# Patient Record
Sex: Male | Born: 1970 | Race: Black or African American | Hispanic: No | Smoking: Current every day smoker
Health system: Southern US, Community
[De-identification: ages and names within clinical notes are randomized; demographics above are authoritative.]

## PROBLEM LIST (undated history)

## (undated) DIAGNOSIS — Z992 Dependence on renal dialysis: Secondary | ICD-10-CM

## (undated) DIAGNOSIS — E119 Type 2 diabetes mellitus without complications: Secondary | ICD-10-CM

## (undated) DIAGNOSIS — I10 Essential (primary) hypertension: Secondary | ICD-10-CM

## (undated) DIAGNOSIS — I2510 Atherosclerotic heart disease of native coronary artery without angina pectoris: Secondary | ICD-10-CM

## (undated) DIAGNOSIS — I472 Ventricular tachycardia, unspecified: Secondary | ICD-10-CM

## (undated) DIAGNOSIS — N186 End stage renal disease: Secondary | ICD-10-CM

## (undated) DIAGNOSIS — K219 Gastro-esophageal reflux disease without esophagitis: Secondary | ICD-10-CM

## (undated) DIAGNOSIS — Z95810 Presence of automatic (implantable) cardiac defibrillator: Secondary | ICD-10-CM

## (undated) DIAGNOSIS — I255 Ischemic cardiomyopathy: Secondary | ICD-10-CM

## (undated) DIAGNOSIS — R519 Headache, unspecified: Secondary | ICD-10-CM

## (undated) DIAGNOSIS — R51 Headache: Secondary | ICD-10-CM

## (undated) DIAGNOSIS — I499 Cardiac arrhythmia, unspecified: Secondary | ICD-10-CM

## (undated) DIAGNOSIS — Z8719 Personal history of other diseases of the digestive system: Secondary | ICD-10-CM

## (undated) DIAGNOSIS — N189 Chronic kidney disease, unspecified: Secondary | ICD-10-CM

## (undated) HISTORY — DX: Chronic kidney disease, unspecified: N18.9

## (undated) HISTORY — PX: KNEE SURGERY: SHX244

## (undated) HISTORY — DX: Type 2 diabetes mellitus without complications: E11.9

## (undated) HISTORY — DX: Essential (primary) hypertension: I10

## (undated) NOTE — *Deleted (*Deleted)
Patient seen and disucssed with PA Ahmed Prima, I agree with her documentation. 56 yo male history of CAD with CABG in 2019, ESRD, DM2, HTN, poor compliance admitted from dialysis with afib with RVR. Has not been taking his av nodal agents at home.    K 4.8 Cr 12 BUN 39 Mg 2 BNP 1898  hstrop 520--> COVID neg CXR no acute process EKG afib, RVR Echo pending   Admitted with afib with RVR in setting of medication noncompliance. Starting oral dilt 30mg  every 6 hours and follow rates, can consolidated to long acting closer to discharge. Also on coreg 12.5mg  bid. Continue eliquis for stroke prevention.    Elevated trop in setting of ESRD and afib with RVR

---

## 2010-03-26 ENCOUNTER — Emergency Department (HOSPITAL_COMMUNITY): Admission: EM | Admit: 2010-03-26 | Discharge: 2010-03-26 | Payer: Self-pay | Admitting: Emergency Medicine

## 2011-03-25 LAB — COMPREHENSIVE METABOLIC PANEL
ALT: 14 U/L (ref 0–53)
AST: 15 U/L (ref 0–37)
Alkaline Phosphatase: 53 U/L (ref 39–117)
BUN: 15 mg/dL (ref 6–23)
GFR calc Af Amer: >60 mL/min (ref >60)
Sodium: 135 mEq/L (ref 135–145)

## 2011-03-25 LAB — D-DIMER, QUANTITATIVE: D-Dimer, Quant: 0.46 ug/mL-FEU (ref 0.00–0.48)

## 2011-03-25 LAB — GLUCOSE, CAPILLARY: Glucose-Capillary: 363 mg/dL — ABNORMAL HIGH (ref 70–99)

## 2011-03-25 LAB — CBC
HCT: 33.7 % — ABNORMAL LOW (ref 39.0–52.0)
Hemoglobin: 11.6 g/dL — ABNORMAL LOW (ref 13.0–17.0)
MCHC: 34.4 g/dL (ref 30.0–36.0)
Platelets: 189 10*3/uL (ref 150–400)
RBC: 4.45 MIL/uL (ref 4.22–5.81)
RDW: 13.4 % (ref 11.5–15.5)

## 2011-03-25 LAB — POCT CARDIAC MARKERS
CKMB, poc: <1 ng/mL — ABNORMAL LOW (ref 1.0–8.0)
Myoglobin, poc: 68.4 ng/mL (ref 12–200)

## 2011-03-25 LAB — DIFFERENTIAL
Basophils Relative: 0 % (ref 0–1)
Eosinophils Relative: 3 % (ref 0–5)
Lymphocytes Relative: 30 % (ref 12–46)
Neutrophils Relative %: 60 % (ref 43–77)

## 2015-02-17 ENCOUNTER — Encounter: Payer: Self-pay | Admitting: Vascular Surgery

## 2015-02-17 ENCOUNTER — Other Ambulatory Visit: Payer: Self-pay | Admitting: *Deleted

## 2015-02-17 DIAGNOSIS — N186 End stage renal disease: Secondary | ICD-10-CM

## 2015-02-17 DIAGNOSIS — Z01810 Encounter for preprocedural cardiovascular examination: Secondary | ICD-10-CM

## 2015-03-08 ENCOUNTER — Encounter: Payer: Self-pay | Admitting: Vascular Surgery

## 2015-03-09 ENCOUNTER — Ambulatory Visit (INDEPENDENT_AMBULATORY_CARE_PROVIDER_SITE_OTHER): Payer: Self-pay | Admitting: Vascular Surgery

## 2015-03-09 ENCOUNTER — Other Ambulatory Visit: Payer: Self-pay

## 2015-03-09 ENCOUNTER — Ambulatory Visit (INDEPENDENT_AMBULATORY_CARE_PROVIDER_SITE_OTHER)
Admission: RE | Admit: 2015-03-09 | Discharge: 2015-03-09 | Disposition: A | Discharge status: Home / Self Care | Payer: Medicaid - Out of State | Source: Ambulatory Visit | Attending: Vascular Surgery | Admitting: Vascular Surgery

## 2015-03-09 ENCOUNTER — Ambulatory Visit (HOSPITAL_COMMUNITY)
Admission: RE | Admit: 2015-03-09 | Discharge: 2015-03-09 | Disposition: A | Discharge status: Home / Self Care | Payer: Medicaid - Out of State | Source: Ambulatory Visit | Attending: Vascular Surgery | Admitting: Vascular Surgery

## 2015-03-09 ENCOUNTER — Encounter: Payer: Self-pay | Admitting: Vascular Surgery

## 2015-03-09 VITALS — BP 189/93 | HR 112 | Resp 16 | Ht 70.0 in | Wt 199.0 lb

## 2015-03-09 DIAGNOSIS — N186 End stage renal disease: Secondary | ICD-10-CM

## 2015-03-09 DIAGNOSIS — Z992 Dependence on renal dialysis: Secondary | ICD-10-CM | POA: Insufficient documentation

## 2015-03-09 DIAGNOSIS — Z01810 Encounter for preprocedural cardiovascular examination: Secondary | ICD-10-CM

## 2015-03-09 NOTE — Progress Notes (Signed)
[facility] [place] HISTORY AND PHYSICAL   History of Present Illness:  Patient is a 44 y.o. male who presents for placement of a permanent hemodialysis access. The patient is right handed.  The patient is currently on hemodialysis via a right-sided catheter.  The cause of renal failure is thought to be secondary to diabetes and hypertension.  These are both currently stable. He has had no other prior permanent hemodialysis access procedures. He does have some occasional right hand numbness currently.  Past Medical History  Diagnosis Date  . Chronic kidney disease   . Hypertension   . Diabetes mellitus without complication     Past Surgical History  Procedure Laterality Date  . Knee surgery       Social History History  Substance Use Topics  . Smoking status: Current Every Day Smoker  . Smokeless tobacco: Never Used  . Alcohol Use: 0.0 oz/week    0 Standard drinks or equivalent per week    Family History Family History  Problem Relation Age of Onset  . Hypertension Father   . Stroke Father   . Diabetes Mother     Allergies  No Known Allergies   No current outpatient prescriptions on file.   No current facility-administered medications for this visit.    ROS:   General:  No weight loss, Fever, chills  HEENT: No recent headaches, no nasal bleeding, no visual changes, no sore throat  Neurologic: No dizziness, blackouts, seizures. No recent symptoms of stroke or mini- stroke. No recent episodes of slurred speech, or temporary blindness.  Cardiac: No recent episodes of chest pain/pressure, no shortness of breath at rest.  No shortness of breath with exertion.  Denies history of atrial fibrillation or irregular heartbeat  Vascular: No history of rest pain in feet.  No history of claudication.  No history of non-healing ulcer, No history of DVT   Pulmonary: No home oxygen, no productive cough, no hemoptysis,  No asthma or  wheezing  Musculoskeletal:  [ ]  Arthritis, [ ]  Low back pain,  [ ]  Joint pain  Hematologic:No history of hypercoagulable state.  No history of easy bleeding.  No history of anemia  Gastrointestinal: No hematochezia or melena,  No gastroesophageal reflux, no trouble swallowing  Urinary: [ ]  chronic Kidney disease, [ ]  on HD - [ x] MWF or [ ]  TTHS, [ ]  Burning with urination, [ ]  Frequent urination, [ ]  Difficulty urinating;   Skin: No rashes  Psychological: No history of anxiety,  No history of depression   Physical Examination  Filed Vitals:   03/09/15 1437  BP: 189/93  Pulse: 112  Resp: 16  Height: 5\' 10"  (1.778 m)  Weight: 199 lb (90.266 kg)    Body mass index is 28.55 kg/(m^2).  General:  Alert and oriented, no acute distress HEENT: Normal Neck: No bruit or JVD Pulmonary: Clear to auscultation bilaterally Cardiac: Regular Rate and Rhythm without murmur Gastrointestinal: Soft, non-tender, non-distended, no mass Skin: No rash Extremity Pulses:  2+ radial, brachial pulses bilaterally Musculoskeletal: No deformity or edema  Neurologic: Upper and lower extremity motor 5/5 and symmetric  DATA: Patient had a vein mapping ultrasound today which showed thrombosis in the upper arm right cephalic vein basilic vein was 4-5 mm. On the left side there was a thrombosed area in the mid forearm of the cephalic vein antecubital area up was 4-5 mm diameter. Basilic vein on the left side was 4-5 mm. Arterial duplex was also performed which  showed triphasic waveforms throughout the brachial radial and ulnar arteries bilaterally. There is normal anatomic configuration.   ASSESSMENT: Patient needs long-term hemodialysis access. His anatomy is suitable for a left brachiocephalic AV fistula.   PLAN:  Left brachiocephalic AV fistula scheduled for 03/30/2015. Risks benefits possible complications and procedure details including but not limited to bleeding infection ischemic steal non-maturation  of the fistula works [place] the patient today. He understands and agrees to proceed.  Ruta Hinds, MD [facility] [place] Office: [phone] Pager: [phone]

## 2015-03-16 ENCOUNTER — Other Ambulatory Visit (HOSPITAL_COMMUNITY): Payer: Self-pay | Admitting: Internal Medicine

## 2015-03-16 DIAGNOSIS — M545 Low back pain, unspecified: Secondary | ICD-10-CM

## 2015-03-16 DIAGNOSIS — N08 Glomerular disorders in diseases classified elsewhere: Secondary | ICD-10-CM

## 2015-03-16 DIAGNOSIS — C9000 Multiple myeloma not having achieved remission: Secondary | ICD-10-CM

## 2015-03-29 ENCOUNTER — Encounter (HOSPITAL_COMMUNITY): Payer: Self-pay | Admitting: *Deleted

## 2015-03-29 MED ORDER — CHLORHEXIDINE GLUCONATE CLOTH 2 % EX PADS: 6.0000 | MEDICATED_PAD | Freq: Once | CUTANEOUS | Status: DC

## 2015-03-29 MED ORDER — DEXTROSE 5 % IV SOLN
1.5000 g | INTRAVENOUS | Status: AC
  Administered 2015-03-30: 1.5 g via INTRAVENOUS
  Filled 2015-03-29: qty 1.5

## 2015-03-29 MED ORDER — SODIUM CHLORIDE 0.9 % IV SOLN: INTRAVENOUS | Status: DC

## 2015-03-29 NOTE — Progress Notes (Signed)
   03/29/15 1256  OBSTRUCTIVE SLEEP APNEA  Have you ever been diagnosed with sleep apnea through a sleep study? No  Do you snore loudly (loud enough to be heard through closed doors)?  1  Do you often feel tired, fatigued, or sleepy during the daytime? 1  Has anyone observed you stop breathing during your sleep? 0  Do you have, or are you being treated for high blood pressure? 1  BMI more than 35 kg/m2? 0  Age over 43 years old? 0  Gender: 1  Obstructive Sleep Apnea Score 4

## 2015-03-29 NOTE — Progress Notes (Signed)
Pt consented for spouse Estill Bamberg to complete SDW-pre-op call. Please assess for SOB, chest pain and anesthesia complications on DOS. According to spouse, pt has no PCP. Pt spouse made aware to have pt not take any diabetic medications the morning of surgeryand to not take any otc vitamins, herbal medications and  NSAIDS.

## 2015-03-30 ENCOUNTER — Encounter (HOSPITAL_COMMUNITY)
Admission: RE | Disposition: A | Discharge status: Home / Self Care | Payer: Self-pay | Source: Ambulatory Visit | Attending: Vascular Surgery

## 2015-03-30 ENCOUNTER — Ambulatory Visit (HOSPITAL_COMMUNITY): Payer: MEDICAID | Admitting: Anesthesiology

## 2015-03-30 ENCOUNTER — Encounter (HOSPITAL_COMMUNITY): Payer: Self-pay | Admitting: Anesthesiology

## 2015-03-30 ENCOUNTER — Ambulatory Visit (HOSPITAL_COMMUNITY)
Admission: RE | Admit: 2015-03-30 | Discharge: 2015-03-30 | Disposition: A | Discharge status: Home / Self Care | Payer: MEDICAID | Source: Ambulatory Visit | Attending: Vascular Surgery | Admitting: Vascular Surgery

## 2015-03-30 DIAGNOSIS — E119 Type 2 diabetes mellitus without complications: Secondary | ICD-10-CM | POA: Insufficient documentation

## 2015-03-30 DIAGNOSIS — I120 Hypertensive chronic kidney disease with stage 5 chronic kidney disease or end stage renal disease: Secondary | ICD-10-CM | POA: Diagnosis not present

## 2015-03-30 DIAGNOSIS — K219 Gastro-esophageal reflux disease without esophagitis: Secondary | ICD-10-CM | POA: Insufficient documentation

## 2015-03-30 DIAGNOSIS — Z992 Dependence on renal dialysis: Secondary | ICD-10-CM | POA: Insufficient documentation

## 2015-03-30 DIAGNOSIS — F17210 Nicotine dependence, cigarettes, uncomplicated: Secondary | ICD-10-CM | POA: Diagnosis not present

## 2015-03-30 DIAGNOSIS — N185 Chronic kidney disease, stage 5: Secondary | ICD-10-CM

## 2015-03-30 DIAGNOSIS — I493 Ventricular premature depolarization: Secondary | ICD-10-CM | POA: Diagnosis not present

## 2015-03-30 HISTORY — DX: Headache, unspecified: R51.9

## 2015-03-30 HISTORY — DX: Headache: R51

## 2015-03-30 HISTORY — DX: Gastro-esophageal reflux disease without esophagitis: K21.9

## 2015-03-30 HISTORY — PX: AV FISTULA PLACEMENT: SHX1204

## 2015-03-30 LAB — POCT I-STAT 4, (NA,K, GLUC, HGB,HCT)
GLUCOSE: 119 mg/dL — AB (ref 70–99)
HCT: 38 % — ABNORMAL LOW (ref 39.0–52.0)
HEMOGLOBIN: 12.9 g/dL — AB (ref 13.0–17.0)
POTASSIUM: 3.1 mmol/L — AB (ref 3.5–5.1)
Sodium: 139 mmol/L (ref 135–145)

## 2015-03-30 LAB — GLUCOSE, CAPILLARY
GLUCOSE-CAPILLARY: 109 mg/dL — AB (ref 70–99)
Glucose-Capillary: 110 mg/dL — ABNORMAL HIGH (ref 70–99)

## 2015-03-30 SURGERY — ARTERIOVENOUS (AV) FISTULA CREATION
Anesthesia: Monitor Anesthesia Care | Site: Arm Upper | Laterality: Left

## 2015-03-30 MED ORDER — 0.9 % SODIUM CHLORIDE (POUR BTL) OPTIME
TOPICAL | Status: DC | PRN
  Administered 2015-03-30: 1000 mL

## 2015-03-30 MED ORDER — HYDROMORPHONE HCL 1 MG/ML IJ SOLN: 0.2500 mg | INTRAMUSCULAR | Status: DC | PRN

## 2015-03-30 MED ORDER — ONDANSETRON HCL 4 MG/2ML IJ SOLN
INTRAMUSCULAR | Status: AC
  Filled 2015-03-30: qty 2

## 2015-03-30 MED ORDER — MIDAZOLAM HCL 5 MG/5ML IJ SOLN
INTRAMUSCULAR | Status: DC | PRN
  Administered 2015-03-30 (×2): 1 mg via INTRAVENOUS

## 2015-03-30 MED ORDER — HYDRALAZINE HCL 20 MG/ML IJ SOLN
5.0000 mg | Freq: Once | INTRAMUSCULAR | Status: AC
  Administered 2015-03-30: 5 mg via INTRAVENOUS

## 2015-03-30 MED ORDER — OXYCODONE HCL 5 MG/5ML PO SOLN: 5.0000 mg | Freq: Once | ORAL | Status: DC | PRN

## 2015-03-30 MED ORDER — FENTANYL CITRATE 0.05 MG/ML IJ SOLN
INTRAMUSCULAR | Status: DC | PRN
  Administered 2015-03-30 (×3): 50 ug via INTRAVENOUS

## 2015-03-30 MED ORDER — MIDAZOLAM HCL 2 MG/2ML IJ SOLN
INTRAMUSCULAR | Status: AC
  Filled 2015-03-30: qty 2

## 2015-03-30 MED ORDER — HEPARIN SODIUM (PORCINE) 1000 UNIT/ML IJ SOLN
INTRAMUSCULAR | Status: AC
  Filled 2015-03-30: qty 1

## 2015-03-30 MED ORDER — LIDOCAINE HCL (PF) 1 % IJ SOLN
INTRAMUSCULAR | Status: AC
  Filled 2015-03-30: qty 30

## 2015-03-30 MED ORDER — PROPOFOL 10 MG/ML IV BOLUS
INTRAVENOUS | Status: AC
  Filled 2015-03-30: qty 20

## 2015-03-30 MED ORDER — PROPOFOL INFUSION 10 MG/ML OPTIME
INTRAVENOUS | Status: DC | PRN
  Administered 2015-03-30: 50 ug/kg/min via INTRAVENOUS

## 2015-03-30 MED ORDER — THROMBIN 20000 UNITS EX SOLR
CUTANEOUS | Status: AC
  Filled 2015-03-30: qty 20000

## 2015-03-30 MED ORDER — OXYCODONE HCL 5 MG PO TABS: 5.0000 mg | ORAL_TABLET | Freq: Once | ORAL | Status: DC | PRN

## 2015-03-30 MED ORDER — HYDRALAZINE HCL 20 MG/ML IJ SOLN
INTRAMUSCULAR | Status: DC | PRN
  Administered 2015-03-30 (×2): 5 mg via INTRAVENOUS

## 2015-03-30 MED ORDER — PROTAMINE SULFATE 10 MG/ML IV SOLN
INTRAVENOUS | Status: AC
  Filled 2015-03-30: qty 5

## 2015-03-30 MED ORDER — SODIUM CHLORIDE 0.9 % IR SOLN
Status: DC | PRN
  Administered 2015-03-30: 500 mL

## 2015-03-30 MED ORDER — DEXTROSE 5 % IV SOLN
INTRAVENOUS | Status: DC | PRN
  Administered 2015-03-30: 08:00:00 via INTRAVENOUS

## 2015-03-30 MED ORDER — PROMETHAZINE HCL 25 MG/ML IJ SOLN: 6.2500 mg | INTRAMUSCULAR | Status: DC | PRN

## 2015-03-30 MED ORDER — FENTANYL CITRATE 0.05 MG/ML IJ SOLN
INTRAMUSCULAR | Status: AC
  Filled 2015-03-30: qty 5

## 2015-03-30 MED ORDER — HEPARIN SODIUM (PORCINE) 1000 UNIT/ML IJ SOLN
INTRAMUSCULAR | Status: DC | PRN
  Administered 2015-03-30: 7000 [IU] via INTRAVENOUS

## 2015-03-30 MED ORDER — SODIUM CHLORIDE 0.9 % IV SOLN
INTRAVENOUS | Status: DC | PRN
  Administered 2015-03-30 (×2): via INTRAVENOUS

## 2015-03-30 MED ORDER — HYDRALAZINE HCL 20 MG/ML IJ SOLN
INTRAMUSCULAR | Status: AC
  Filled 2015-03-30: qty 1

## 2015-03-30 MED ORDER — ONDANSETRON HCL 4 MG/2ML IJ SOLN
INTRAMUSCULAR | Status: DC | PRN
  Administered 2015-03-30: 4 mg via INTRAVENOUS

## 2015-03-30 MED ORDER — LIDOCAINE HCL (PF) 1 % IJ SOLN
INTRAMUSCULAR | Status: DC | PRN
  Administered 2015-03-30: 18 mL

## 2015-03-30 MED ORDER — OXYCODONE HCL 5 MG PO TABS: 5.0000 mg | ORAL_TABLET | Freq: Four times a day (QID) | ORAL | Status: DC | PRN

## 2015-03-30 SURGICAL SUPPLY — 34 items
ARMBAND PINK RESTRICT EXTREMIT (MISCELLANEOUS) ×2 IMPLANT
CANISTER SUCTION 2500CC (MISCELLANEOUS) ×2 IMPLANT
CANNULA VESSEL 3MM 2 BLNT TIP (CANNULA) ×2 IMPLANT
CLIP TI MEDIUM 6 (CLIP) ×2 IMPLANT
CLIP TI WIDE RED SMALL 6 (CLIP) ×2 IMPLANT
COVER PROBE W GEL 5X96 (DRAPES) IMPLANT
DECANTER SPIKE VIAL GLASS SM (MISCELLANEOUS) ×2 IMPLANT
DRAIN PENROSE 1/4X12 LTX STRL (WOUND CARE) ×2 IMPLANT
ELECT REM PT RETURN 9FT ADLT (ELECTROSURGICAL) ×2
ELECTRODE REM PT RTRN 9FT ADLT (ELECTROSURGICAL) ×1 IMPLANT
GLOVE BIO SURGEON STRL SZ 6.5 (GLOVE) ×4 IMPLANT
GLOVE BIO SURGEON STRL SZ7.5 (GLOVE) ×2 IMPLANT
GLOVE BIOGEL PI IND STRL 6.5 (GLOVE) ×2 IMPLANT
GLOVE BIOGEL PI INDICATOR 6.5 (GLOVE) ×2
GLOVE SURG SS PI 7.0 STRL IVOR (GLOVE) ×2 IMPLANT
GOWN STRL REUS W/ TWL LRG LVL3 (GOWN DISPOSABLE) ×2 IMPLANT
GOWN STRL REUS W/ TWL XL LVL3 (GOWN DISPOSABLE) ×1 IMPLANT
GOWN STRL REUS W/TWL LRG LVL3 (GOWN DISPOSABLE) ×2
GOWN STRL REUS W/TWL XL LVL3 (GOWN DISPOSABLE) ×1
KIT BASIN OR (CUSTOM PROCEDURE TRAY) ×2 IMPLANT
KIT ROOM TURNOVER OR (KITS) ×2 IMPLANT
LIQUID BAND (GAUZE/BANDAGES/DRESSINGS) ×2 IMPLANT
LOOP VESSEL MINI RED (MISCELLANEOUS) IMPLANT
NS IRRIG 1000ML POUR BTL (IV SOLUTION) ×2 IMPLANT
PACK CV ACCESS (CUSTOM PROCEDURE TRAY) ×2 IMPLANT
PAD ARMBOARD 7.5X6 YLW CONV (MISCELLANEOUS) ×4 IMPLANT
SPONGE SURGIFOAM ABS GEL 100 (HEMOSTASIS) IMPLANT
SUT PROLENE 6 0 BV (SUTURE) ×2 IMPLANT
SUT PROLENE 7 0 BV 1 (SUTURE) IMPLANT
SUT VIC AB 3-0 SH 27 (SUTURE) ×1
SUT VIC AB 3-0 SH 27X BRD (SUTURE) ×1 IMPLANT
SUT VICRYL 4-0 PS2 18IN ABS (SUTURE) ×2 IMPLANT
UNDERPAD 30X30 INCONTINENT (UNDERPADS AND DIAPERS) ×2 IMPLANT
WATER STERILE IRR 1000ML POUR (IV SOLUTION) IMPLANT

## 2015-03-30 NOTE — Interval H&P Note (Signed)
History and Physical Interval Note:  03/30/2015 7:21 AM  Shaun Burns  has presented today for surgery, with the diagnosis of End Stage Renal Disease N18.6  The various methods of treatment have been discussed with the patient and family. After consideration of risks, benefits and other options for treatment, the patient has consented to  Procedure(s): BRACHIOCEPHALIC ARTERIOVENOUS (AV) FISTULA CREATION (Left) as a surgical intervention .  The patient's history has been reviewed, patient examined, no change in status, stable for surgery.  I have reviewed the patient's chart and labs.  Questions were answered to the patient's satisfaction.     FIELDS,CHARLES E

## 2015-03-30 NOTE — Progress Notes (Signed)
Report to Dr. Orene Desanctis re: BP's this a.m., also, reported that pt. C/o pain in his back, (pt. Reporting that he thinks his BP is up relative to his pain) .  Pt. Reports that Renal MD told him to take Labetalol x2 200mg . In the a.m. Starting today, although pt. Failed to do that. Pt. Took Labetalol 200mg . Today.

## 2015-03-30 NOTE — Anesthesia Postprocedure Evaluation (Signed)
  Anesthesia Post-op Note  Patient: Shaun Burns  Procedure(s) Performed: Procedure(s): LEFT BRACHIOCEPHALIC ARTERIOVENOUS (AV) FISTULA CREATION (Left)  Patient Location: PACU  Anesthesia Type:MAC  Level of Consciousness: awake and alert   Airway and Oxygen Therapy: Patient Spontanous Breathing  Post-op Pain: none  Post-op Assessment: Post-op Vital signs reviewed and Patient's Cardiovascular Status Stable  Post-op Vital Signs: stable  Last Vitals:  Filed Vitals:   03/30/15 1002  BP: 144/74  Pulse: 72  Temp:   Resp: 12    Complications: No apparent anesthesia complications

## 2015-03-30 NOTE — Transfer of Care (Signed)
Immediate Anesthesia Transfer of Care Note  Patient: Shaun Burns  Procedure(s) Performed: Procedure(s): LEFT BRACHIOCEPHALIC ARTERIOVENOUS (AV) FISTULA CREATION (Left)  Patient Location: PACU  Anesthesia Type:MAC  Level of Consciousness: awake, alert , oriented and patient cooperative  Airway & Oxygen Therapy: Patient Spontanous Breathing and Patient connected to nasal cannula oxygen  Post-op Assessment: Report given to RN, Post -op Vital signs reviewed and stable and Patient moving all extremities  Post vital signs: Reviewed and stable  Last Vitals:  Filed Vitals:   03/30/15 0917  BP: 125/69  Pulse: 78  Temp:   Resp:     Complications: No apparent anesthesia complications

## 2015-03-30 NOTE — Progress Notes (Signed)
Pt took his BP med, stated it's" labetalol 200mg " , He stated he was told by his "dialysis Dr". To increase from one tablet twice daily to two tablets in the morning and one at night.

## 2015-03-30 NOTE — Discharge Instructions (Signed)
° ° °  03/30/2015 Shaun Burns LD:[id number] [birth year]  Surgeon(s): Elam Dutch, MD  Procedure(s): LEFT BRACHIOCEPHALIC ARTERIOVENOUS (AV) FISTULA CREATION  x Do not stick fistula for 12 weeks

## 2015-03-30 NOTE — Anesthesia Preprocedure Evaluation (Addendum)
Anesthesia Evaluation  Patient identified by MRN, date of birth, ID band Patient awake    Reviewed: Allergy & Precautions, NPO status , Patient's Chart, lab work & pertinent test results, reviewed documented beta blocker date and time   Airway Mallampati: II  TM Distance: >3 FB Neck ROM: Full    Dental  (+) Teeth Intact, Dental Advisory Given   Pulmonary Current Smoker,  breath sounds clear to auscultation        Cardiovascular hypertension, Pt. on home beta blockers Rhythm:Regular Rate:Normal  Pt took [place] at 6 a.m. today   Neuro/Psych    GI/Hepatic   Endo/Other  diabetes, Well Controlled, Type 2, Oral Hypoglycemic Agents  Renal/GU Renal Insufficiency, CRF and DialysisRenal diseaseHD the last two months via diatek right chest, [name]     Musculoskeletal   Abdominal   Peds  Hematology   Anesthesia Other Findings   Reproductive/Obstetrics                          Anesthesia Physical Anesthesia Plan  ASA: III  Anesthesia Plan: MAC   Post-op Pain Management:    Induction: Intravenous  Airway Management Planned: Natural Airway and Simple Face Mask  Additional Equipment:   Intra-op Plan:   Post-operative Plan:   Informed Consent: I have reviewed the patients History and Physical, chart, labs and discussed the procedure including the risks, benefits and alternatives for the proposed anesthesia with the patient or authorized representative who has indicated his/her understanding and acceptance.   Dental advisory given  Plan Discussed with: CRNA and Surgeon  Anesthesia Plan Comments:         Anesthesia Quick Evaluation

## 2015-03-30 NOTE — Op Note (Addendum)
Procedure: Left Brachial Cephalic AV fistula  Preop: ESRD  Postop: ESRD  Anesthesia: Local with IV sedation  Assistant:Samantha Rhyne PA-c  Findings: 3.5 mm cephalic vein  Procedure: After obtaining informed consent, the patient was taken to the operating room.  After adequate sedation, the left upper extremity was prepped and draped in usual sterile fashion.  Local anesthesia was infiltrated near the antecubital crease.  A transverse incision was then made near the antecubital crease the left arm. The incision was carried into the subcutaneous tissues down to level of the cephalic vein. The cephalic vein was approximately 3.5 mm in diameter. It was of good quality. This was dissected free circumferentially and small side branches ligated and divided between silk ties or clips. Next the brachial artery was dissected free in the medial portion of the incision. The artery was  3-4 mm in diameter. The vessel loops were placed proximal and distal to the planned site of arteriotomy. The patient was given 7000 units of intravenous heparin. After appropriate circulation time, the vessel loops were used to control the artery. A longitudinal opening was made in the brachial artery.  The vein was ligated distally with a 3-0 silk tie. The vein was controlled proximally with a fine bulldog clamp. The vein was then swung over to the artery and sewn end of vein to side of artery using a running 6-0 Prolene suture. Just prior to completion of the anastomosis, everything was fore bled back bled and thoroughly flushed. The anastomosis was secured, vessel loops released, and there was a palpable thrill in the fistula immediately. After hemostasis was obtained, the subcutaneous tissues were reapproximated using a running 3-0 Vicryl suture. The skin was then closed with a 4 Vicryl subcuticular stitch. Liquidband was applied to the skin incision.  The patient had an audible radial doppler signal at the end of the  case.  Ruta Hinds, MD [facility] [place] Office: [phone] Pager: [phone]

## 2015-03-30 NOTE — Addendum Note (Signed)
Addendum  created 03/30/15 1412 by Terrill Mohr, CRNA   Modules edited: Anesthesia Medication Administration

## 2015-03-30 NOTE — H&P (View-Only) (Signed)
[facility] [place] HISTORY AND PHYSICAL   History of Present Illness:  Patient is a 44 y.o. male who presents for placement of a permanent hemodialysis access. The patient is right handed.  The patient is currently on hemodialysis via a right-sided catheter.  The cause of renal failure is thought to be secondary to diabetes and hypertension.  These are both currently stable. He has had no other prior permanent hemodialysis access procedures. He does have some occasional right hand numbness currently.  Past Medical History  Diagnosis Date  . Chronic kidney disease   . Hypertension   . Diabetes mellitus without complication     Past Surgical History  Procedure Laterality Date  . Knee surgery       Social History History  Substance Use Topics  . Smoking status: Current Every Day Smoker  . Smokeless tobacco: Never Used  . Alcohol Use: 0.0 oz/week    0 Standard drinks or equivalent per week    Family History Family History  Problem Relation Age of Onset  . Hypertension Father   . Stroke Father   . Diabetes Mother     Allergies  No Known Allergies   No current outpatient prescriptions on file.   No current facility-administered medications for this visit.    ROS:   General:  No weight loss, Fever, chills  HEENT: No recent headaches, no nasal bleeding, no visual changes, no sore throat  Neurologic: No dizziness, blackouts, seizures. No recent symptoms of stroke or mini- stroke. No recent episodes of slurred speech, or temporary blindness.  Cardiac: No recent episodes of chest pain/pressure, no shortness of breath at rest.  No shortness of breath with exertion.  Denies history of atrial fibrillation or irregular heartbeat  Vascular: No history of rest pain in feet.  No history of claudication.  No history of non-healing ulcer, No history of DVT   Pulmonary: No home oxygen, no productive cough, no hemoptysis,  No asthma or  wheezing  Musculoskeletal:  [ ]  Arthritis, [ ]  Low back pain,  [ ]  Joint pain  Hematologic:No history of hypercoagulable state.  No history of easy bleeding.  No history of anemia  Gastrointestinal: No hematochezia or melena,  No gastroesophageal reflux, no trouble swallowing  Urinary: [ ]  chronic Kidney disease, [ ]  on HD - [ x] MWF or [ ]  TTHS, [ ]  Burning with urination, [ ]  Frequent urination, [ ]  Difficulty urinating;   Skin: No rashes  Psychological: No history of anxiety,  No history of depression   Physical Examination  Filed Vitals:   03/09/15 1437  BP: 189/93  Pulse: 112  Resp: 16  Height: 5\' 10"  (1.778 m)  Weight: 199 lb (90.266 kg)    Body mass index is 28.55 kg/(m^2).  General:  Alert and oriented, no acute distress HEENT: Normal Neck: No bruit or JVD Pulmonary: Clear to auscultation bilaterally Cardiac: Regular Rate and Rhythm without murmur Gastrointestinal: Soft, non-tender, non-distended, no mass Skin: No rash Extremity Pulses:  2+ radial, brachial pulses bilaterally Musculoskeletal: No deformity or edema  Neurologic: Upper and lower extremity motor 5/5 and symmetric  DATA: Patient had a vein mapping ultrasound today which showed thrombosis in the upper arm right cephalic vein basilic vein was 4-5 mm. On the left side there was a thrombosed area in the mid forearm of the cephalic vein antecubital area up was 4-5 mm diameter. Basilic vein on the left side was 4-5 mm. Arterial duplex was also performed which  showed triphasic waveforms throughout the brachial radial and ulnar arteries bilaterally. There is normal anatomic configuration.   ASSESSMENT: Patient needs long-term hemodialysis access. His anatomy is suitable for a left brachiocephalic AV fistula.   PLAN:  Left brachiocephalic AV fistula scheduled for 03/30/2015. Risks benefits possible complications and procedure details including but not limited to bleeding infection ischemic steal non-maturation  of the fistula works [place] the patient today. He understands and agrees to proceed.  Ruta Hinds, MD [facility] [place] Office: [phone] Pager: [phone]

## 2015-04-03 ENCOUNTER — Encounter (HOSPITAL_COMMUNITY): Payer: Self-pay | Admitting: Vascular Surgery

## 2015-04-04 ENCOUNTER — Ambulatory Visit (HOSPITAL_COMMUNITY): Payer: Self-pay

## 2015-04-18 ENCOUNTER — Encounter (HOSPITAL_COMMUNITY): Admission: RE | Admit: 2015-04-18 | Payer: Medicaid - Out of State | Source: Ambulatory Visit

## 2015-04-20 ENCOUNTER — Other Ambulatory Visit: Payer: Self-pay | Admitting: *Deleted

## 2015-04-20 DIAGNOSIS — Z4931 Encounter for adequacy testing for hemodialysis: Secondary | ICD-10-CM

## 2015-04-20 DIAGNOSIS — N186 End stage renal disease: Secondary | ICD-10-CM

## 2015-04-27 ENCOUNTER — Encounter (HOSPITAL_COMMUNITY)
Admission: RE | Admit: 2015-04-27 | Discharge: 2015-04-27 | Disposition: A | Discharge status: Home / Self Care | Payer: Medicaid - Out of State | Source: Ambulatory Visit | Attending: Internal Medicine | Admitting: Internal Medicine

## 2015-04-27 DIAGNOSIS — M545 Low back pain, unspecified: Secondary | ICD-10-CM

## 2015-04-27 DIAGNOSIS — N2889 Other specified disorders of kidney and ureter: Secondary | ICD-10-CM | POA: Diagnosis present

## 2015-04-27 DIAGNOSIS — N08 Glomerular disorders in diseases classified elsewhere: Secondary | ICD-10-CM

## 2015-04-27 DIAGNOSIS — C9000 Multiple myeloma not having achieved remission: Secondary | ICD-10-CM

## 2015-04-27 LAB — GLUCOSE, CAPILLARY: Glucose-Capillary: 139 mg/dL — ABNORMAL HIGH (ref 70–99)

## 2015-04-27 MED ORDER — FLUDEOXYGLUCOSE F - 18 (FDG) INJECTION
10.0200 | Freq: Once | INTRAVENOUS | Status: AC | PRN
  Administered 2015-04-27: 10.02 via INTRAVENOUS

## 2015-05-11 ENCOUNTER — Encounter (INDEPENDENT_AMBULATORY_CARE_PROVIDER_SITE_OTHER): Payer: Self-pay | Admitting: Ophthalmology

## 2015-05-16 ENCOUNTER — Encounter: Payer: Self-pay | Admitting: Vascular Surgery

## 2015-05-18 ENCOUNTER — Encounter: Payer: Self-pay | Admitting: Vascular Surgery

## 2015-05-18 ENCOUNTER — Encounter (HOSPITAL_COMMUNITY): Payer: Self-pay

## 2015-06-14 ENCOUNTER — Encounter (INDEPENDENT_AMBULATORY_CARE_PROVIDER_SITE_OTHER): Payer: Medicare Other | Admitting: Ophthalmology

## 2015-06-14 DIAGNOSIS — I10 Essential (primary) hypertension: Secondary | ICD-10-CM

## 2015-06-14 DIAGNOSIS — E11341 Type 2 diabetes mellitus with severe nonproliferative diabetic retinopathy with macular edema: Secondary | ICD-10-CM | POA: Diagnosis not present

## 2015-06-14 DIAGNOSIS — H2513 Age-related nuclear cataract, bilateral: Secondary | ICD-10-CM

## 2015-06-14 DIAGNOSIS — H43813 Vitreous degeneration, bilateral: Secondary | ICD-10-CM

## 2015-06-14 DIAGNOSIS — E11311 Type 2 diabetes mellitus with unspecified diabetic retinopathy with macular edema: Secondary | ICD-10-CM | POA: Diagnosis not present

## 2015-06-14 DIAGNOSIS — H35033 Hypertensive retinopathy, bilateral: Secondary | ICD-10-CM | POA: Diagnosis not present

## 2015-06-28 ENCOUNTER — Encounter: Payer: Self-pay | Admitting: Vascular Surgery

## 2015-06-29 ENCOUNTER — Ambulatory Visit (INDEPENDENT_AMBULATORY_CARE_PROVIDER_SITE_OTHER): Payer: Self-pay | Admitting: Vascular Surgery

## 2015-06-29 ENCOUNTER — Ambulatory Visit (HOSPITAL_COMMUNITY)
Admission: RE | Admit: 2015-06-29 | Discharge: 2015-06-29 | Disposition: A | Discharge status: Home / Self Care | Payer: Medicare Other | Source: Ambulatory Visit | Attending: Vascular Surgery | Admitting: Vascular Surgery

## 2015-06-29 ENCOUNTER — Encounter: Payer: Self-pay | Admitting: Vascular Surgery

## 2015-06-29 ENCOUNTER — Other Ambulatory Visit: Payer: Self-pay | Admitting: Vascular Surgery

## 2015-06-29 VITALS — BP 174/92 | HR 74 | Resp 18 | Ht 70.0 in | Wt 186.0 lb

## 2015-06-29 DIAGNOSIS — Z4931 Encounter for adequacy testing for hemodialysis: Secondary | ICD-10-CM | POA: Diagnosis present

## 2015-06-29 DIAGNOSIS — N186 End stage renal disease: Secondary | ICD-10-CM

## 2015-06-29 DIAGNOSIS — Z992 Dependence on renal dialysis: Secondary | ICD-10-CM

## 2015-06-29 NOTE — Progress Notes (Signed)
Filed Vitals:   06/29/15 1420 06/29/15 1424  BP: 172/91 174/92  Pulse: 74 74  Resp: 18   Height: 5\' 10"  (1.778 m)   Weight: 186 lb (84.369 kg)

## 2015-06-29 NOTE — Progress Notes (Signed)
Patient is a 44 year old male who returns for follow-up today. He had a left brachiocephalic AV fistula placed on 03/30/2015. He denies any symptoms of numbness or tingling in his hand. He is currently dialyzing via a right-sided catheter.  Physical exam:  Filed Vitals:   06/29/15 1420 06/29/15 1424  BP: 172/91 174/92  Pulse: 74 74  Resp: 18   Height: 5\' 10"  (1.778 m)   Weight: 186 lb (84.369 kg)     Left upper extremity: Well-healed incision palpable thrill in fistula fistula was visible beneath the skin surface.  Data: Duplex ultrasound was performed AV fistula today. This showed it was 7 mm in diameter less than 3 mm from the skin surface  Assessment: Maturing left arm AV fistula  Plan: Start cannulation at this point. Patient will follow-up on as-needed basis. Patient was encouraged to follow up with his primary care physician regarding hypertension.  Ruta Hinds, MD [facility] [place] Office: [phone] Pager: [phone]

## 2015-07-12 ENCOUNTER — Encounter (INDEPENDENT_AMBULATORY_CARE_PROVIDER_SITE_OTHER): Payer: Medicare Other | Admitting: Ophthalmology

## 2015-07-12 DIAGNOSIS — E11341 Type 2 diabetes mellitus with severe nonproliferative diabetic retinopathy with macular edema: Secondary | ICD-10-CM

## 2015-07-12 DIAGNOSIS — E11311 Type 2 diabetes mellitus with unspecified diabetic retinopathy with macular edema: Secondary | ICD-10-CM | POA: Diagnosis not present

## 2015-07-12 DIAGNOSIS — I10 Essential (primary) hypertension: Secondary | ICD-10-CM

## 2015-07-12 DIAGNOSIS — H43813 Vitreous degeneration, bilateral: Secondary | ICD-10-CM

## 2015-07-12 DIAGNOSIS — H35033 Hypertensive retinopathy, bilateral: Secondary | ICD-10-CM

## 2015-08-09 ENCOUNTER — Encounter (INDEPENDENT_AMBULATORY_CARE_PROVIDER_SITE_OTHER): Payer: Medicare Other | Admitting: Ophthalmology

## 2015-08-09 DIAGNOSIS — E11319 Type 2 diabetes mellitus with unspecified diabetic retinopathy without macular edema: Secondary | ICD-10-CM

## 2015-08-09 DIAGNOSIS — I10 Essential (primary) hypertension: Secondary | ICD-10-CM

## 2015-08-09 DIAGNOSIS — E11331 Type 2 diabetes mellitus with moderate nonproliferative diabetic retinopathy with macular edema: Secondary | ICD-10-CM

## 2015-08-09 DIAGNOSIS — H43813 Vitreous degeneration, bilateral: Secondary | ICD-10-CM

## 2015-08-09 DIAGNOSIS — E11341 Type 2 diabetes mellitus with severe nonproliferative diabetic retinopathy with macular edema: Secondary | ICD-10-CM | POA: Diagnosis not present

## 2015-08-09 DIAGNOSIS — H35033 Hypertensive retinopathy, bilateral: Secondary | ICD-10-CM

## 2015-08-09 DIAGNOSIS — H2513 Age-related nuclear cataract, bilateral: Secondary | ICD-10-CM | POA: Diagnosis not present

## 2015-09-06 ENCOUNTER — Encounter (INDEPENDENT_AMBULATORY_CARE_PROVIDER_SITE_OTHER): Payer: Medicare Other | Admitting: Ophthalmology

## 2016-01-02 ENCOUNTER — Other Ambulatory Visit: Payer: Self-pay

## 2016-01-04 ENCOUNTER — Other Ambulatory Visit: Payer: Self-pay

## 2016-01-04 ENCOUNTER — Encounter (HOSPITAL_COMMUNITY)
Admission: RE | Disposition: A | Discharge status: Home / Self Care | Payer: Self-pay | Source: Ambulatory Visit | Attending: Vascular Surgery

## 2016-01-04 ENCOUNTER — Ambulatory Visit (HOSPITAL_COMMUNITY)
Admission: RE | Admit: 2016-01-04 | Discharge: 2016-01-04 | Disposition: A | Discharge status: Home / Self Care | Payer: Medicare Other | Source: Ambulatory Visit | Attending: Vascular Surgery | Admitting: Vascular Surgery

## 2016-01-04 DIAGNOSIS — Z992 Dependence on renal dialysis: Secondary | ICD-10-CM

## 2016-01-04 DIAGNOSIS — N186 End stage renal disease: Secondary | ICD-10-CM

## 2016-01-04 SURGERY — A/V SHUNTOGRAM/FISTULAGRAM

## 2016-01-04 NOTE — H&P (Signed)
    Brief History and Physical   History of Present Illness  Shaun Burns is a 45 y.o. male who presents with chief complaint: left arm pain on hemodialysis.  He notes alarms running during his HD runs.  The patient presents today for L arm fistulogram, possible intervention..    Past Medical History  Diagnosis Date  . Chronic kidney disease   . Hypertension   . Diabetes mellitus without complication   . GERD (gastroesophageal reflux disease)   . Headache     Past Surgical History  Procedure Laterality Date  . Knee surgery    . Av fistula placement Left 03/30/2015    Procedure: LEFT BRACHIOCEPHALIC ARTERIOVENOUS (AV) FISTULA CREATION;  Surgeon: Elam Dutch, MD;  Location: [facility] OR;  Service: Vascular;  Laterality: Left;    Social History   Social History  . Marital Status: Widowed    Spouse Name: N/A  . Number of Children: N/A  . Years of Education: N/A   Occupational History  . Not on file.   Social History Main Topics  . Smoking status: Current Every Day Smoker -- 0.50 packs/day    Types: Cigarettes  . Smokeless tobacco: Never Used  . Alcohol Use: No  . Drug Use: No  . Sexual Activity: Not on file   Other Topics Concern  . Not on file   Social History Narrative    Family History  Problem Relation Age of Onset  . Hypertension Father   . Stroke Father   . Diabetes Mother     No current facility-administered medications on file prior to encounter.   Current Outpatient Prescriptions on File Prior to Encounter  Medication Sig Dispense Refill  . furosemide (LASIX) 40 MG tablet Take 80 mg by mouth every morning.    . labetalol (NORMODYNE) 200 MG tablet Take 400 mg by mouth daily.       No Known Allergies  Review of Systems: As listed above, otherwise negative.  Physical Examination  There were no vitals filed for this visit.  General: A&O x 3, WDWN  Pulmonary: Sym exp, good air movt, CTAB, no rales, rhonchi, & wheezing  Cardiac: RRR, Nl S1,  S2, no Murmurs, rubs or gallops  Gastrointestinal: soft, NTND, -G/R, - HSM, - masses, - CVAT B  Musculoskeletal: M/S 5/5 throughout, Extremities without ischemic changes , palpable thrill in L upper arm, +bruit  Laboratory See iStat  Medical Decision Making  Shaun Burns is a 45 y.o. male who presents with: L BC AVF malfunction possible venous stenosis   The patient is scheduled for: L arm fistulogram, possible intervention. I discussed with the patient the nature of angiographic procedures, especially the limited patencies of any endovascular intervention.  The patient is aware of that the risks of an angiographic procedure include but are not limited to: bleeding, infection, access site complications, renal failure, embolization, rupture of vessel, dissection, possible need for emergent surgical intervention, possible need for surgical procedures to treat the patient's pathology, and stroke and death.    The patient is aware of the risks and agrees to proceed.  Adele Barthel, MD [facility] [place] Office: [phone] Pager: [phone]  01/04/2016, 10:39 AM

## 2016-01-11 ENCOUNTER — Ambulatory Visit (HOSPITAL_COMMUNITY)
Admission: RE | Admit: 2016-01-11 | Discharge: 2016-01-11 | Disposition: A | Discharge status: Home / Self Care | Payer: Medicare Other | Source: Ambulatory Visit | Attending: Vascular Surgery | Admitting: Vascular Surgery

## 2016-01-11 ENCOUNTER — Encounter (HOSPITAL_COMMUNITY)
Admission: RE | Disposition: A | Discharge status: Home / Self Care | Payer: Self-pay | Source: Ambulatory Visit | Attending: Vascular Surgery

## 2016-01-11 ENCOUNTER — Encounter (HOSPITAL_COMMUNITY): Payer: Self-pay | Admitting: Vascular Surgery

## 2016-01-11 DIAGNOSIS — Z992 Dependence on renal dialysis: Secondary | ICD-10-CM | POA: Diagnosis not present

## 2016-01-11 DIAGNOSIS — N186 End stage renal disease: Secondary | ICD-10-CM | POA: Diagnosis not present

## 2016-01-11 DIAGNOSIS — F17210 Nicotine dependence, cigarettes, uncomplicated: Secondary | ICD-10-CM | POA: Diagnosis not present

## 2016-01-11 DIAGNOSIS — K219 Gastro-esophageal reflux disease without esophagitis: Secondary | ICD-10-CM | POA: Insufficient documentation

## 2016-01-11 DIAGNOSIS — T82898A Other specified complication of vascular prosthetic devices, implants and grafts, initial encounter: Secondary | ICD-10-CM | POA: Diagnosis not present

## 2016-01-11 DIAGNOSIS — Z8249 Family history of ischemic heart disease and other diseases of the circulatory system: Secondary | ICD-10-CM | POA: Insufficient documentation

## 2016-01-11 DIAGNOSIS — I120 Hypertensive chronic kidney disease with stage 5 chronic kidney disease or end stage renal disease: Secondary | ICD-10-CM | POA: Insufficient documentation

## 2016-01-11 DIAGNOSIS — M79602 Pain in left arm: Secondary | ICD-10-CM | POA: Insufficient documentation

## 2016-01-11 DIAGNOSIS — E1122 Type 2 diabetes mellitus with diabetic chronic kidney disease: Secondary | ICD-10-CM | POA: Insufficient documentation

## 2016-01-11 DIAGNOSIS — Z823 Family history of stroke: Secondary | ICD-10-CM | POA: Insufficient documentation

## 2016-01-11 HISTORY — PX: PERIPHERAL VASCULAR CATHETERIZATION: SHX172C

## 2016-01-11 LAB — GLUCOSE, CAPILLARY: Glucose-Capillary: 116 mg/dL — ABNORMAL HIGH (ref 65–99)

## 2016-01-11 LAB — POCT I-STAT, CHEM 8
BUN: 29 mg/dL — AB (ref 6–20)
CREATININE: 7.2 mg/dL — AB (ref 0.61–1.24)
Calcium, Ion: 1.17 mmol/L (ref 1.12–1.23)
Chloride: 96 mmol/L — ABNORMAL LOW (ref 101–111)
Glucose, Bld: 117 mg/dL — ABNORMAL HIGH (ref 65–99)
HEMATOCRIT: 41 % (ref 39.0–52.0)
Hemoglobin: 13.9 g/dL (ref 13.0–17.0)
Potassium: 4.3 mmol/L (ref 3.5–5.1)
Sodium: 139 mmol/L (ref 135–145)
TCO2: 31 mmol/L (ref 0–100)

## 2016-01-11 SURGERY — A/V SHUNTOGRAM/FISTULAGRAM
Anesthesia: LOCAL | Site: Arm Upper | Laterality: Left

## 2016-01-11 MED ORDER — SODIUM CHLORIDE 0.9 % IJ SOLN: 3.0000 mL | INTRAMUSCULAR | Status: DC | PRN

## 2016-01-11 MED ORDER — LIDOCAINE HCL (PF) 1 % IJ SOLN
INTRAMUSCULAR | Status: DC | PRN
  Administered 2016-01-11: 6 mL

## 2016-01-11 SURGICAL SUPPLY — 10 items
BAG SNAP BAND KOVER 36X36 (MISCELLANEOUS) ×2 IMPLANT
COVER DOME SNAP 22 D (MISCELLANEOUS) ×2 IMPLANT
COVER PRB 48X5XTLSCP FOLD TPE (BAG) ×1 IMPLANT
COVER PROBE 5X48 (BAG) ×1
KIT MICROINTRODUCER STIFF 5F (SHEATH) ×2 IMPLANT
PROTECTION STATION PRESSURIZED (MISCELLANEOUS) ×2
STATION PROTECTION PRESSURIZED (MISCELLANEOUS) ×1 IMPLANT
STOPCOCK MORSE 400PSI 3WAY (MISCELLANEOUS) ×2 IMPLANT
TRAY PV CATH (CUSTOM PROCEDURE TRAY) ×2 IMPLANT
TUBING CIL FLEX 10 FLL-RA (TUBING) ×2 IMPLANT

## 2016-01-11 NOTE — Interval H&P Note (Signed)
[facility] [place]  History and Physical Update  The patient was interviewed and re-examined.  The patient's previous History and Physical has been reviewed and is unchanged from my consult.  His prior procedure was canceled last week due lack of childcare.  There is no change in the plan of care: L arm fistulogram, possible intervention.  I discussed with the patient the nature of angiographic procedures, especially the limited patencies of any endovascular intervention.  The patient is aware of that the risks of an angiographic procedure include but are not limited to: bleeding, infection, access site complications, renal failure, embolization, rupture of vessel, dissection, possible need for emergent surgical intervention, possible need for surgical procedures to treat the patient's pathology, anaphylactic reaction to contrast, and stroke and death.  The patient is aware of the risks and agrees to proceed.   Adele Barthel, MD [facility] [place] Office: [phone] Pager: [phone]  01/11/2016, 7:14 AM

## 2016-01-11 NOTE — H&P (View-Only) (Signed)
    Brief History and Physical   History of Present Illness  Shaun Burns is a 45 y.o. male who presents with chief complaint: left arm pain on hemodialysis.  He notes alarms running during his HD runs.  The patient presents today for L arm fistulogram, possible intervention..    Past Medical History  Diagnosis Date  . Chronic kidney disease   . Hypertension   . Diabetes mellitus without complication   . GERD (gastroesophageal reflux disease)   . Headache     Past Surgical History  Procedure Laterality Date  . Knee surgery    . Av fistula placement Left 03/30/2015    Procedure: LEFT BRACHIOCEPHALIC ARTERIOVENOUS (AV) FISTULA CREATION;  Surgeon: Elam Dutch, MD;  Location: [facility] OR;  Service: Vascular;  Laterality: Left;    Social History   Social History  . Marital Status: Widowed    Spouse Name: N/A  . Number of Children: N/A  . Years of Education: N/A   Occupational History  . Not on file.   Social History Main Topics  . Smoking status: Current Every Day Smoker -- 0.50 packs/day    Types: Cigarettes  . Smokeless tobacco: Never Used  . Alcohol Use: No  . Drug Use: No  . Sexual Activity: Not on file   Other Topics Concern  . Not on file   Social History Narrative    Family History  Problem Relation Age of Onset  . Hypertension Father   . Stroke Father   . Diabetes Mother     No current facility-administered medications on file prior to encounter.   Current Outpatient Prescriptions on File Prior to Encounter  Medication Sig Dispense Refill  . furosemide (LASIX) 40 MG tablet Take 80 mg by mouth every morning.    . labetalol (NORMODYNE) 200 MG tablet Take 400 mg by mouth daily.       No Known Allergies  Review of Systems: As listed above, otherwise negative.  Physical Examination  There were no vitals filed for this visit.  General: A&O x 3, WDWN  Pulmonary: Sym exp, good air movt, CTAB, no rales, rhonchi, & wheezing  Cardiac: RRR, Nl S1,  S2, no Murmurs, rubs or gallops  Gastrointestinal: soft, NTND, -G/R, - HSM, - masses, - CVAT B  Musculoskeletal: M/S 5/5 throughout, Extremities without ischemic changes , palpable thrill in L upper arm, +bruit  Laboratory See iStat  Medical Decision Making  Shaun Burns is a 45 y.o. male who presents with: L BC AVF malfunction possible venous stenosis   The patient is scheduled for: L arm fistulogram, possible intervention. I discussed with the patient the nature of angiographic procedures, especially the limited patencies of any endovascular intervention.  The patient is aware of that the risks of an angiographic procedure include but are not limited to: bleeding, infection, access site complications, renal failure, embolization, rupture of vessel, dissection, possible need for emergent surgical intervention, possible need for surgical procedures to treat the patient's pathology, and stroke and death.    The patient is aware of the risks and agrees to proceed.  Adele Barthel, MD [facility] [place] Office: [phone] Pager: [phone]  01/04/2016, 10:39 AM

## 2016-01-11 NOTE — Discharge Instructions (Signed)
Fistulogram, Care After °Refer to this sheet in the next few weeks. These instructions provide you with information on caring for yourself after your procedure. Your health care provider may also give you more specific instructions. Your treatment has been planned according to current medical practices, but problems sometimes occur. Call your health care provider if you have any problems or questions after your procedure. °WHAT TO EXPECT AFTER THE PROCEDURE °After your procedure, it is typical to have the following: °· A small amount of discomfort in the area where the catheters were placed. °· A small amount of bruising around the fistula. °· Sleepiness and fatigue. °HOME CARE INSTRUCTIONS °· Rest at home for the day following your procedure. °· Do not drive or operate heavy machinery while taking pain medicine. °· Take medicines only as directed by your health care provider. °· Do not take baths, swim, or use a hot tub until your health care provider approves. You may shower 24 hours after the procedure or as directed by your health care provider. °· There are many different ways to close and cover an incision, including stitches, skin glue, and adhesive strips. Follow your health care provider's instructions on: °¨ Incision care. °¨ Bandage (dressing) changes and removal. °¨ Incision closure removal. °· Monitor your dialysis fistula carefully. °SEEK MEDICAL CARE IF: °· You have drainage, redness, swelling, or pain at your catheter site. °· You have a fever. °· You have chills. °SEEK IMMEDIATE MEDICAL CARE IF: °· You feel weak. °· You have trouble balancing. °· You have trouble moving your arms or legs. °· You have problems with your speech or vision. °· You can no longer feel a vibration or buzz when you put your fingers over your dialysis fistula. °· The limb that was used for the procedure: °¨ Swells. °¨ Is painful. °¨ Is cold. °¨ Is discolored, such as blue or pale white. °  °This information is not intended  to replace advice given to you by your health care provider. Make sure you discuss any questions you have with your health care provider. °  °Document Released: 05/02/2014 Document Reviewed: 05/02/2014 °Elsevier Interactive Patient Education ©2016 Elsevier Inc. ° °

## 2016-01-12 ENCOUNTER — Telehealth: Payer: Self-pay | Admitting: Vascular Surgery

## 2016-01-12 NOTE — Telephone Encounter (Signed)
LM for pt re appt, dpm °

## 2016-01-12 NOTE — Telephone Encounter (Signed)
-----   Message from Mena Goes, RN sent at 01/11/2016  9:24 AM EST ----- Regarding: schedule   ----- Message -----    From: Conrad Towner, MD    Sent: 01/11/2016   9:06 AM      To: [facility] [address]  Shaun Burns [id number] [birth year]  Follow-up: 4-6 weeks s/p L arm fistulogram

## 2016-02-02 ENCOUNTER — Encounter: Payer: Self-pay | Admitting: Vascular Surgery

## 2016-02-09 ENCOUNTER — Encounter: Payer: Medicare Other | Admitting: Vascular Surgery

## 2017-12-30 HISTORY — PX: CORONARY ARTERY BYPASS GRAFT: SHX141

## 2018-08-04 ENCOUNTER — Other Ambulatory Visit: Payer: Self-pay

## 2018-08-04 DIAGNOSIS — N186 End stage renal disease: Secondary | ICD-10-CM

## 2018-08-04 DIAGNOSIS — Z992 Dependence on renal dialysis: Principal | ICD-10-CM

## 2018-08-04 DIAGNOSIS — M7989 Other specified soft tissue disorders: Secondary | ICD-10-CM

## 2018-08-06 ENCOUNTER — Ambulatory Visit (INDEPENDENT_AMBULATORY_CARE_PROVIDER_SITE_OTHER): Payer: Medicare Other | Admitting: Family

## 2018-08-06 ENCOUNTER — Encounter: Payer: Self-pay | Admitting: *Deleted

## 2018-08-06 ENCOUNTER — Other Ambulatory Visit: Payer: Self-pay

## 2018-08-06 ENCOUNTER — Encounter: Payer: Self-pay | Admitting: Family

## 2018-08-06 ENCOUNTER — Other Ambulatory Visit: Payer: Self-pay | Admitting: *Deleted

## 2018-08-06 ENCOUNTER — Ambulatory Visit (HOSPITAL_COMMUNITY)
Admission: RE | Admit: 2018-08-06 | Discharge: 2018-08-06 | Disposition: A | Discharge status: Home / Self Care | Payer: Medicare Other | Source: Ambulatory Visit | Attending: Family | Admitting: Family

## 2018-08-06 VITALS — BP 190/94 | HR 69 | Resp 20 | Ht 69.0 in | Wt 201.0 lb

## 2018-08-06 DIAGNOSIS — Z992 Dependence on renal dialysis: Secondary | ICD-10-CM | POA: Diagnosis not present

## 2018-08-06 DIAGNOSIS — N186 End stage renal disease: Secondary | ICD-10-CM | POA: Diagnosis present

## 2018-08-06 DIAGNOSIS — M7989 Other specified soft tissue disorders: Secondary | ICD-10-CM

## 2018-08-06 NOTE — Progress Notes (Signed)
CC: Left arm swelling worsened since CABG in July 2019, has left arm AVF, ESRD on hemodialysis  History of Present Illness  Shaun Burns is a 47 y.o. (21-Jul-1971) male who is s/p left brachiocephalic AV fistula placed on 03/30/2015 by Dr. Oneida Alar.  This is his first permanent HD access.   He returns today at the request of Dr. Lowanda Foster with asymptomatic swelling in his left arm.   Pt states ever since he had  CABG in July 2019 his left arm has been swollen, and decreases and increases in the degree of swelling. Pt denies discomfort, denies fever or chills, he states the access works well for dialysis.   He states mild numbness in the fingers of his left hand.   Dr. Valinda Hoar last evaluated pt on 06-29-15. At that time pt had a well-healed incision, palpable thrill in fistula fistula was visible beneath the skin surface. Duplex ultrasound AV fistula that day showed it was 7 mm in diameter and less than 3 mm from the skin surface  aturing left arm AV fistula Dr. Oneida Alar advised start cannulation at that point. Patient was to follow-up on as-needed basis. Patient was encouraged to follow up with his primary care physician regarding hypertension.  Pt states he is working with his nephrologist to adjust his hypertension medications.   He denies headache, denies chest pain, states occasional mild dyspnea.   He dialyzes on M-W-F at [facility] in [place], via his left upper arm AVF.   Dr. Bridgett Larsson performed a shuntogram on 01-11-16.    Past Medical History:  Diagnosis Date  . Chronic kidney disease   . Diabetes mellitus without complication ([place])   . GERD (gastroesophageal reflux disease)   . Headache   . Hypertension     Social History Social History   Tobacco Use  . Smoking status: Former Smoker    Packs/day: 0.50    Types: Cigarettes    Last attempt to quit: 06/06/2018    Years since quitting: 0.1  . Smokeless tobacco: Never Used  Substance Use Topics  . Alcohol use: No    Alcohol/week:  0.0 standard drinks  . Drug use: No    Family History Family History  Problem Relation Age of Onset  . Hypertension Father   . Stroke Father   . Diabetes Mother     Surgical History Past Surgical History:  Procedure Laterality Date  . AV FISTULA PLACEMENT Left 03/30/2015   Procedure: LEFT BRACHIOCEPHALIC ARTERIOVENOUS (AV) FISTULA CREATION;  Surgeon: Elam Dutch, MD;  Location: [facility];  Service: Vascular;  Laterality: Left;  . KNEE SURGERY    . PERIPHERAL VASCULAR CATHETERIZATION Left 01/11/2016   Procedure: A/V Shuntogram/Fistulagram;  Surgeon: Conrad Bergen, MD;  Location: [facility];  Service: Cardiovascular;  Laterality: Left;    No Known Allergies  Current Outpatient Medications  Medication Sig Dispense Refill  . furosemide (LASIX) 40 MG tablet Take 80 mg by mouth every morning.    . labetalol (NORMODYNE) 200 MG tablet Take 400 mg by mouth daily.     . sevelamer carbonate (RENVELA) 800 MG tablet Take 800-1,600 mg by mouth See admin instructions. 2 tablets 3 times daily with meals, 1 tablet twice daily with snacks     No current facility-administered medications for this visit.      REVIEW OF SYSTEMS: see HPI for pertinent positives and negatives    PHYSICAL EXAMINATION:  Vitals:   08/06/18 0912 08/06/18 0917  BP: (!) 192/94 (!) 190/94  Pulse: 69   Resp: 20   SpO2: 95%   Weight: 201 lb (91.2 kg)   Height: 5\' 9"  (1.753 m)    Body mass index is 29.68 kg/m.  General: The patient appears his stated age.   HEENT:  No gross abnormalities Pulmonary: Respirations are non-labored, limited air movement in posterior fields Abdomen: Soft and non-tender with normal bowel sounds. Musculoskeletal: There are no major deformities.   Neurologic: No focal weakness, decreased sensation to touch in left hand compared to right. Skin: There are no ulcer or rashes noted. Psychiatric: The patient has normal affect. Cardiovascular: There is a regular rate and rhythm  without significant murmur appreciated.  Bilateral radial pulses are 1+ palpable. Left upper arm AV fistula with palpable thrill.  Left upper chest venous hypertension with veins distended. Left arm with non pitting soft edema, see photo below.      Non-Invasive Vascular Imaging  Left Upper Arm Venous Duplex (08-06-18): No evidence of acute left upper extremity deep vein thrombosis. The basilic vein appears compressible and patent.  The cephalic vein appears patent throughout the forearm. 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  Unable to compress the subclavian vein due to bony structures, appears patent.   Medical Decision Making  Shaun Burns is a 47 y.o. male who is s/p left brachiocephalic AV fistula placed on 03/30/2015.  He reports some swelling in his left arm prior to his CABG in July 2019, but increased swelling in his left arm since then.  He has distention of several veins at the left side of his chest.   Dr. Oneida Alar and Dr. Carlis Abbott spoke with and examined pt Will schedule for fistula gram on a non HD day, to evaluate for venous stenosis, will schedule for 08-13-18 with Dr. Carlis Abbott. Vinnie Level Sedric Guia, RN, MSN, FNP-C [facility] Office: [phone]  08/06/2018, 9:18 AM  Clinic MD: Oneida Alar

## 2018-08-13 ENCOUNTER — Ambulatory Visit (HOSPITAL_COMMUNITY)
Admission: RE | Admit: 2018-08-13 | Discharge: 2018-08-13 | Disposition: A | Discharge status: Home / Self Care | Payer: Medicare Other | Source: Ambulatory Visit | Attending: Vascular Surgery | Admitting: Vascular Surgery

## 2018-08-13 ENCOUNTER — Encounter (HOSPITAL_COMMUNITY)
Admission: RE | Disposition: A | Discharge status: Home / Self Care | Payer: Self-pay | Source: Ambulatory Visit | Attending: Vascular Surgery

## 2018-08-13 ENCOUNTER — Encounter (HOSPITAL_COMMUNITY): Payer: Self-pay | Admitting: Vascular Surgery

## 2018-08-13 DIAGNOSIS — I871 Compression of vein: Secondary | ICD-10-CM | POA: Diagnosis not present

## 2018-08-13 DIAGNOSIS — Y832 Surgical operation with anastomosis, bypass or graft as the cause of abnormal reaction of the patient, or of later complication, without mention of misadventure at the time of the procedure: Secondary | ICD-10-CM | POA: Diagnosis not present

## 2018-08-13 DIAGNOSIS — T82898A Other specified complication of vascular prosthetic devices, implants and grafts, initial encounter: Secondary | ICD-10-CM

## 2018-08-13 HISTORY — PX: PERIPHERAL VASCULAR BALLOON ANGIOPLASTY: CATH118281

## 2018-08-13 HISTORY — PX: A/V FISTULAGRAM: CATH118298

## 2018-08-13 LAB — POCT I-STAT, CHEM 8
BUN: 24 mg/dL — AB (ref 6–20)
CALCIUM ION: 1.12 mmol/L — AB (ref 1.15–1.40)
CHLORIDE: 97 mmol/L — AB (ref 98–111)
Creatinine, Ser: 8.5 mg/dL — ABNORMAL HIGH (ref 0.61–1.24)
Glucose, Bld: 93 mg/dL (ref 70–99)
HCT: 33 % — ABNORMAL LOW (ref 39.0–52.0)
Hemoglobin: 11.2 g/dL — ABNORMAL LOW (ref 13.0–17.0)
Potassium: 3.8 mmol/L (ref 3.5–5.1)
SODIUM: 138 mmol/L (ref 135–145)
TCO2: 27 mmol/L (ref 22–32)

## 2018-08-13 LAB — GLUCOSE, CAPILLARY: Glucose-Capillary: 88 mg/dL (ref 70–99)

## 2018-08-13 SURGERY — A/V FISTULAGRAM
Anesthesia: LOCAL

## 2018-08-13 SURGERY — Surgical Case
Anesthesia: *Unknown

## 2018-08-13 MED ORDER — LIDOCAINE HCL (PF) 1 % IJ SOLN
INTRAMUSCULAR | Status: AC
  Filled 2018-08-13: qty 30

## 2018-08-13 MED ORDER — IODIXANOL 320 MG/ML IV SOLN
INTRAVENOUS | Status: DC | PRN
  Administered 2018-08-13: 70 mL via INTRAVENOUS

## 2018-08-13 MED ORDER — SODIUM CHLORIDE 0.9 % IV SOLN: 250.0000 mL | INTRAVENOUS | Status: DC | PRN

## 2018-08-13 MED ORDER — HEPARIN (PORCINE) IN NACL 1000-0.9 UT/500ML-% IV SOLN
INTRAVENOUS | Status: AC
  Filled 2018-08-13: qty 500

## 2018-08-13 MED ORDER — LIDOCAINE HCL (PF) 1 % IJ SOLN
INTRAMUSCULAR | Status: DC | PRN
  Administered 2018-08-13: 15 mL

## 2018-08-13 MED ORDER — HEPARIN (PORCINE) IN NACL 1000-0.9 UT/500ML-% IV SOLN
INTRAVENOUS | Status: DC | PRN
  Administered 2018-08-13: 500 mL

## 2018-08-13 MED ORDER — HEPARIN SODIUM (PORCINE) 1000 UNIT/ML IJ SOLN
INTRAMUSCULAR | Status: DC | PRN
  Administered 2018-08-13: 3000 [IU] via INTRAVENOUS

## 2018-08-13 MED ORDER — SODIUM CHLORIDE 0.9% FLUSH: 3.0000 mL | INTRAVENOUS | Status: DC | PRN

## 2018-08-13 MED ORDER — SODIUM CHLORIDE 0.9% FLUSH: 3.0000 mL | Freq: Two times a day (BID) | INTRAVENOUS | Status: DC

## 2018-08-13 MED ORDER — HEPARIN SODIUM (PORCINE) 1000 UNIT/ML IJ SOLN
INTRAMUSCULAR | Status: AC
  Filled 2018-08-13: qty 1

## 2018-08-13 SURGICAL SUPPLY — 18 items
BAG SNAP BAND KOVER 36X36 (MISCELLANEOUS) ×3 IMPLANT
BALLN ATLAS 14X40X75 (BALLOONS) ×3
BALLN MUSTANG 12.0X40 75 (BALLOONS) ×3
BALLOON ATLAS 14X40X75 (BALLOONS) ×2 IMPLANT
BALLOON MUSTANG 12.0X40 75 (BALLOONS) ×2 IMPLANT
COVER DOME SNAP 22 D (MISCELLANEOUS) ×3 IMPLANT
COVER PRB 48X5XTLSCP FOLD TPE (BAG) ×2 IMPLANT
COVER PROBE 5X48 (BAG) ×1
KIT ENCORE 26 ADVANTAGE (KITS) ×3 IMPLANT
KIT MICROPUNCTURE NIT STIFF (SHEATH) ×3 IMPLANT
PROTECTION STATION PRESSURIZED (MISCELLANEOUS) ×3
SHEATH PINNACLE R/O II 7F 4CM (SHEATH) ×3 IMPLANT
SHEATH PROBE COVER 6X72 (BAG) ×3 IMPLANT
STATION PROTECTION PRESSURIZED (MISCELLANEOUS) ×2 IMPLANT
STOPCOCK MORSE 400PSI 3WAY (MISCELLANEOUS) ×3 IMPLANT
TRAY PV CATH (CUSTOM PROCEDURE TRAY) ×3 IMPLANT
TUBING CIL FLEX 10 FLL-RA (TUBING) ×3 IMPLANT
WIRE BENTSON .035X145CM (WIRE) ×3 IMPLANT

## 2018-08-13 NOTE — Discharge Instructions (Signed)

## 2018-08-13 NOTE — H&P (Signed)
History and Physical Interval Note:  08/13/2018 8:54 AM  Shaun Burns  has presented today for surgery, with the diagnosis of Swelling L arm  The various methods of treatment have been discussed with the patient and family. After consideration of risks, benefits and other options for treatment, the patient has consented to  Procedure(s): A/V FISTULAGRAM (N/A) as a surgical intervention .  The patient's history has been reviewed, patient examined, no change in status, stable for surgery.  I have reviewed the patient's chart and labs.  Questions were answered to the patient's satisfaction.    Plan for LUE fistulogram.  Swelling in left arm after CABG.  Good thrill in brachiocephalic fistula.   Marty Heck

## 2018-08-13 NOTE — Op Note (Signed)
    OPERATIVE NOTE   PROCEDURE: 1. Left brachiocephalic arteriovenous cannulation under ultrasound guidance 2. Left arm fistulogram 3.   Left central venous angioplasty (subclavian vein angioplasty with 12 mm x 40 mm Mustang and 14 mm x 40 mm Atlas)  PRE-OPERATIVE DIAGNOSIS: Malfunctioning left arteriovenous fistula with arm swelling  POST-OPERATIVE DIAGNOSIS: same as above   SURGEON: Marty Heck, MD  ANESTHESIA: local  ESTIMATED BLOOD LOSS: 5 cc  FINDING(S): 1. Left upper arm brachiocephalic fistula with patent drainage in the arm itself.  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  This was angioplastied with a 12 mm and 14 mm angioplasty balloon with less than 30% remnant stenosis.  SPECIMEN(S):  None  CONTRAST: 70 cc  INDICATIONS: Shaun Burns is a 47 y.o. male who  presents with malfunctioning left brachiocephalic arteriovenous fistula.  The patient is scheduled for left fistulogram with evaluation of central veins..  The patient is aware the risks include but are not limited to: bleeding, infection, thrombosis of the cannulated access, and possible anaphylactic reaction to the contrast.  The patient is aware of the risks of the procedure and elects to proceed forward.  DESCRIPTION: After full informed written consent was obtained, the patient was brought back to the angiography suite and placed supine upon the angiography table.  The patient was connected to monitoring equipment.  The left arm was prepped and draped in the standard fashion for a left fistulogram.  Under ultrasound guidance, the left cephalic vein of his brachiocephalic arteriovenous fistula was cannulated with a micropuncture needle.  The microwire was advanced into the fistula and the needle was exchanged for the a microsheath, which was lodged 2 cm into the access.  The wire was removed and the sheath was  connected to the IV extension tubing.  Hand injections were completed to image the access from the antecubitum up to the level of axilla.  The central venous structures were also imaged by hand injections.  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  At this point in time we placed a Bentson wire into the superior vena cava and exchanged for short 7 French sheath in the left arm.  The patient was given 3000 units of IV heparin.  We initially used a 12 mm x 40 mm Mustang to angioplasty the stenosis that was greater than 90%.  There was still some remnant waist in the balloon after the balloon was inflated to nominal pressure and held for 2 minutes.  At this point time we exchanged for a 14 mm x 40 mm Atlas and again placed it in the stenotic segment and had significant improvement with less than 30% remnant stenosis.  A final central venogram showed significant improvement with quick emptying into the superior vena cava.  A 4-0 Monocryl purse-string suture was sewn around the sheath.  The sheath was removed while tying down the suture.  A sterile bandage was applied to the puncture site.  COMPLICATIONS: None  CONDITION: Stable  Marty Heck, MD [facility] Office: [phone] Pager: [phone]  08/13/2018 12:00 PM

## 2020-01-17 ENCOUNTER — Other Ambulatory Visit: Payer: Self-pay

## 2020-01-17 ENCOUNTER — Telehealth (HOSPITAL_COMMUNITY): Payer: Self-pay

## 2020-01-17 DIAGNOSIS — M7989 Other specified soft tissue disorders: Secondary | ICD-10-CM

## 2020-01-17 NOTE — Telephone Encounter (Signed)

## 2020-01-17 NOTE — H&P (View-Only) (Signed)
HISTORY AND PHYSICAL     CC:  dialysis access evaluation Requesting Provider:  Briant Sites*  HPI: This is a 49 y.o. male here for evaluation oh his hemodialysis access.  He has a history of left brachiocephalic AV fistula that was done on March 30, 2015 by Dr. Oneida Alar.  In August 2019 he underwent a fistulogram of the left brachiocephalic fistula by Dr. Carlis Abbott on August 13, 2018.  At that time he underwent left central venous angioplasty.  He presents today for evaluation of his fistula.  He states they are getting high pressures and he was referred for evaluation.  He does not have any pain in his hand.  He dialyzes M/W/F at [facility] in [place].    The pt is right hand dominant.      He states if he can quit smoking, he could be evaluated for transplant.  He states that everyone in his house smokes.  He quit after his open heart surgery but started back.   The pt [place] on a statin for cholesterol management.  The pt is not diabetic.   The pt is on CCB and BB for hypertension.   Tobacco hx:  remote The pt is on a daily aspirin. Other AC:  none  Past Medical History:  Diagnosis Date  . Chronic kidney disease   . Diabetes mellitus without complication ([place])   . GERD (gastroesophageal reflux disease)   . Headache   . Hypertension     Past Surgical History:  Procedure Laterality Date  . A/V FISTULAGRAM N/A 08/13/2018   Procedure: A/V FISTULAGRAM;  Surgeon: Marty Heck, MD;  Location: [facility];  Service: Cardiovascular;  Laterality: N/A;  . AV FISTULA PLACEMENT Left 03/30/2015   Procedure: LEFT BRACHIOCEPHALIC ARTERIOVENOUS (AV) FISTULA CREATION;  Surgeon: Elam Dutch, MD;  Location: [facility];  Service: Vascular;  Laterality: Left;  . KNEE SURGERY    . PERIPHERAL VASCULAR BALLOON ANGIOPLASTY Left 08/13/2018   Procedure: PERIPHERAL VASCULAR BALLOON ANGIOPLASTY;  Surgeon: Marty Heck, MD;  Location: [facility];  Service: Cardiovascular;   Laterality: Left;  central venous   . PERIPHERAL VASCULAR CATHETERIZATION Left 01/11/2016   Procedure: A/V Shuntogram/Fistulagram;  Surgeon: Conrad Tripoli, MD;  Location: [facility];  Service: Cardiovascular;  Laterality: Left;    No Known Allergies  Current Outpatient Medications  Medication Sig Dispense Refill  . acetaminophen (TYLENOL) 500 MG tablet Take 500 mg by mouth daily as needed for pain.    Marland Kitchen amLODipine (NORVASC) 5 MG tablet Take 5 mg by mouth daily.    Marland Kitchen aspirin EC 81 MG tablet Take 81 mg by mouth daily.    Marland Kitchen atorvastatin (LIPITOR) 40 MG tablet Take 40 mg by mouth daily.  2  . furosemide (LASIX) 80 MG tablet Take 80 mg by mouth daily.     Marland Kitchen labetalol (NORMODYNE) 200 MG tablet Take 400 mg by mouth daily.     . multivitamin (RENA-VIT) TABS tablet Take 1 tablet by mouth daily.  6  . sevelamer carbonate (RENVELA) 800 MG tablet Take 800-1,600 mg by mouth See admin instructions. 2 tablets 3 times daily with meals, 1 tablet twice daily with snacks     No current facility-administered medications for this visit.    Family History  Problem Relation Age of Onset  . Hypertension Father   . Stroke Father   . Diabetes Mother     Social History   Socioeconomic History  . Marital  status: Widowed    Spouse name: Not on file  . Number of children: Not on file  . Years of education: Not on file  . Highest education level: Not on file  Occupational History  . Not on file  Tobacco Use  . Smoking status: Former Smoker    Packs/day: 0.50    Types: Cigarettes    Quit date: 06/06/2018    Years since quitting: 1.6  . Smokeless tobacco: Never Used  Substance and Sexual Activity  . Alcohol use: No    Alcohol/week: 0.0 standard drinks  . Drug use: No  . Sexual activity: Not on file  Other Topics Concern  . Not on file  Social History Narrative  . Not on file   Social Determinants of Health   Financial Resource Strain:   . Difficulty of Paying Living Expenses: Not on file   Food Insecurity:   . Worried About Charity fundraiser in the Last Year: Not on file  . Ran Out of Food in the Last Year: Not on file  Transportation Needs:   . Lack of Transportation (Medical): Not on file  . Lack of Transportation (Non-Medical): Not on file  Physical Activity:   . Days of Exercise per Week: Not on file  . Minutes of Exercise per Session: Not on file  Stress:   . Feeling of Stress : Not on file  Social Connections:   . Frequency of Communication with Friends and Family: Not on file  . Frequency of Social Gatherings with Friends and Family: Not on file  . Attends Religious Services: Not on file  . Active Member of Clubs or Organizations: Not on file  . Attends Archivist Meetings: Not on file  . Marital Status: Not on file  Intimate Partner Violence:   . Fear of Current or Ex-Partner: Not on file  . Emotionally Abused: Not on file  . Physically Abused: Not on file  . Sexually Abused: Not on file     ROS: [x]  Positive   [ ]  Negative   [ ]  All sytems reviewed and are negative  Cardiac: [x]  high blood pressure  Vascular: []  pain in legs while walking []  pain in feet when lying flat  Pulmonary: []  asthma []  wheezing  Neurologic: []  hx CVA  Hematologic: []  bleeding problems  GI []  GERD  GU: [x]  CKD/renal failure  [x]  HD---[x]  M/W/F []  T/T/S  Psychiatric: []  hx of major depression  Integumentary: []  rashes []  ulcers  Constitutional: []  fever []  chills  PHYSICAL EXAMINATION:  Today's Vitals   01/18/20 1437  BP: 128/85  Pulse: 83  Resp: 18  Temp: (!) 97.4 F (36.3 C)  TempSrc: Oral  SpO2: 98%  Weight: 207 lb 14.4 oz (94.3 kg)  Height: 5\' 10"  (1.778 m)   Body mass index is 29.83 kg/m.    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  +thrill. Musculoskeletal: no muscle wasting or atrophy  Neurologic: A&O X 3; Moving all extremities equally;  Speech is fluent/normal  Non-Invasive Vascular Imaging:   Dialysis access duplex on 01/18/2020:  Findings: +--------------------+----------+-----------------+--------+ AVF  PSV (cm/s)Flow Vol (mL/min)Comments +--------------------+----------+-----------------+--------+ Native artery inflow   273          1845                +--------------------+----------+-----------------+--------+ AVF Anastomosis        331                              +--------------------+----------+-----------------+--------+    +------------+----------+-------------+----------+--------+ OUTFLOW VEINPSV (cm/s)Diameter (cm)Depth (cm)Describe +------------+----------+-------------+----------+--------+ Prox UA        467        1.01        0.63   stenotic +------------+----------+-------------+----------+--------+ Mid UA          56        1.06        0.45            +------------+----------+-------------+----------+--------+ Dist UA         32        2.45        0.15            +------------+----------+-------------+----------+--------+ AC Fossa       250        0.83        0.33            +------------+----------+-------------+----------+--------+ Peak systolic velocities in the proximal upper arm segment of the cephalic vein increase from 77.8 cm/sec to 467 cm/sec consistent with 50-99% stenosis.   ASSESSMENT/PLAN: 49 y.o. male here for evaluation oh his hemodialysis access.  He has a history of left brachiocephalic AV fistula that was done on March 30, 2015 by Dr. Oneida Alar.  In August 2019 he underwent a fistulogram of the left brachiocephalic fistula by Dr.  Carlis Abbott on August 13, 2018.  At that time he underwent left central venous angioplasty.  He presents today for evaluation for high venous pressures.  -pt's duplex today reveals a stenosis in the proximal upper arm.  Will schedule for fistulogram with possible intervention. He dialyzes M/W/F in [place] at the [facility].  -pt is not on anticoagulation   Leontine Locket, PA-C [facility] [phone]  Clinic MD:   Early

## 2020-01-17 NOTE — Progress Notes (Signed)
HISTORY AND PHYSICAL     CC:  dialysis access evaluation Requesting Provider:  Briant Sites*  HPI: This is a 49 y.o. male here for evaluation oh his hemodialysis access.  He has a history of left brachiocephalic AV fistula that was done on March 30, 2015 by Dr. Oneida Alar.  In August 2019 he underwent a fistulogram of the left brachiocephalic fistula by Dr. Carlis Abbott on August 13, 2018.  At that time he underwent left central venous angioplasty.  He presents today for evaluation of his fistula.  He states they are getting high pressures and he was referred for evaluation.  He does not have any pain in his hand.  He dialyzes M/W/F at [facility] in [place].    The pt is right hand dominant.      He states if he can quit smoking, he could be evaluated for transplant.  He states that everyone in his house smokes.  He quit after his open heart surgery but started back.   The pt [place] on a statin for cholesterol management.  The pt is not diabetic.   The pt is on CCB and BB for hypertension.   Tobacco hx:  remote The pt is on a daily aspirin. Other AC:  none  Past Medical History:  Diagnosis Date  . Chronic kidney disease   . Diabetes mellitus without complication ([place])   . GERD (gastroesophageal reflux disease)   . Headache   . Hypertension     Past Surgical History:  Procedure Laterality Date  . A/V FISTULAGRAM N/A 08/13/2018   Procedure: A/V FISTULAGRAM;  Surgeon: Marty Heck, MD;  Location: [place] [facility];  Service: Cardiovascular;  Laterality: N/A;  . AV FISTULA PLACEMENT Left 03/30/2015   Procedure: LEFT BRACHIOCEPHALIC ARTERIOVENOUS (AV) FISTULA CREATION;  Surgeon: Elam Dutch, MD;  Location: [place];  Service: Vascular;  Laterality: Left;  . KNEE SURGERY    . PERIPHERAL VASCULAR BALLOON ANGIOPLASTY Left 08/13/2018   Procedure: PERIPHERAL VASCULAR BALLOON ANGIOPLASTY;  Surgeon: Marty Heck, MD;  Location: [place] [facility];  Service: Cardiovascular;   Laterality: Left;  central venous   . PERIPHERAL VASCULAR CATHETERIZATION Left 01/11/2016   Procedure: A/V Shuntogram/Fistulagram;  Surgeon: Conrad Wolfhurst, MD;  Location: [place] [facility];  Service: Cardiovascular;  Laterality: Left;    No Known Allergies  Current Outpatient Medications  Medication Sig Dispense Refill  . acetaminophen (TYLENOL) 500 MG tablet Take 500 mg by mouth daily as needed for pain.    Marland Kitchen amLODipine (NORVASC) 5 MG tablet Take 5 mg by mouth daily.    Marland Kitchen aspirin EC 81 MG tablet Take 81 mg by mouth daily.    Marland Kitchen atorvastatin (LIPITOR) 40 MG tablet Take 40 mg by mouth daily.  2  . furosemide (LASIX) 80 MG tablet Take 80 mg by mouth daily.     Marland Kitchen labetalol (NORMODYNE) 200 MG tablet Take 400 mg by mouth daily.     . multivitamin (RENA-VIT) TABS tablet Take 1 tablet by mouth daily.  6  . sevelamer carbonate (RENVELA) 800 MG tablet Take 800-1,600 mg by mouth See admin instructions. 2 tablets 3 times daily with meals, 1 tablet twice daily with snacks     No current facility-administered medications for this visit.    Family History  Problem Relation Age of Onset  . Hypertension Father   . Stroke Father   . Diabetes Mother     Social History   Socioeconomic History  . Marital  status: Widowed    Spouse name: Not on file  . Number of children: Not on file  . Years of education: Not on file  . Highest education level: Not on file  Occupational History  . Not on file  Tobacco Use  . Smoking status: Former Smoker    Packs/day: 0.50    Types: Cigarettes    Quit date: 06/06/2018    Years since quitting: 1.6  . Smokeless tobacco: Never Used  Substance and Sexual Activity  . Alcohol use: No    Alcohol/week: 0.0 standard drinks  . Drug use: No  . Sexual activity: Not on file  Other Topics Concern  . Not on file  Social History Narrative  . Not on file   Social Determinants of Health   Financial Resource Strain:   . Difficulty of Paying Living Expenses: Not on file   Food Insecurity:   . Worried About Charity fundraiser in the Last Year: Not on file  . Ran Out of Food in the Last Year: Not on file  Transportation Needs:   . Lack of Transportation (Medical): Not on file  . Lack of Transportation (Non-Medical): Not on file  Physical Activity:   . Days of Exercise per Week: Not on file  . Minutes of Exercise per Session: Not on file  Stress:   . Feeling of Stress : Not on file  Social Connections:   . Frequency of Communication with Friends and Family: Not on file  . Frequency of Social Gatherings with Friends and Family: Not on file  . Attends Religious Services: Not on file  . Active Member of Clubs or Organizations: Not on file  . Attends Archivist Meetings: Not on file  . Marital Status: Not on file  Intimate Partner Violence:   . Fear of Current or Ex-Partner: Not on file  . Emotionally Abused: Not on file  . Physically Abused: Not on file  . Sexually Abused: Not on file     ROS: [x]  Positive   [ ]  Negative   [ ]  All sytems reviewed and are negative  Cardiac: [x]  high blood pressure  Vascular: []  pain in legs while walking []  pain in feet when lying flat  Pulmonary: []  asthma []  wheezing  Neurologic: []  hx CVA  Hematologic: []  bleeding problems  GI []  GERD  GU: [x]  CKD/renal failure  [x]  HD---[x]  M/W/F []  T/T/S  Psychiatric: []  hx of major depression  Integumentary: []  rashes []  ulcers  Constitutional: []  fever []  chills  PHYSICAL EXAMINATION:  Today's Vitals   01/18/20 1437  BP: 128/85  Pulse: 83  Resp: 18  Temp: (!) 97.4 F (36.3 C)  TempSrc: Oral  SpO2: 98%  Weight: 207 lb 14.4 oz (94.3 kg)  Height: 5\' 10"  (1.778 m)   Body mass index is 29.83 kg/m.    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  +thrill. Musculoskeletal: no muscle wasting or atrophy  Neurologic: A&O X 3; Moving all extremities equally;  Speech is fluent/normal  Non-Invasive Vascular Imaging:   Dialysis access duplex on 01/18/2020:  Findings: +--------------------+----------+-----------------+--------+ AVF  PSV (cm/s)Flow Vol (mL/min)Comments +--------------------+----------+-----------------+--------+ Native artery inflow   273          1845                +--------------------+----------+-----------------+--------+ AVF Anastomosis        331                              +--------------------+----------+-----------------+--------+    +------------+----------+-------------+----------+--------+ OUTFLOW VEINPSV (cm/s)Diameter (cm)Depth (cm)Describe +------------+----------+-------------+----------+--------+ Prox UA        467        1.01        0.63   stenotic +------------+----------+-------------+----------+--------+ Mid UA          56        1.06        0.45            +------------+----------+-------------+----------+--------+ Dist UA         32        2.45        0.15            +------------+----------+-------------+----------+--------+ AC Fossa       250        0.83        0.33            +------------+----------+-------------+----------+--------+ Peak systolic velocities in the proximal upper arm segment of the cephalic vein increase from 77.8 cm/sec to 467 cm/sec consistent with 50-99% stenosis.   ASSESSMENT/PLAN: 49 y.o. male here for evaluation oh his hemodialysis access.  He has a history of left brachiocephalic AV fistula that was done on March 30, 2015 by Dr. Oneida Alar.  In August 2019 he underwent a fistulogram of the left brachiocephalic fistula by Dr.  Carlis Abbott on August 13, 2018.  At that time he underwent left central venous angioplasty.  He presents today for evaluation for high venous pressures.  -pt's duplex today reveals a stenosis in the proximal upper arm.  Will schedule for fistulogram with possible intervention. He dialyzes M/W/F in [place] at the [facility].  -pt is not on anticoagulation   Leontine Locket, PA-C [facility] [phone]  Clinic MD:   Early

## 2020-01-18 ENCOUNTER — Encounter: Payer: Self-pay | Admitting: *Deleted

## 2020-01-18 ENCOUNTER — Other Ambulatory Visit: Payer: Self-pay

## 2020-01-18 ENCOUNTER — Ambulatory Visit (INDEPENDENT_AMBULATORY_CARE_PROVIDER_SITE_OTHER): Payer: Medicare Other | Admitting: Physician Assistant

## 2020-01-18 ENCOUNTER — Ambulatory Visit (HOSPITAL_COMMUNITY)
Admission: RE | Admit: 2020-01-18 | Discharge: 2020-01-18 | Disposition: A | Discharge status: Home / Self Care | Payer: Medicare Other | Source: Ambulatory Visit | Attending: Surgery | Admitting: Surgery

## 2020-01-18 VITALS — BP 128/85 | HR 83 | Temp 97.4°F | Resp 18 | Ht 70.0 in | Wt 207.9 lb

## 2020-01-18 DIAGNOSIS — M7989 Other specified soft tissue disorders: Secondary | ICD-10-CM | POA: Diagnosis not present

## 2020-01-18 DIAGNOSIS — Z992 Dependence on renal dialysis: Secondary | ICD-10-CM | POA: Diagnosis not present

## 2020-01-18 DIAGNOSIS — N186 End stage renal disease: Secondary | ICD-10-CM

## 2020-01-21 ENCOUNTER — Other Ambulatory Visit (HOSPITAL_COMMUNITY)
Admission: RE | Admit: 2020-01-21 | Discharge: 2020-01-21 | Disposition: A | Discharge status: Home / Self Care | Payer: Medicare Other | Source: Ambulatory Visit | Attending: Surgery | Admitting: Surgery

## 2020-01-21 ENCOUNTER — Other Ambulatory Visit: Payer: Self-pay

## 2020-01-21 DIAGNOSIS — Z01812 Encounter for preprocedural laboratory examination: Secondary | ICD-10-CM | POA: Insufficient documentation

## 2020-01-21 DIAGNOSIS — Z20822 Contact with and (suspected) exposure to covid-19: Secondary | ICD-10-CM | POA: Diagnosis not present

## 2020-01-22 LAB — SARS CORONAVIRUS 2 (TAT 6-24 HRS): SARS Coronavirus 2: NEGATIVE

## 2020-01-25 ENCOUNTER — Ambulatory Visit (HOSPITAL_COMMUNITY)
Admission: RE | Admit: 2020-01-25 | Discharge: 2020-01-25 | Disposition: A | Discharge status: Home / Self Care | Payer: Medicare Other | Attending: Surgery | Admitting: Surgery

## 2020-01-25 ENCOUNTER — Other Ambulatory Visit: Payer: Self-pay

## 2020-01-25 ENCOUNTER — Encounter (HOSPITAL_COMMUNITY)
Admission: RE | Disposition: A | Discharge status: Home / Self Care | Payer: Self-pay | Source: Home / Self Care | Attending: Surgery

## 2020-01-25 ENCOUNTER — Encounter (HOSPITAL_COMMUNITY): Payer: Self-pay | Admitting: Surgery

## 2020-01-25 DIAGNOSIS — E1122 Type 2 diabetes mellitus with diabetic chronic kidney disease: Secondary | ICD-10-CM | POA: Diagnosis not present

## 2020-01-25 DIAGNOSIS — K219 Gastro-esophageal reflux disease without esophagitis: Secondary | ICD-10-CM | POA: Diagnosis not present

## 2020-01-25 DIAGNOSIS — Z79899 Other long term (current) drug therapy: Secondary | ICD-10-CM | POA: Diagnosis not present

## 2020-01-25 DIAGNOSIS — Z7982 Long term (current) use of aspirin: Secondary | ICD-10-CM | POA: Diagnosis not present

## 2020-01-25 DIAGNOSIS — Y841 Kidney dialysis as the cause of abnormal reaction of the patient, or of later complication, without mention of misadventure at the time of the procedure: Secondary | ICD-10-CM | POA: Diagnosis not present

## 2020-01-25 DIAGNOSIS — I120 Hypertensive chronic kidney disease with stage 5 chronic kidney disease or end stage renal disease: Secondary | ICD-10-CM | POA: Insufficient documentation

## 2020-01-25 DIAGNOSIS — T82858A Stenosis of vascular prosthetic devices, implants and grafts, initial encounter: Secondary | ICD-10-CM | POA: Insufficient documentation

## 2020-01-25 DIAGNOSIS — N186 End stage renal disease: Secondary | ICD-10-CM

## 2020-01-25 DIAGNOSIS — F17210 Nicotine dependence, cigarettes, uncomplicated: Secondary | ICD-10-CM | POA: Insufficient documentation

## 2020-01-25 DIAGNOSIS — T82898A Other specified complication of vascular prosthetic devices, implants and grafts, initial encounter: Secondary | ICD-10-CM

## 2020-01-25 DIAGNOSIS — Z992 Dependence on renal dialysis: Secondary | ICD-10-CM | POA: Insufficient documentation

## 2020-01-25 HISTORY — PX: A/V FISTULAGRAM: CATH118298

## 2020-01-25 HISTORY — PX: PERIPHERAL VASCULAR BALLOON ANGIOPLASTY: CATH118281

## 2020-01-25 LAB — POCT I-STAT, CHEM 8
BUN: 27 mg/dL — ABNORMAL HIGH (ref 6–20)
Calcium, Ion: 1.11 mmol/L — ABNORMAL LOW (ref 1.15–1.40)
Chloride: 97 mmol/L — ABNORMAL LOW (ref 98–111)
Creatinine, Ser: 9.4 mg/dL — ABNORMAL HIGH (ref 0.61–1.24)
Glucose, Bld: 98 mg/dL (ref 70–99)
HCT: 38 % — ABNORMAL LOW (ref 39.0–52.0)
Hemoglobin: 12.9 g/dL — ABNORMAL LOW (ref 13.0–17.0)
Potassium: 4.4 mmol/L (ref 3.5–5.1)
Sodium: 139 mmol/L (ref 135–145)
TCO2: 32 mmol/L (ref 22–32)

## 2020-01-25 LAB — GLUCOSE, CAPILLARY: Glucose-Capillary: 83 mg/dL (ref 70–99)

## 2020-01-25 SURGERY — A/V FISTULAGRAM
Anesthesia: LOCAL

## 2020-01-25 MED ORDER — IODIXANOL 320 MG/ML IV SOLN
INTRAVENOUS | Status: DC | PRN
  Administered 2020-01-25: 50 mL

## 2020-01-25 MED ORDER — MIDAZOLAM HCL 2 MG/2ML IJ SOLN
INTRAMUSCULAR | Status: DC | PRN
  Administered 2020-01-25 (×2): 1 mg via INTRAVENOUS

## 2020-01-25 MED ORDER — LIDOCAINE HCL (PF) 1 % IJ SOLN
INTRAMUSCULAR | Status: AC
  Filled 2020-01-25: qty 30

## 2020-01-25 MED ORDER — HEPARIN (PORCINE) IN NACL 1000-0.9 UT/500ML-% IV SOLN
INTRAVENOUS | Status: DC | PRN
  Administered 2020-01-25: 500 mL

## 2020-01-25 MED ORDER — SODIUM CHLORIDE 0.9% FLUSH: 3.0000 mL | INTRAVENOUS | Status: DC | PRN

## 2020-01-25 MED ORDER — MIDAZOLAM HCL 2 MG/2ML IJ SOLN
INTRAMUSCULAR | Status: AC
  Filled 2020-01-25: qty 2

## 2020-01-25 MED ORDER — FENTANYL CITRATE (PF) 100 MCG/2ML IJ SOLN
INTRAMUSCULAR | Status: AC
  Filled 2020-01-25: qty 2

## 2020-01-25 MED ORDER — FENTANYL CITRATE (PF) 100 MCG/2ML IJ SOLN
INTRAMUSCULAR | Status: DC | PRN
  Administered 2020-01-25 (×2): 25 ug via INTRAVENOUS

## 2020-01-25 MED ORDER — HEPARIN (PORCINE) IN NACL 1000-0.9 UT/500ML-% IV SOLN
INTRAVENOUS | Status: AC
  Filled 2020-01-25: qty 500

## 2020-01-25 MED ORDER — LIDOCAINE HCL (PF) 1 % IJ SOLN
INTRAMUSCULAR | Status: DC | PRN
  Administered 2020-01-25: 2 mL

## 2020-01-25 SURGICAL SUPPLY — 16 items
BAG SNAP BAND KOVER 36X36 (MISCELLANEOUS) ×3 IMPLANT
BALLN ATLAS 14X40X75 (BALLOONS) ×3
BALLN MUSTANG 12.0X40 75 (BALLOONS) ×3
BALLOON ATLAS 14X40X75 (BALLOONS) ×2 IMPLANT
BALLOON MUSTANG 12.0X40 75 (BALLOONS) ×2 IMPLANT
COVER DOME SNAP 22 D (MISCELLANEOUS) ×3 IMPLANT
KIT ENCORE 26 ADVANTAGE (KITS) ×3 IMPLANT
KIT MICROPUNCTURE NIT STIFF (SHEATH) ×3 IMPLANT
PROTECTION STATION PRESSURIZED (MISCELLANEOUS) ×3
SHEATH PINNACLE R/O II 7F 4CM (SHEATH) ×3 IMPLANT
SHEATH PROBE COVER 6X72 (BAG) ×3 IMPLANT
STATION PROTECTION PRESSURIZED (MISCELLANEOUS) ×2 IMPLANT
STOPCOCK MORSE 400PSI 3WAY (MISCELLANEOUS) ×3 IMPLANT
TRAY PV CATH (CUSTOM PROCEDURE TRAY) ×3 IMPLANT
TUBING CIL FLEX 10 FLL-RA (TUBING) ×3 IMPLANT
WIRE BENTSON .035X145CM (WIRE) ×6 IMPLANT

## 2020-01-25 NOTE — Progress Notes (Signed)
Pt states he does not have a ride home.  Benn M[place] notified

## 2020-01-25 NOTE — Op Note (Addendum)
    Patient name: Shaun Burns MRN: [id number] DOB: April 21, 1971 Sex: male  01/25/2020 Pre-operative Diagnosis: ESRD Post-operative diagnosis:  Same Surgeon:  Annamarie Major Procedure Performed:  1.  Ultrasound-guided access, left brachiocephalic fistula  2.  Fistulogram  3.  Percutaneous transluminal venoplasty (central)  4.  Conscious sedation at bedside 26 minutes)    Indications: The patient is having difficulty with dialysis access  Procedure:  The patient was identified in the holding area and taken to room 8.  The patient was then placed supine on the table and prepped and draped in the usual sterile fashion.  A time out was called.  Conscious sedation was administered with the use of IV fentanyl and Versed under continuous physician and nurse monitoring.  Heart rate, blood pressure, and oxygen saturations were continuously monitored.  Total sedation time was 26 minutes ultrasound was used to evaluate the fistula.  The vein was patent and compressible.  A digital ultrasound image was acquired.  The fistula was then accessed under ultrasound guidance using a micropuncture needle.  An 018 wire was then asvanced without resistance and a micropuncture sheath was placed.  Contrast injections were then performed through the sheath.  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  A wire was easily passed across the stenosis.  I first used a 12 x 40 Mustang balloon.  There was minimal waist with balloon inflation.  Follow-up imaging revealed residual stenosis and so a 14 x 40 Atlas balloon was used.  This time there was a small waist which did resolve at 20 atm.  On follow-up imaging there was still a 40% stenosis.  I repeated venoplasty with the 12 x 40 balloon and again there was no waste.  Follow-up imaging revealed persistent 40% lesion.  I did not want to stent this area because of its location and so I elected to stop the  procedure.  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  Due to this location in the thoracic outlet, if the patient continues to have difficulty we would need to consider possible thoracic outlet decompression and stenting.  Theotis Burrow, M.D., [facility] [place] Office: [phone] Pager:  [phone]

## 2020-01-25 NOTE — Discharge Instructions (Signed)

## 2020-01-25 NOTE — Interval H&P Note (Signed)
History and Physical Interval Note:  01/25/2020 10:01 AM  Shaun Burns  has presented today for surgery, with the diagnosis of complication of fistula.  The various methods of treatment have been discussed with the patient and family. After consideration of risks, benefits and other options for treatment, the patient has consented to  Procedure(s): A/V FISTULAGRAM - Left Arm (N/A) as a surgical intervention.  The patient's history has been reviewed, patient examined, no change in status, stable for surgery.  I have reviewed the patient's chart and labs.  Questions were answered to the patient's satisfaction.     Annamarie Major

## 2020-10-16 ENCOUNTER — Encounter (HOSPITAL_COMMUNITY): Payer: Self-pay

## 2020-10-16 ENCOUNTER — Emergency Department (HOSPITAL_COMMUNITY): Payer: Medicare Other

## 2020-10-16 ENCOUNTER — Other Ambulatory Visit: Payer: Self-pay

## 2020-10-16 ENCOUNTER — Inpatient Hospital Stay (HOSPITAL_COMMUNITY)
Admission: EM | Admit: 2020-10-16 | Discharge: 2020-10-19 | DRG: 308 | Disposition: A | Discharge status: Home / Self Care | Payer: Medicare Other | Attending: Internal Medicine | Admitting: Internal Medicine

## 2020-10-16 DIAGNOSIS — M898X9 Other specified disorders of bone, unspecified site: Secondary | ICD-10-CM | POA: Diagnosis present

## 2020-10-16 DIAGNOSIS — Z7982 Long term (current) use of aspirin: Secondary | ICD-10-CM

## 2020-10-16 DIAGNOSIS — E1122 Type 2 diabetes mellitus with diabetic chronic kidney disease: Secondary | ICD-10-CM | POA: Diagnosis present

## 2020-10-16 DIAGNOSIS — Z992 Dependence on renal dialysis: Secondary | ICD-10-CM

## 2020-10-16 DIAGNOSIS — Z20822 Contact with and (suspected) exposure to covid-19: Secondary | ICD-10-CM | POA: Diagnosis present

## 2020-10-16 DIAGNOSIS — Z9114 Patient's other noncompliance with medication regimen: Secondary | ICD-10-CM

## 2020-10-16 DIAGNOSIS — I10 Essential (primary) hypertension: Secondary | ICD-10-CM | POA: Diagnosis not present

## 2020-10-16 DIAGNOSIS — N2581 Secondary hyperparathyroidism of renal origin: Secondary | ICD-10-CM | POA: Diagnosis present

## 2020-10-16 DIAGNOSIS — Z951 Presence of aortocoronary bypass graft: Secondary | ICD-10-CM | POA: Diagnosis not present

## 2020-10-16 DIAGNOSIS — I248 Other forms of acute ischemic heart disease: Secondary | ICD-10-CM | POA: Diagnosis not present

## 2020-10-16 DIAGNOSIS — K219 Gastro-esophageal reflux disease without esophagitis: Secondary | ICD-10-CM | POA: Diagnosis not present

## 2020-10-16 DIAGNOSIS — R778 Other specified abnormalities of plasma proteins: Secondary | ICD-10-CM | POA: Diagnosis not present

## 2020-10-16 DIAGNOSIS — Z7901 Long term (current) use of anticoagulants: Secondary | ICD-10-CM | POA: Diagnosis not present

## 2020-10-16 DIAGNOSIS — Z720 Tobacco use: Secondary | ICD-10-CM | POA: Diagnosis not present

## 2020-10-16 DIAGNOSIS — I2510 Atherosclerotic heart disease of native coronary artery without angina pectoris: Secondary | ICD-10-CM | POA: Diagnosis not present

## 2020-10-16 DIAGNOSIS — N186 End stage renal disease: Secondary | ICD-10-CM | POA: Diagnosis present

## 2020-10-16 DIAGNOSIS — Z79899 Other long term (current) drug therapy: Secondary | ICD-10-CM | POA: Diagnosis not present

## 2020-10-16 DIAGNOSIS — E785 Hyperlipidemia, unspecified: Secondary | ICD-10-CM | POA: Diagnosis present

## 2020-10-16 DIAGNOSIS — Z833 Family history of diabetes mellitus: Secondary | ICD-10-CM

## 2020-10-16 DIAGNOSIS — I120 Hypertensive chronic kidney disease with stage 5 chronic kidney disease or end stage renal disease: Secondary | ICD-10-CM | POA: Diagnosis present

## 2020-10-16 DIAGNOSIS — Z91148 Patient's other noncompliance with medication regimen for other reason: Secondary | ICD-10-CM

## 2020-10-16 DIAGNOSIS — F17210 Nicotine dependence, cigarettes, uncomplicated: Secondary | ICD-10-CM | POA: Diagnosis present

## 2020-10-16 DIAGNOSIS — D631 Anemia in chronic kidney disease: Secondary | ICD-10-CM | POA: Diagnosis present

## 2020-10-16 DIAGNOSIS — I482 Chronic atrial fibrillation, unspecified: Secondary | ICD-10-CM | POA: Diagnosis present

## 2020-10-16 DIAGNOSIS — I428 Other cardiomyopathies: Secondary | ICD-10-CM | POA: Diagnosis not present

## 2020-10-16 DIAGNOSIS — I4821 Permanent atrial fibrillation: Principal | ICD-10-CM | POA: Diagnosis present

## 2020-10-16 DIAGNOSIS — I4891 Unspecified atrial fibrillation: Secondary | ICD-10-CM | POA: Diagnosis present

## 2020-10-16 DIAGNOSIS — Z823 Family history of stroke: Secondary | ICD-10-CM | POA: Diagnosis not present

## 2020-10-16 DIAGNOSIS — E119 Type 2 diabetes mellitus without complications: Secondary | ICD-10-CM

## 2020-10-16 DIAGNOSIS — Z8249 Family history of ischemic heart disease and other diseases of the circulatory system: Secondary | ICD-10-CM | POA: Diagnosis not present

## 2020-10-16 HISTORY — DX: Atherosclerotic heart disease of native coronary artery without angina pectoris: I25.10

## 2020-10-16 LAB — CBC
HCT: 41.3 % (ref 39.0–52.0)
Hemoglobin: 13.2 g/dL (ref 13.0–17.0)
MCH: 27.9 pg (ref 26.0–34.0)
MCHC: 32 g/dL (ref 30.0–36.0)
MCV: 87.3 fL (ref 80.0–100.0)
Platelets: 307 10*3/uL (ref 150–400)
RBC: 4.73 MIL/uL (ref 4.22–5.81)
RDW: 15.7 % — ABNORMAL HIGH (ref 11.5–15.5)
WBC: 9.4 10*3/uL (ref 4.0–10.5)
nRBC: 0 % (ref 0.0–0.2)

## 2020-10-16 LAB — GLUCOSE, CAPILLARY: Glucose-Capillary: 98 mg/dL (ref 70–99)

## 2020-10-16 LAB — BRAIN NATRIURETIC PEPTIDE: B Natriuretic Peptide: 1898 pg/mL — ABNORMAL HIGH (ref 0.0–100.0)

## 2020-10-16 LAB — RENAL FUNCTION PANEL
Albumin: 3.8 g/dL (ref 3.5–5.0)
Anion gap: 17 — ABNORMAL HIGH (ref 5–15)
BUN: 40 mg/dL — ABNORMAL HIGH (ref 6–20)
CO2: 25 mmol/L (ref 22–32)
Calcium: 8.4 mg/dL — ABNORMAL LOW (ref 8.9–10.3)
Chloride: 94 mmol/L — ABNORMAL LOW (ref 98–111)
Creatinine, Ser: 13 mg/dL — ABNORMAL HIGH (ref 0.61–1.24)
GFR, Estimated: 4 mL/min — ABNORMAL LOW (ref >60)
Glucose, Bld: 93 mg/dL (ref 70–99)
Phosphorus: 8.4 mg/dL — ABNORMAL HIGH (ref 2.5–4.6)
Potassium: 4.5 mmol/L (ref 3.5–5.1)
Sodium: 136 mmol/L (ref 135–145)

## 2020-10-16 LAB — RESP PANEL BY RT PCR (RSV, FLU A&B, COVID)
Influenza A by PCR: NEGATIVE
Influenza B by PCR: NEGATIVE
Respiratory Syncytial Virus by PCR: NEGATIVE
SARS Coronavirus 2 by RT PCR: NEGATIVE

## 2020-10-16 LAB — BASIC METABOLIC PANEL
Anion gap: 19 — ABNORMAL HIGH (ref 5–15)
BUN: 39 mg/dL — ABNORMAL HIGH (ref 6–20)
CO2: 27 mmol/L (ref 22–32)
Calcium: 8.6 mg/dL — ABNORMAL LOW (ref 8.9–10.3)
Chloride: 92 mmol/L — ABNORMAL LOW (ref 98–111)
Creatinine, Ser: 12.72 mg/dL — ABNORMAL HIGH (ref 0.61–1.24)
GFR, Estimated: 4 mL/min — ABNORMAL LOW (ref >60)
Glucose, Bld: 113 mg/dL — ABNORMAL HIGH (ref 70–99)
Potassium: 4.8 mmol/L (ref 3.5–5.1)
Sodium: 138 mmol/L (ref 135–145)

## 2020-10-16 LAB — TROPONIN I (HIGH SENSITIVITY): Troponin I (High Sensitivity): 520 ng/L (ref <18)

## 2020-10-16 LAB — CBG MONITORING, ED: Glucose-Capillary: 243 mg/dL — ABNORMAL HIGH (ref 70–99)

## 2020-10-16 LAB — HIV ANTIBODY (ROUTINE TESTING W REFLEX): HIV Screen 4th Generation wRfx: NONREACTIVE

## 2020-10-16 LAB — MAGNESIUM: Magnesium: 2 mg/dL (ref 1.7–2.4)

## 2020-10-16 MED ORDER — RENA-VITE PO TABS
1.0000 | ORAL_TABLET | Freq: Every day | ORAL | Status: DC
  Administered 2020-10-16 – 2020-10-19 (×4): 1 via ORAL
  Filled 2020-10-16 (×6): qty 1

## 2020-10-16 MED ORDER — DILTIAZEM HCL ER COATED BEADS 240 MG PO CP24
240.0000 mg | ORAL_CAPSULE | Freq: Every day | ORAL | Status: DC
  Filled 2020-10-16 (×3): qty 1

## 2020-10-16 MED ORDER — ALTEPLASE 2 MG IJ SOLR
2.0000 mg | Freq: Once | INTRAMUSCULAR | Status: DC | PRN
  Filled 2020-10-16: qty 2

## 2020-10-16 MED ORDER — ALBUTEROL SULFATE HFA 108 (90 BASE) MCG/ACT IN AERS: 2.0000 | INHALATION_SPRAY | RESPIRATORY_TRACT | Status: DC | PRN

## 2020-10-16 MED ORDER — ONDANSETRON HCL 4 MG PO TABS: 4.0000 mg | ORAL_TABLET | Freq: Four times a day (QID) | ORAL | Status: DC | PRN

## 2020-10-16 MED ORDER — SEVELAMER CARBONATE 800 MG PO TABS
800.0000 mg | ORAL_TABLET | Freq: Three times a day (TID) | ORAL | Status: DC
  Administered 2020-10-16 – 2020-10-19 (×9): 800 mg via ORAL
  Filled 2020-10-16 (×14): qty 1

## 2020-10-16 MED ORDER — APIXABAN 5 MG PO TABS
5.0000 mg | ORAL_TABLET | Freq: Two times a day (BID) | ORAL | Status: DC
  Administered 2020-10-16 – 2020-10-19 (×6): 5 mg via ORAL
  Filled 2020-10-16 (×6): qty 1

## 2020-10-16 MED ORDER — SODIUM CHLORIDE 0.9 % IV SOLN: 100.0000 mL | INTRAVENOUS | Status: DC | PRN

## 2020-10-16 MED ORDER — ONDANSETRON HCL 4 MG/2ML IJ SOLN: 4.0000 mg | Freq: Four times a day (QID) | INTRAMUSCULAR | Status: DC | PRN

## 2020-10-16 MED ORDER — NICOTINE 14 MG/24HR TD PT24: 14.0000 mg | MEDICATED_PATCH | Freq: Every day | TRANSDERMAL | Status: DC | PRN

## 2020-10-16 MED ORDER — LIDOCAINE-PRILOCAINE 2.5-2.5 % EX CREA: 1.0000 "application " | TOPICAL_CREAM | CUTANEOUS | Status: DC | PRN

## 2020-10-16 MED ORDER — CHLORHEXIDINE GLUCONATE CLOTH 2 % EX PADS
6.0000 | MEDICATED_PAD | Freq: Every day | CUTANEOUS | Status: DC
  Administered 2020-10-18 – 2020-10-19 (×2): 6 via TOPICAL

## 2020-10-16 MED ORDER — PENTAFLUOROPROP-TETRAFLUOROETH EX AERO: 1.0000 "application " | INHALATION_SPRAY | CUTANEOUS | Status: DC | PRN

## 2020-10-16 MED ORDER — AMLODIPINE BESYLATE 5 MG PO TABS: 5.0000 mg | ORAL_TABLET | Freq: Every day | ORAL | Status: DC

## 2020-10-16 MED ORDER — ISOSORBIDE MONONITRATE ER 60 MG PO TB24
60.0000 mg | ORAL_TABLET | Freq: Every day | ORAL | Status: DC
  Administered 2020-10-17 – 2020-10-19 (×3): 60 mg via ORAL
  Filled 2020-10-16 (×5): qty 1

## 2020-10-16 MED ORDER — ATORVASTATIN CALCIUM 40 MG PO TABS
40.0000 mg | ORAL_TABLET | Freq: Every evening | ORAL | Status: DC
  Administered 2020-10-16 – 2020-10-18 (×3): 40 mg via ORAL
  Filled 2020-10-16 (×3): qty 1

## 2020-10-16 MED ORDER — CARVEDILOL 12.5 MG PO TABS
12.5000 mg | ORAL_TABLET | Freq: Two times a day (BID) | ORAL | Status: DC
  Administered 2020-10-16 – 2020-10-19 (×6): 12.5 mg via ORAL
  Filled 2020-10-16 (×6): qty 1

## 2020-10-16 MED ORDER — ASPIRIN EC 81 MG PO TBEC
81.0000 mg | DELAYED_RELEASE_TABLET | Freq: Two times a day (BID) | ORAL | Status: DC
  Administered 2020-10-16 – 2020-10-17 (×3): 81 mg via ORAL
  Filled 2020-10-16 (×3): qty 1

## 2020-10-16 MED ORDER — ACETAMINOPHEN 650 MG RE SUPP: 650.0000 mg | Freq: Four times a day (QID) | RECTAL | Status: DC | PRN

## 2020-10-16 MED ORDER — DILTIAZEM HCL 25 MG/5ML IV SOLN
10.0000 mg | Freq: Once | INTRAVENOUS | Status: AC
  Administered 2020-10-16: 10 mg via INTRAVENOUS
  Filled 2020-10-16: qty 5

## 2020-10-16 MED ORDER — LIDOCAINE HCL (PF) 1 % IJ SOLN: 5.0000 mL | INTRAMUSCULAR | Status: DC | PRN

## 2020-10-16 MED ORDER — CARVEDILOL 3.125 MG PO TABS
6.2500 mg | ORAL_TABLET | Freq: Two times a day (BID) | ORAL | Status: DC
  Administered 2020-10-16: 6.25 mg via ORAL
  Filled 2020-10-16: qty 2

## 2020-10-16 MED ORDER — BISACODYL 5 MG PO TBEC: 5.0000 mg | DELAYED_RELEASE_TABLET | Freq: Every day | ORAL | Status: DC | PRN

## 2020-10-16 MED ORDER — INSULIN ASPART 100 UNIT/ML ~~LOC~~ SOLN: 0.0000 [IU] | Freq: Three times a day (TID) | SUBCUTANEOUS | Status: DC

## 2020-10-16 MED ORDER — ACETAMINOPHEN 325 MG PO TABS: 650.0000 mg | ORAL_TABLET | Freq: Four times a day (QID) | ORAL | Status: DC | PRN

## 2020-10-16 MED ORDER — HEPARIN SODIUM (PORCINE) 1000 UNIT/ML DIALYSIS
1000.0000 [IU] | INTRAMUSCULAR | Status: DC | PRN
  Filled 2020-10-16: qty 1

## 2020-10-16 NOTE — ED Notes (Signed)
Date and time results received: 10/16/20 1110 (use smartphrase ".now" to insert current time)  Test: Troponin Critical Value: 520  Name of Provider Notified: Nyoka Cowden, PA  Orders Received? Or Actions Taken?:

## 2020-10-16 NOTE — Progress Notes (Signed)
1845: Pt arrived to room #334 via wheelchair from ED. Pt oriented to room and safety procedures.  1910: VSS, pt denies c/o.

## 2020-10-16 NOTE — ED Provider Notes (Signed)
[facility] EMERGENCY DEPARTMENT Provider Note   CSN: [id number] Arrival date & time: 10/16/20  9562     History Chief Complaint  Patient presents with  . Irregular Heart Beat    Recently diagnosed with A-fib with inpatient stay discharged Thursday. Unable to start PO Cardizem at home. To dialysis this am and they refused to treat him due to "unstable A-fib." EMS reports rate100-120. Last dialysis treatment Friday. Reports chronic SHOB and states that he can feel rapid heartbeat.     Shaun Burns is a 49 y.o. male with PMH significant for ESRD on HD MWF, HTN, HLD, 15-pack-year smoking history, and CAD and triple bypass who presents to the ED via EMS from dialysis after they noted him to be in atrial fibrillation with RVR.  Cardiac catheterization performed 06/05/2020 demonstrated multivessel disease.  Patient was recently admitted to the hospital at [facility] after he was noted to be in atrial fibrillation with RVR.  He has a history of paroxysmal atrial fibrillation and is supposed to be on Eliquis, but only takes 81 mg aspirin twice daily.  He was also diagnosed with epididymitis despite lack of any inflammatory changes or other acute pathology on scrotal ultrasound.  He was treated empirically with Rocephin and discharged home with doxycycline, however he reports that he has not yet filled his prescriptions.  On my examination, patient states that shortly after waking up this morning he noticed bothersome palpitations with mild associated shortness of breath symptoms.  He reports that he has had multiple mini heart attacks in the past year that have felt much different.  He denies any significant chest pain symptoms or difficulty breathing.  He did have a single episode of nonbloody emesis this morning.  He was recently discharged home from the hospital with multiple medications, but has not yet filled.  He states he has been taking Eliquis, but for only 1-2 weeks.  He felt entirely  asymptomatic last evening prior to going to bed.  He denies any orthopnea, fevers or chills, cough, abdominal pain, current nausea or diaphoresis, or other symptoms.  Patient states that he does not drink alcohol.    Patient is not the best historian as he cannot tell me which medications he takes specifically.  Unsure as to whether or not he takes Cardizem, but per prior notes it seems as though it has been prescribed recently and compliance is unclear.  He did not bring his medications with him today as he thought he would be able to go to hemodialysis and then return home.  HPI     Past Medical History:  Diagnosis Date  . Chronic kidney disease   . Diabetes mellitus without complication ([place])   . GERD (gastroesophageal reflux disease)   . Headache   . Hypertension     Patient Active Problem List   Diagnosis Date Noted  . ESRD on dialysis [facility]) 01/04/2016    Past Surgical History:  Procedure Laterality Date  . A/V FISTULAGRAM N/A 08/13/2018   Procedure: A/V FISTULAGRAM;  Surgeon: Marty Heck, MD;  Location: [place] [facility];  Service: Cardiovascular;  Laterality: N/A;  . A/V FISTULAGRAM N/A 01/25/2020   Procedure: A/V FISTULAGRAM - Left Arm;  Surgeon: Serafina Mitchell, MD;  Location: [place] [facility];  Service: Cardiovascular;  Laterality: N/A;  . AV FISTULA PLACEMENT Left 03/30/2015   Procedure: LEFT BRACHIOCEPHALIC ARTERIOVENOUS (AV) FISTULA CREATION;  Surgeon: Elam Dutch, MD;  Location: [facility];  Service: Vascular;  Laterality: Left;  .  KNEE SURGERY    . PERIPHERAL VASCULAR BALLOON ANGIOPLASTY Left 08/13/2018   Procedure: PERIPHERAL VASCULAR BALLOON ANGIOPLASTY;  Surgeon: Marty Heck, MD;  Location: [place] [facility];  Service: Cardiovascular;  Laterality: Left;  central venous   . PERIPHERAL VASCULAR BALLOON ANGIOPLASTY Left 01/25/2020   Procedure: PERIPHERAL VASCULAR BALLOON ANGIOPLASTY;  Surgeon: Serafina Mitchell, MD;  Location: [place] [facility];   Service: Cardiovascular;  Laterality: Left;  arm fistula  . PERIPHERAL VASCULAR CATHETERIZATION Left 01/11/2016   Procedure: A/V Shuntogram/Fistulagram;  Surgeon: Conrad Waco, MD;  Location: [place] [facility];  Service: Cardiovascular;  Laterality: Left;       Family History  Problem Relation Age of Onset  . Hypertension Father   . Stroke Father   . Diabetes Mother     Social History   Tobacco Use  . Smoking status: Current Every Day Smoker    Packs/day: 0.50    Types: Cigarettes  . Smokeless tobacco: Never Used  Substance Use Topics  . Alcohol use: No    Alcohol/week: 0.0 standard drinks  . Drug use: No    Home Medications Prior to Admission medications   Medication Sig Start Date End Date Taking? Authorizing Provider  acetaminophen (TYLENOL) 500 MG tablet Take 1,500 mg by mouth 2 (two) times daily as needed for moderate pain.    Yes [provider]  albuterol (VENTOLIN HFA) 108 (90 Base) MCG/ACT inhaler Inhale 2 puffs into the lungs as needed.   Yes [provider]  amLODipine (NORVASC) 5 MG tablet Take 5 mg by mouth daily. 07/25/18  Yes [provider]  apixaban (ELIQUIS) 2.5 MG TABS tablet Take 2.5 mg by mouth daily. 07/24/20 07/24/21 Yes [provider]  aspirin EC 81 MG tablet Take 81 mg by mouth 2 (two) times daily.  07/08/18  Yes [provider]  atorvastatin (LIPITOR) 40 MG tablet Take 40 mg by mouth every evening.   Yes [provider]  carvedilol (COREG) 12.5 MG tablet Take 12.5 mg by mouth 2 (two) times daily. 10/12/20  Yes [provider]  ciprofloxacin (CIPRO) 500 MG tablet Take 500 mg by mouth 2 (two) times daily. 01/11/20  Yes [provider]  diltiazem (CARDIZEM CD) 240 MG 24 hr capsule Take 240 mg by mouth daily. 10/12/20  Yes [provider]  doxycycline (VIBRAMYCIN) 100 MG capsule Take 100 mg by mouth in the morning and at bedtime. 10/04/20 10/18/20 Yes [provider]    isosorbide mononitrate (IMDUR) 60 MG 24 hr tablet Take 60 mg by mouth daily. 08/24/20  Yes [provider]  multivitamin (RENA-VIT) TABS tablet Take 1 tablet by mouth daily. 07/15/18  Yes [provider]  sevelamer carbonate (RENVELA) 800 MG tablet Take 800 mg by mouth 3 (three) times daily. 10/12/20  Yes [provider]  HYSEPT 0.25 % SOLN Apply 1 application topically 2 (two) times daily. Apply 1 L top use with wet to dry dressing Patient not taking: Reported on 10/16/2020 01/11/20   [provider]    Allergies    Patient has no known allergies.  Review of Systems   Review of Systems  All other systems reviewed and are negative.   Physical Exam Updated Vital Signs BP (!) 119/93   Pulse 70   Temp 98.4 F (36.9 C) (Oral)   Resp 16   Ht 5\' 10"  (1.778 m)   Wt 93 kg   SpO2 100%   BMI 29.41 kg/m  Physical Exam Vitals and nursing note reviewed. Exam conducted with a chaperone present.  Constitutional:      Appearance: He is not ill-appearing.  HENT:     Head: Normocephalic and atraumatic.  Eyes:     General: No scleral icterus.    Conjunctiva/sclera: Conjunctivae normal.  Neck:     Comments: No JVD. Cardiovascular:     Rate and Rhythm: Tachycardia present. Rhythm irregular.     Pulses: Normal pulses.  Pulmonary:     Effort: Pulmonary effort is normal. No respiratory distress.     Breath sounds: Normal breath sounds. No wheezing or rales.  Abdominal:     General: Abdomen is flat. There is no distension.     Palpations: Abdomen is soft.     Tenderness: There is no abdominal tenderness. There is no guarding.  Musculoskeletal:     Cervical back: Normal range of motion. No rigidity.     Comments: Very mild 1+ edema  Skin:    General: Skin is dry.     Capillary Refill: Capillary refill takes less than 2 seconds.  Neurological:     Mental Status: He is alert and oriented to person, place, and time.     GCS: GCS eye subscore is 4. GCS  verbal subscore is 5. GCS motor subscore is 6.  Psychiatric:        Mood and Affect: Mood normal.        Behavior: Behavior normal.        Thought Content: Thought content normal.     ED Results / Procedures / Treatments   Labs (all labs ordered are listed, but only abnormal results are displayed) Labs Reviewed  BASIC METABOLIC PANEL - Abnormal; Notable for the following components:      Result Value   Chloride 92 (*)    Glucose, Bld 113 (*)    BUN 39 (*)    Creatinine, Ser 12.72 (*)    Calcium 8.6 (*)    GFR, Estimated 4 (*)    Anion gap 19 (*)    All other components within normal limits  BRAIN NATRIURETIC PEPTIDE - Abnormal; Notable for the following components:   B Natriuretic Peptide 1,898.0 (*)    All other components within normal limits  TROPONIN I (HIGH SENSITIVITY) - Abnormal; Notable for the following components:   Troponin I (High Sensitivity) 520 (*)    All other components within normal limits  RESP PANEL BY RT PCR (RSV, FLU A&B, COVID)  MAGNESIUM  CBC  TROPONIN I (HIGH SENSITIVITY)    EKG EKG Interpretation  Date/Time:  Monday October 16 2020 08:53:07 EDT Ventricular Rate:  129 PR Interval:    QRS Duration: 100 QT Interval:  303 QTC Calculation: 444 R Axis:   5 Text Interpretation: Atrial fibrillation RVR Anterior infarct, old Nonspecific repol abnormality, lateral leads Confirmed by Elnora Morrison [phone]) on 10/16/2020 9:12:50 AM   Radiology DG Chest Port 1 View  Result Date: 10/16/2020 CLINICAL DATA:  Palpitations. Shortness of breath. Atrial fibrillation. EXAM: PORTABLE CHEST 1 VIEW COMPARISON:  None. FINDINGS: The heart size and mediastinal contours are within normal limits. Prior CABG again noted. Aortic atherosclerotic calcification noted. Both lungs are clear. The visualized skeletal structures are unremarkable. IMPRESSION: No active disease. Electronically Signed   By: Marlaine Hind M.D.   On: 10/16/2020 09:54    Procedures Procedures  (including critical care time)  Medications Ordered in ED Medications  diltiazem (CARDIZEM) injection 10 mg (10 mg Intravenous Given 10/16/20 1021)  ED Course  I have reviewed the triage vital signs and the nursing notes.  Pertinent labs & imaging results that were available during my care of the patient were reviewed by me and considered in my medical decision making (see chart for details).  Clinical Course as of Oct 17 1199  Mon Oct 16, 2020  1153 Spoke with Dr. Wynetta Emery, hospitalist, who will see and admit patient.  Asked that we reach out to nephro on-call as patient will require HD in the next 24 hours.   [GG]  1158 Spoke with Dr. Marval Regal, nephrology, who will add patient to his list.   [GG]    Clinical Course User Index [GG] Corena Herter, PA-C   MDM Rules/Calculators/A&P                          EKG is personally reviewed and demonstrates atrial fibrillation with RVR.  Patient is not a candidate for cardioversion given brevity of Eliquis use.  We will obtain basic laboratory work-up and plain films of chest for further evaluation of his A. fib with RVR.  Patient is overall stable and does not appear to be in any acute distress.  Will add troponin given his high risk severe cardiac disease and poor medication compliance.    Labs Troponin: 520 >>  CBC:  BMP: Consistent with his baseline of end-stage renal disease. Magnesium: 2.0. BNP: Elevated to 1898. Respiratory panel by PCR: Pan-negative.  DG chest portable one view was personally reviewed and demonstrates no acute cardiopulmonary disease.  On subsequent evaluation, approximately 30 minutes after administration of Cardizem bolus, patient was still in rapid ventricular rate in 130s, contrary to the documented heart rate of 72 from 20 minutes prior.  He is still symptomatic with palpitations.  Will consult hospitalist for admission given symptomatic atrial fibrillation with RVR with mild fluid-overload status  likely given his ESRD on HD with missed dialysis appointment this morning.  Elevated troponin likely due to demand, but will continue to trend.   Spoke with Dr. Wynetta Emery, hospitalist, who will see and admit patient.  Asked that we reach out to nephro on-call as patient will require HD in the next 24 hours.  Spoke with Dr. Marval Regal, nephrology, who will add patient to his list.   Final Clinical Impression(s) / ED Diagnoses Final diagnoses:  Atrial fibrillation with RVR [facility])    Rx / DC Orders ED Discharge Orders    None       Corena Herter, PA-C 10/16/20 1200    Elnora Morrison, MD 10/19/20 [phone]

## 2020-10-16 NOTE — Plan of Care (Signed)
  Problem: Health Behavior/Discharge Planning: Goal: Ability to manage health-related needs will improve Outcome: Progressing   Problem: Clinical Measurements: Goal: Ability to maintain clinical measurements within normal limits will improve Outcome: Progressing Goal: Will remain free from infection Outcome: Progressing   

## 2020-10-16 NOTE — H&P (Signed)
History and Physical  [place] OEV:[id number] DOB: [birth year] DOA: 10/16/2020  PCP: Briant Sites, PA-C  Patient coming from: dialysis clinic  I have personally briefly reviewed patient's old medical records in [place]  Chief Complaint: palpitations   HPI: Shaun Burns is a 49 y.o. male with medical history significant for coronary artery disease status post CABG in 2019, end-stage renal disease on hemodialysis Tuesday Thursday Saturday with an AV fistula of left upper extremity, type 2 diabetes mellitus, chronic tobacco use, hypertension and poor compliance with taking medications was recently discharged from [facility] was seen today for regular scheduled hemodialysis and noted to have atrial fibrillation with RVR and his dialysis was canceled and he was sent to the emergency department for further evaluation.  The patient reports that he has new cardiac medications that were prescribed during his last hospitalization but he has not been able to afford to get the medications due to being on a fixed income.  He reports that he has been having palpitations.  He denies having shortness of breath.  He denies fever and chills.Marland Kitchen  He reports that he took some of his medications this morning but is not 100% sure of all of the medications that he took.  He believes he took aspirin Coreg and atorvastatin.  ED Course: He was in atrial fibrillation with RVR and soft blood pressures on arrival with a heart rate in the 130s.  He was given 10 mg of cardizem IV in ED and his heart rate improved to 100-115.  His labs were noted to be stable.  Admission was requested for further management of his atrial fibrillation with RVR.    Review of Systems: As per HPI otherwise 10 point review of systems negative.   Past Medical History:  Diagnosis Date  . Chronic kidney disease   . Diabetes mellitus without complication ([place])   . GERD (gastroesophageal reflux  disease)   . Headache   . Hypertension     Past Surgical History:  Procedure Laterality Date  . A/V FISTULAGRAM N/A 08/13/2018   Procedure: A/V FISTULAGRAM;  Surgeon: Marty Heck, MD;  Location: [place] [facility];  Service: Cardiovascular;  Laterality: N/A;  . A/V FISTULAGRAM N/A 01/25/2020   Procedure: A/V FISTULAGRAM - Left Arm;  Surgeon: Serafina Mitchell, MD;  Location: [place] [facility];  Service: Cardiovascular;  Laterality: N/A;  . AV FISTULA PLACEMENT Left 03/30/2015   Procedure: LEFT BRACHIOCEPHALIC ARTERIOVENOUS (AV) FISTULA CREATION;  Surgeon: Elam Dutch, MD;  Location: [place];  Service: Vascular;  Laterality: Left;  . KNEE SURGERY    . PERIPHERAL VASCULAR BALLOON ANGIOPLASTY Left 08/13/2018   Procedure: PERIPHERAL VASCULAR BALLOON ANGIOPLASTY;  Surgeon: Marty Heck, MD;  Location: [place] [facility];  Service: Cardiovascular;  Laterality: Left;  central venous   . PERIPHERAL VASCULAR BALLOON ANGIOPLASTY Left 01/25/2020   Procedure: PERIPHERAL VASCULAR BALLOON ANGIOPLASTY;  Surgeon: Serafina Mitchell, MD;  Location: [place] [facility];  Service: Cardiovascular;  Laterality: Left;  arm fistula  . PERIPHERAL VASCULAR CATHETERIZATION Left 01/11/2016   Procedure: A/V Shuntogram/Fistulagram;  Surgeon: Conrad Crosslake, MD;  Location: [place] [facility];  Service: Cardiovascular;  Laterality: Left;     reports that he has been smoking cigarettes. He has been smoking about 0.50 packs per day. He has never used smokeless tobacco. He reports that he does not drink alcohol and does not use drugs.  No Known Allergies  Family History  Problem Relation Age of Onset  . Hypertension Father   . Stroke Father   . Diabetes Mother      Prior to Admission medications   Medication Sig Start Date End Date Taking? Authorizing Provider  acetaminophen (TYLENOL) 500 MG tablet Take 1,500 mg by mouth 2 (two) times daily as needed for moderate pain.    Yes [provider]    albuterol (VENTOLIN HFA) 108 (90 Base) MCG/ACT inhaler Inhale 2 puffs into the lungs as needed.   Yes [provider]  amLODipine (NORVASC) 5 MG tablet Take 5 mg by mouth daily. 07/25/18  Yes [provider]  apixaban (ELIQUIS) 2.5 MG TABS tablet Take 2.5 mg by mouth daily. 07/24/20 07/24/21 Yes [provider]  aspirin EC 81 MG tablet Take 81 mg by mouth 2 (two) times daily.  07/08/18  Yes [provider]  atorvastatin (LIPITOR) 40 MG tablet Take 40 mg by mouth every evening.   Yes [provider]  carvedilol (COREG) 12.5 MG tablet Take 12.5 mg by mouth 2 (two) times daily. 10/12/20  Yes [provider]  ciprofloxacin (CIPRO) 500 MG tablet Take 500 mg by mouth 2 (two) times daily. 01/11/20  Yes [provider]  diltiazem (CARDIZEM CD) 240 MG 24 hr capsule Take 240 mg by mouth daily. 10/12/20  Yes [provider]  doxycycline (VIBRAMYCIN) 100 MG capsule Take 100 mg by mouth in the morning and at bedtime. 10/04/20 10/18/20 Yes [provider]  isosorbide mononitrate (IMDUR) 60 MG 24 hr tablet Take 60 mg by mouth daily. 08/24/20  Yes [provider]  multivitamin (RENA-VIT) TABS tablet Take 1 tablet by mouth daily. 07/15/18  Yes [provider]  sevelamer carbonate (RENVELA) 800 MG tablet Take 800 mg by mouth 3 (three) times daily. 10/12/20  Yes [provider]    Physical Exam: Vitals:   10/16/20 1200 10/16/20 1230 10/16/20 1232 10/16/20 1236  BP: 131/90 134/79 134/79   Pulse:  (!) 137 (!) 107   Resp: 16 16    Temp:      TempSrc:      SpO2:    92%  Weight:      Height:       Constitutional: NAD, chronically ill appearing.  Speaking full sentences.  Eyes: PERRL, lids and conjunctivae normal ENMT: Mucous membranes are moist. Posterior pharynx clear of any exudate or lesions.  Neck: normal, supple, no masses, no thyromegaly Respiratory: no wheezing, no crackles. Normal respiratory effort.  No accessory muscle use.  Cardiovascular: irregularly irregular, tachycardic rate.  trace extremity edema. 2+ pedal pulses. No carotid bruits.  Abdomen: no tenderness, no masses palpated. No hepatosplenomegaly. Bowel sounds positive.  Musculoskeletal: left av fistula, no clubbing / cyanosis. No joint deformity upper and lower extremities. Good ROM, no contractures. Normal muscle tone.  Skin: no rashes, lesions, ulcers. No induration Neurologic: CN 2-12 grossly intact. Sensation intact, DTR normal. Strength 5/5 in all 4.  Psychiatric: Alert and oriented x 3. Normal mood.   Labs on Admission: I have personally reviewed following labs and imaging studies  CBC: No results for input(s): WBC, NEUTROABS, HGB, HCT, MCV, PLT in the last 168 hours. Basic Metabolic Panel: Recent Labs  Lab 10/16/20 1007  NA 138  K 4.8  CL 92*  CO2 27  GLUCOSE 113*  BUN 39*  CREATININE 12.72*  CALCIUM 8.6*  MG 2.0   GFR: Estimated Creatinine Clearance: 8.1 mL/min (A) (by C-G formula based on SCr  of 12.72 mg/dL (H)). Liver Function Tests: No results for input(s): AST, ALT, ALKPHOS, BILITOT, PROT, ALBUMIN in the last 168 hours. No results for input(s): LIPASE, AMYLASE in the last 168 hours. No results for input(s): AMMONIA in the last 168 hours. Coagulation Profile: No results for input(s): INR, PROTIME in the last 168 hours. Cardiac Enzymes: No results for input(s): CKTOTAL, CKMB, CKMBINDEX, TROPONINI in the last 168 hours. BNP (last 3 results) No results for input(s): PROBNP in the last 8760 hours. HbA1C: No results for input(s): HGBA1C in the last 72 hours. CBG: No results for input(s): GLUCAP in the last 168 hours. Lipid Profile: No results for input(s): CHOL, HDL, LDLCALC, TRIG, CHOLHDL, LDLDIRECT in the last 72 hours. Thyroid Function Tests: No results for input(s): TSH, T4TOTAL, FREET4, T3FREE, THYROIDAB in the last 72 hours. Anemia Panel: No results for input(s): VITAMINB12, FOLATE, FERRITIN,  TIBC, IRON, RETICCTPCT in the last 72 hours. Urine analysis: No results found for: COLORURINE, APPEARANCEUR, LABSPEC, PHURINE, GLUCOSEU, HGBUR, BILIRUBINUR, KETONESUR, PROTEINUR, UROBILINOGEN, NITRITE, LEUKOCYTESUR  Radiological Exams on Admission: DG Chest Port 1 View  Result Date: 10/16/2020 CLINICAL DATA:  Palpitations. Shortness of breath. Atrial fibrillation. EXAM: PORTABLE CHEST 1 VIEW COMPARISON:  None. FINDINGS: The heart size and mediastinal contours are within normal limits. Prior CABG again noted. Aortic atherosclerotic calcification noted. Both lungs are clear. The visualized skeletal structures are unremarkable. IMPRESSION: No active disease. Electronically Signed   By: Marlaine Hind M.D.   On: 10/16/2020 09:54   EKG: Independently reviewed. Atrial fibrillation with RVR  Assessment/Plan Active Problems:   ESRD on dialysis (HCC)   Chronic atrial fibrillation with rapid ventricular response (HCC)   GERD (gastroesophageal reflux disease)   Noncompliance with medication regimen   Hypertension   Type 2 diabetes mellitus (HCC)   Tobacco abuse   S/P CABG (coronary artery bypass graft)   CAD (coronary artery disease)   1. Atrial fibrillation with RVR - I think this is due to poor compliance as he had not gotten his medications filled from recent discharge. He responded fairly well to one dose of IV cardizem given in ED and therefore we didn't start an infusion for now.  His medications are being restarted now. He reportedly should be on diltiazem CD 240 mg, carvedilol 12.5 mg BID, apixaban 2.5 mg BID.  2. Hypertension - Monitor closely.  Holding parameters added to carvedilol.  3. Type 2 diabetes mellitus - check a1c, SSI coverage. Monitor CBGs.  4. Tobacco abuse - nicotine patch ordered.  Pt encouraged to stop all tobacco use.  5. Dyslipidemia - resume home atorvastatin 40 mg daily.  6. CAD s/p CABG - he remains on aspirin indefinitely. This has been resumed.  His imdur has been  resumed.  7. GERD - protonix ordered for GI protection.  8. ESRD on hemodialysis - nephrology consultation requested.   DVT prophylaxis: apixaban   Code Status: Full   Family Communication:   Disposition Plan: home   Consults called: nephrology  Admission status: INP   Collie Wernick MD [facility] How to contact the [facility] Attending or Consulting provider [place] or covering provider during after hours [place], for this patient?  1. Check the care team in [facility] and look for a) attending/consulting [facility] provider listed and b) the [facility] team listed 2. Log into [email or web address] and use [place]'s universal password to access. If you do not have the password, please contact the hospital operator. 3. Locate the [facility] provider you are looking  for under [facility] and page to a number that you can be directly reached. 4. If you still have difficulty reaching the provider, please page the [facility] (Director on Call) for the Hospitalists listed on amion for assistance.   If 7PM-7AM, please contact night-coverage [email or web address] Password [email or web address]  10/16/2020, 1:05 PM

## 2020-10-16 NOTE — ED Notes (Signed)
Patient believes he may have received during recent hospitalization but is not sure.

## 2020-10-16 NOTE — Consult Note (Signed)
[facility] Renal Consultation Note    Indication for Consultation:  Management of ESRD/hemodialysis; anemia, hypertension/volume and secondary hyperparathyroidism  HPI: Shaun Burns is a 49 y.o. male with a PMH significant for ESRD, on HD MWF at [facility], HTN, DM, HLD, CAD s/p CABG x 3, and atrial fibrillation who presented to his HD unit complaining of "not feeling well".  He was noted to be tachycardic and was sent to [facility] ED via EMS from dialysis.  In the ED he was noted to be in A fib with RVR in the 130's.  He complains of palpitations but no chest pain, shortness of breath, orthopnea, or PND.  We were consulted to provide HD during his hospitalization.  Past Medical History:  Diagnosis Date  . Chronic kidney disease   . Diabetes mellitus without complication ([place])   . GERD (gastroesophageal reflux disease)   . Headache   . Hypertension    Past Surgical History:  Procedure Laterality Date  . A/V FISTULAGRAM N/A 08/13/2018   Procedure: A/V FISTULAGRAM;  Surgeon: Marty Heck, MD;  Location: [place] [facility];  Service: Cardiovascular;  Laterality: N/A;  . A/V FISTULAGRAM N/A 01/25/2020   Procedure: A/V FISTULAGRAM - Left Arm;  Surgeon: Serafina Mitchell, MD;  Location: [place] [facility];  Service: Cardiovascular;  Laterality: N/A;  . AV FISTULA PLACEMENT Left 03/30/2015   Procedure: LEFT BRACHIOCEPHALIC ARTERIOVENOUS (AV) FISTULA CREATION;  Surgeon: Elam Dutch, MD;  Location: [place];  Service: Vascular;  Laterality: Left;  . KNEE SURGERY    . PERIPHERAL VASCULAR BALLOON ANGIOPLASTY Left 08/13/2018   Procedure: PERIPHERAL VASCULAR BALLOON ANGIOPLASTY;  Surgeon: Marty Heck, MD;  Location: [place] [facility];  Service: Cardiovascular;  Laterality: Left;  central venous   . PERIPHERAL VASCULAR BALLOON ANGIOPLASTY Left 01/25/2020   Procedure: PERIPHERAL VASCULAR BALLOON ANGIOPLASTY;  Surgeon: Serafina Mitchell, MD;  Location: [place] [facility];   Service: Cardiovascular;  Laterality: Left;  arm fistula  . PERIPHERAL VASCULAR CATHETERIZATION Left 01/11/2016   Procedure: A/V Shuntogram/Fistulagram;  Surgeon: Conrad Jenkins, MD;  Location: [place] [facility];  Service: Cardiovascular;  Laterality: Left;   Family History:   Family History  Problem Relation Age of Onset  . Hypertension Father   . Stroke Father   . Diabetes Mother    Social History:  reports that he has been smoking cigarettes. He has been smoking about 0.50 packs per day. He has never used smokeless tobacco. He reports that he does not drink alcohol and does not use drugs. No Known Allergies Prior to Admission medications   Medication Sig Start Date End Date Taking? Authorizing Provider  acetaminophen (TYLENOL) 500 MG tablet Take 1,500 mg by mouth 2 (two) times daily as needed for moderate pain.    Yes [provider]  albuterol (VENTOLIN HFA) 108 (90 Base) MCG/ACT inhaler Inhale 2 puffs into the lungs as needed.   Yes [provider]  amLODipine (NORVASC) 5 MG tablet Take 5 mg by mouth daily. 07/25/18  Yes [provider]  apixaban (ELIQUIS) 2.5 MG TABS tablet Take 2.5 mg by mouth daily. 07/24/20 07/24/21 Yes [provider]  aspirin EC 81 MG tablet Take 81 mg by mouth 2 (two) times daily.  07/08/18  Yes [provider]  atorvastatin (LIPITOR) 40 MG tablet Take 40 mg by mouth every evening.   Yes [provider]  carvedilol (COREG) 12.5 MG tablet Take 12.5 mg by mouth 2 (two) times  daily. 10/12/20  Yes [provider]  ciprofloxacin (CIPRO) 500 MG tablet Take 500 mg by mouth 2 (two) times daily. 01/11/20  Yes [provider]  diltiazem (CARDIZEM CD) 240 MG 24 hr capsule Take 240 mg by mouth daily. 10/12/20  Yes [provider]  doxycycline (VIBRAMYCIN) 100 MG capsule Take 100 mg by mouth in the morning and at bedtime. 10/04/20 10/18/20 Yes [provider]  isosorbide mononitrate (IMDUR) 60  MG 24 hr tablet Take 60 mg by mouth daily. 08/24/20  Yes [provider]  multivitamin (RENA-VIT) TABS tablet Take 1 tablet by mouth daily. 07/15/18  Yes [provider]  sevelamer carbonate (RENVELA) 800 MG tablet Take 800 mg by mouth 3 (three) times daily. 10/12/20  Yes [provider]  HYSEPT 0.25 % SOLN Apply 1 application topically 2 (two) times daily. Apply 1 L top use with wet to dry dressing Patient not taking: Reported on 10/16/2020 01/11/20   [provider]   Current Facility-Administered Medications  Medication Dose Route Frequency Provider Last Rate Last Admin  . carvedilol (COREG) tablet 6.25 mg  6.25 mg Oral BID WC Johnson, Clanford L, MD   6.25 mg at 10/16/20 1232   Current Outpatient Medications  Medication Sig Dispense Refill  . acetaminophen (TYLENOL) 500 MG tablet Take 1,500 mg by mouth 2 (two) times daily as needed for moderate pain.     Marland Kitchen albuterol (VENTOLIN HFA) 108 (90 Base) MCG/ACT inhaler Inhale 2 puffs into the lungs as needed.    Marland Kitchen amLODipine (NORVASC) 5 MG tablet Take 5 mg by mouth daily.    Marland Kitchen apixaban (ELIQUIS) 2.5 MG TABS tablet Take 2.5 mg by mouth daily.    Marland Kitchen aspirin EC 81 MG tablet Take 81 mg by mouth 2 (two) times daily.     Marland Kitchen atorvastatin (LIPITOR) 40 MG tablet Take 40 mg by mouth every evening.    . carvedilol (COREG) 12.5 MG tablet Take 12.5 mg by mouth 2 (two) times daily.    . ciprofloxacin (CIPRO) 500 MG tablet Take 500 mg by mouth 2 (two) times daily.    Marland Kitchen diltiazem (CARDIZEM CD) 240 MG 24 hr capsule Take 240 mg by mouth daily.    Marland Kitchen doxycycline (VIBRAMYCIN) 100 MG capsule Take 100 mg by mouth in the morning and at bedtime.    . isosorbide mononitrate (IMDUR) 60 MG 24 hr tablet Take 60 mg by mouth daily.    . multivitamin (RENA-VIT) TABS tablet Take 1 tablet by mouth daily.  6  . sevelamer carbonate (RENVELA) 800 MG tablet Take 800 mg by mouth 3 (three) times daily.    Marland Kitchen HYSEPT 0.25 % SOLN Apply 1 application topically  2 (two) times daily. Apply 1 L top use with wet to dry dressing (Patient not taking: Reported on 10/16/2020)     Labs: Basic Metabolic Panel: Recent Labs  Lab 10/16/20 1007  NA 138  K 4.8  CL 92*  CO2 27  GLUCOSE 113*  BUN 39*  CREATININE 12.72*  CALCIUM 8.6*   Liver Function Tests: No results for input(s): AST, ALT, ALKPHOS, BILITOT, PROT, ALBUMIN in the last 168 hours. No results for input(s): LIPASE, AMYLASE in the last 168 hours. No results for input(s): AMMONIA in the last 168 hours. CBC: No results for input(s): WBC, NEUTROABS, HGB, HCT, MCV, PLT in the last 168 hours. Cardiac Enzymes: No results for input(s): CKTOTAL, CKMB, CKMBINDEX, TROPONINI in the last 168 hours. CBG: No results for input(s): GLUCAP in  the last 168 hours. Iron Studies: No results for input(s): IRON, TIBC, TRANSFERRIN, FERRITIN in the last 72 hours. Studies/Results: DG Chest Port 1 View  Result Date: 10/16/2020 CLINICAL DATA:  Palpitations. Shortness of breath. Atrial fibrillation. EXAM: PORTABLE CHEST 1 VIEW COMPARISON:  None. FINDINGS: The heart size and mediastinal contours are within normal limits. Prior CABG again noted. Aortic atherosclerotic calcification noted. Both lungs are clear. The visualized skeletal structures are unremarkable. IMPRESSION: No active disease. Electronically Signed   By: Marlaine Hind M.D.   On: 10/16/2020 09:54    ROS: Pertinent items are noted in HPI. Physical Exam: Vitals:   10/16/20 1200 10/16/20 1230 10/16/20 1232 10/16/20 1236  BP: 131/90 134/79 134/79   Pulse:  (!) 137 (!) 107   Resp: 16 16    Temp:      TempSrc:      SpO2:    92%  Weight:      Height:          Weight change:  No intake or output data in the 24 hours ending 10/16/20 1244 BP 134/79   Pulse (!) 107   Temp 98.4 F (36.9 C) (Oral)   Resp 16   Ht 5\' 10"  (1.778 m)   Wt 93 kg   SpO2 92%   BMI 29.41 kg/m  General appearance: alert, cooperative and no distress Head: Normocephalic,  without obvious abnormality, atraumatic Eyes: negative findings: lids and lashes normal, conjunctivae and sclerae normal and corneas clear Resp: clear to auscultation bilaterally Cardio: irregularly irregular rhythm and no rub GI: soft, non-tender; bowel sounds normal; no masses,  no organomegaly Extremities: extremities normal, atraumatic, no cyanosis or edema and LUE AVF +T/B Dialysis Access:  Dialysis Orders: Center: [name]  on MWF . EDW 94 kg HD [place] 2K/2.5Ca  Time 4:15 Heparin none. Access LUE AVF BFR 450 DFR 600    Hectoral 2 mcg IV/HD Epogen none  Other Sensipar 30 mg po qHD  Assessment/Plan: 1.  Atrial Fibrillation with RVR- started on cardizem in ED.  He remains symptomatic with palpitations.  BP borderline.  Will need better rate control before we can proceed with HD. 2.  ESRD -  Will plan for HD tomorrow.  Will hold off on HD today given his RVR and low BP. 3.  Hypertension/volume  - BP variable and drops with tachycardia.   4.  Anemia  - no ESA 5.  Metabolic bone disease -   Cont with outpatient meds. 6.  Nutrition -  Renal diet, carb modified 7. DM - per primary.  Donetta Potts, MD [place] Pager [phone] 10/16/2020, 12:44 PM

## 2020-10-16 NOTE — Discharge Instructions (Addendum)
Please follow-up with your cardiologist at [facility] health regarding today's encounter for ongoing evaluation and management.

## 2020-10-16 NOTE — Plan of Care (Signed)
  Problem: Education: Goal: Knowledge of General Education information will improve Description: Including pain rating scale, medication(s)/side effects and non-pharmacologic comfort measures Outcome: Progressing   Problem: Health Behavior/Discharge Planning: Goal: Ability to manage health-related needs will improve 10/16/2020 2221 by Santa Lighter, RN Outcome: Progressing 10/16/2020 2216 by Santa Lighter, RN Outcome: Progressing   Problem: Clinical Measurements: Goal: Ability to maintain clinical measurements within normal limits will improve 10/16/2020 2221 by Santa Lighter, RN Outcome: Progressing 10/16/2020 2216 by Santa Lighter, RN Outcome: Progressing Goal: Will remain free from infection 10/16/2020 2221 by Santa Lighter, RN Outcome: Progressing 10/16/2020 2216 by Santa Lighter, RN Outcome: Progressing

## 2020-10-17 ENCOUNTER — Encounter (HOSPITAL_COMMUNITY): Payer: Self-pay | Admitting: Family Medicine

## 2020-10-17 ENCOUNTER — Inpatient Hospital Stay (HOSPITAL_COMMUNITY): Payer: Medicare Other

## 2020-10-17 DIAGNOSIS — I428 Other cardiomyopathies: Secondary | ICD-10-CM | POA: Diagnosis not present

## 2020-10-17 DIAGNOSIS — R778 Other specified abnormalities of plasma proteins: Secondary | ICD-10-CM

## 2020-10-17 DIAGNOSIS — I10 Essential (primary) hypertension: Secondary | ICD-10-CM | POA: Diagnosis not present

## 2020-10-17 DIAGNOSIS — N186 End stage renal disease: Secondary | ICD-10-CM

## 2020-10-17 DIAGNOSIS — I4891 Unspecified atrial fibrillation: Secondary | ICD-10-CM | POA: Diagnosis not present

## 2020-10-17 DIAGNOSIS — E785 Hyperlipidemia, unspecified: Secondary | ICD-10-CM | POA: Diagnosis not present

## 2020-10-17 LAB — ECHOCARDIOGRAM LIMITED
Calc EF: 45.2 %
Height: 70 in
S' Lateral: 3.55 cm
Single Plane A2C EF: 44.5 %
Single Plane A4C EF: 45.6 %
Weight: 3371.2 oz

## 2020-10-17 LAB — HEMOGLOBIN A1C
Hgb A1c MFr Bld: 6.3 % — ABNORMAL HIGH (ref 4.8–5.6)
Mean Plasma Glucose: 134 mg/dL

## 2020-10-17 LAB — COMPREHENSIVE METABOLIC PANEL
ALT: 16 U/L (ref 0–44)
AST: 12 U/L — ABNORMAL LOW (ref 15–41)
Albumin: 3.1 g/dL — ABNORMAL LOW (ref 3.5–5.0)
Alkaline Phosphatase: 83 U/L (ref 38–126)
Anion gap: 18 — ABNORMAL HIGH (ref 5–15)
BUN: 46 mg/dL — ABNORMAL HIGH (ref 6–20)
CO2: 24 mmol/L (ref 22–32)
Calcium: 8.1 mg/dL — ABNORMAL LOW (ref 8.9–10.3)
Chloride: 95 mmol/L — ABNORMAL LOW (ref 98–111)
Creatinine, Ser: 14.54 mg/dL — ABNORMAL HIGH (ref 0.61–1.24)
GFR, Estimated: 3 mL/min — ABNORMAL LOW (ref >60)
Glucose, Bld: 82 mg/dL (ref 70–99)
Potassium: 4.3 mmol/L (ref 3.5–5.1)
Sodium: 137 mmol/L (ref 135–145)
Total Bilirubin: 0.8 mg/dL (ref 0.3–1.2)
Total Protein: 6.9 g/dL (ref 6.5–8.1)

## 2020-10-17 LAB — CBC WITH DIFFERENTIAL/PLATELET
Abs Immature Granulocytes: 0.03 10*3/uL (ref 0.00–0.07)
Basophils Absolute: 0 10*3/uL (ref 0.0–0.1)
Basophils Relative: 1 %
Eosinophils Absolute: 0.1 10*3/uL (ref 0.0–0.5)
Eosinophils Relative: 2 %
HCT: 36.7 % — ABNORMAL LOW (ref 39.0–52.0)
Hemoglobin: 11.5 g/dL — ABNORMAL LOW (ref 13.0–17.0)
Immature Granulocytes: 0 %
Lymphocytes Relative: 21 %
Lymphs Abs: 1.6 10*3/uL (ref 0.7–4.0)
MCH: 27.5 pg (ref 26.0–34.0)
MCHC: 31.3 g/dL (ref 30.0–36.0)
MCV: 87.8 fL (ref 80.0–100.0)
Monocytes Absolute: 0.5 10*3/uL (ref 0.1–1.0)
Monocytes Relative: 7 %
Neutro Abs: 5.1 10*3/uL (ref 1.7–7.7)
Neutrophils Relative %: 69 %
Platelets: 280 10*3/uL (ref 150–400)
RBC: 4.18 MIL/uL — ABNORMAL LOW (ref 4.22–5.81)
RDW: 15.7 % — ABNORMAL HIGH (ref 11.5–15.5)
WBC: 7.4 10*3/uL (ref 4.0–10.5)
nRBC: 0 % (ref 0.0–0.2)

## 2020-10-17 LAB — GLUCOSE, CAPILLARY
Glucose-Capillary: 104 mg/dL — ABNORMAL HIGH (ref 70–99)
Glucose-Capillary: 113 mg/dL — ABNORMAL HIGH (ref 70–99)
Glucose-Capillary: 84 mg/dL (ref 70–99)
Glucose-Capillary: 89 mg/dL (ref 70–99)
Glucose-Capillary: 96 mg/dL (ref 70–99)

## 2020-10-17 LAB — TROPONIN I (HIGH SENSITIVITY): Troponin I (High Sensitivity): 519 ng/L (ref <18)

## 2020-10-17 LAB — MAGNESIUM: Magnesium: 1.9 mg/dL (ref 1.7–2.4)

## 2020-10-17 LAB — PHOSPHORUS: Phosphorus: 8.8 mg/dL — ABNORMAL HIGH (ref 2.5–4.6)

## 2020-10-17 MED ORDER — DILTIAZEM HCL 30 MG PO TABS
30.0000 mg | ORAL_TABLET | Freq: Four times a day (QID) | ORAL | Status: DC
  Administered 2020-10-18: 30 mg via ORAL
  Filled 2020-10-17: qty 1

## 2020-10-17 MED ORDER — DILTIAZEM HCL 30 MG PO TABS
30.0000 mg | ORAL_TABLET | Freq: Four times a day (QID) | ORAL | Status: DC
  Administered 2020-10-17 (×2): 30 mg via ORAL
  Filled 2020-10-17 (×3): qty 1

## 2020-10-17 MED ORDER — ASPIRIN EC 81 MG PO TBEC
81.0000 mg | DELAYED_RELEASE_TABLET | Freq: Every day | ORAL | Status: DC
  Administered 2020-10-18 – 2020-10-19 (×2): 81 mg via ORAL
  Filled 2020-10-17 (×2): qty 1

## 2020-10-17 NOTE — Progress Notes (Signed)
*  PRELIMINARY RESULTS* Echocardiogram Limited 2-D Echocardiogram has been performed.  Samuel Germany 10/17/2020, 2:53 PM

## 2020-10-17 NOTE — Consult Note (Addendum)
Cardiology Consult    Patient ID: Shaun Burns; [id number]; [birth year]   Admit date: 10/16/2020 Date of Consult: 10/17/2020  Primary Care Provider: Briant Sites, PA-C Primary Cardiologist: Maia Plan, [place]  Patient Profile   Shaun Burns is a 49 y.o. male with past medical history of CAD (s/p CABG in 2019 with LIMA-LAD-D1 and SVG-PDA, abnormal NST in 05/2020 with cath showing high grade mLAD, D1 prox and after bifurcation, D2 ostial, mLCx obstructive, LIMA-D2-LAD patent, SVG-RCA occluded, and CTO RCA with L-->R collaterals and medical management was recommended), HTN, HLD, ESRD and paroxysmal atrial fibrillation (diagnosed in 06/2020 by review of Care Everywhere) who is being seen today for the evaluation of atrial fibrillation with RVR at the request of Dr. Wynetta Emery.   History of Present Illness    Shaun Burns presented to [facility] ED on 10/16/2020 from HD due to being in atrial fibrillation with RVR. Says he had been admitted at [facility] but by review of Care Everywhere he had an Emergency Department visit for Epididymitis on 10/6 but unable to locate any admission notes. Notes mention HR was in the 130's upon arrival and he was unsure which medications he was taking at the time.   He went to HD on the day of admission but was advised to go to the ED due to elevated HR. He reports episodes of palpitations for the past few months but says they do not necessarily bother him. Does not check his HR at home but had been well-controlled at HD until yesterday. Does hold his Coreg the morning of HD due to hypotension during his sessions. Says he was at [facility] last week due to abdominal pain and elevated HR with new medications being prescribed but he did not pick these up from the pharmacy. Listed as being on Cardizem CD but says he has never taken this. He does have baseline dyspnea on exertion but denies any recent chest pain. No orthopnea, PND or  edema.   Initial labs show WBC 9.4, Hgb 13.2, platelets 307, Na+ 136, K+ 4.5, and creatinine 13.0. Mg 1.9. BNP 1898. Initial HS Troponin 520 with no repeat values obtained. CXR with no active disease. EKG showed atrial fibrillation with RVR, HR 129 with ST depression along the lateral leads which is similar to prior tracings.   He was listed as being on Eliquis 2.5mg  BID, Amlodipine 5mg  daily, ASA 81mg , Atorvastatin 40mg  daily, Coreg 12.5mg  BID, Cardizem CD 240mg  daily and Imdur 60mg  daily. He received IV Cardizem 10mg  while in the ED and was restarted on his PO Coreg and Cardizem CD. Amlodipine was discontinued due to softer BP and dual CCB therapy.    Past Medical History:  Diagnosis Date  . CAD (coronary artery disease)    a. s/p CABG in 2019 with LIMA-LAD-D1 and SVG-PDA b. abnormal NST in 05/2020 with cath showing high grade mLAD, D1 prox and after bifurcation, D2 ostial, mLCx obstructive, LIMA-D2-LAD patent, SVG-RCA occluded, and CTO RCA with L-->R collaterals and medical management was recommended  . Chronic kidney disease   . Diabetes mellitus without complication ([place])   . GERD (gastroesophageal reflux disease)   . Headache   . Hypertension     Past Surgical History:  Procedure Laterality Date  . A/V FISTULAGRAM N/A 08/13/2018   Procedure: A/V FISTULAGRAM;  Surgeon: Marty Heck, MD;  Location: [place] [facility];  Service: Cardiovascular;  Laterality: N/A;  . A/V FISTULAGRAM N/A 01/25/2020  Procedure: A/V FISTULAGRAM - Left Arm;  Surgeon: Serafina Mitchell, MD;  Location: [place] [facility];  Service: Cardiovascular;  Laterality: N/A;  . AV FISTULA PLACEMENT Left 03/30/2015   Procedure: LEFT BRACHIOCEPHALIC ARTERIOVENOUS (AV) FISTULA CREATION;  Surgeon: Elam Dutch, MD;  Location: [place];  Service: Vascular;  Laterality: Left;  . KNEE SURGERY    . PERIPHERAL VASCULAR BALLOON ANGIOPLASTY Left 08/13/2018   Procedure: PERIPHERAL VASCULAR BALLOON ANGIOPLASTY;  Surgeon: Marty Heck, MD;  Location: [place] [facility];  Service: Cardiovascular;  Laterality: Left;  central venous   . PERIPHERAL VASCULAR BALLOON ANGIOPLASTY Left 01/25/2020   Procedure: PERIPHERAL VASCULAR BALLOON ANGIOPLASTY;  Surgeon: Serafina Mitchell, MD;  Location: [place] [facility];  Service: Cardiovascular;  Laterality: Left;  arm fistula  . PERIPHERAL VASCULAR CATHETERIZATION Left 01/11/2016   Procedure: A/V Shuntogram/Fistulagram;  Surgeon: Conrad Vel[place], MD;  Location: [place] [facility];  Service: Cardiovascular;  Laterality: Left;     Home Medications:  Prior to Admission medications   Medication Sig Start Date End Date Taking? Authorizing Provider  acetaminophen (TYLENOL) 500 MG tablet Take 1,500 mg by mouth 2 (two) times daily as needed for moderate pain.    Yes [provider]  albuterol (VENTOLIN HFA) 108 (90 Base) MCG/ACT inhaler Inhale 2 puffs into the lungs as needed.   Yes [provider]  amLODipine (NORVASC) 5 MG tablet Take 5 mg by mouth daily. 07/25/18  Yes [provider]  apixaban (ELIQUIS) 2.5 MG TABS tablet Take 2.5 mg by mouth daily. 07/24/20 07/24/21 Yes [provider]  aspirin EC 81 MG tablet Take 81 mg by mouth 2 (two) times daily.  07/08/18  Yes [provider]  atorvastatin (LIPITOR) 40 MG tablet Take 40 mg by mouth every evening.   Yes [provider]  carvedilol (COREG) 12.5 MG tablet Take 12.5 mg by mouth 2 (two) times daily. 10/12/20  Yes [provider]  ciprofloxacin (CIPRO) 500 MG tablet Take 500 mg by mouth 2 (two) times daily. 01/11/20  Yes [provider]  diltiazem (CARDIZEM CD) 240 MG 24 hr capsule Take 240 mg by mouth daily. 10/12/20  Yes [provider]  doxycycline (VIBRAMYCIN) 100 MG capsule Take 100 mg by mouth in the morning and at bedtime. 10/04/20 10/18/20 Yes [provider]  isosorbide mononitrate (IMDUR) 60 MG 24 hr tablet Take 60 mg by mouth daily. 08/24/20  Yes  [provider]  multivitamin (RENA-VIT) TABS tablet Take 1 tablet by mouth daily. 07/15/18  Yes [provider]  sevelamer carbonate (RENVELA) 800 MG tablet Take 800 mg by mouth 3 (three) times daily. 10/12/20  Yes [provider]    Inpatient Medications: Scheduled Meds: . apixaban  5 mg Oral BID  . [START ON 10/18/2020] aspirin EC  81 mg Oral Daily  . atorvastatin  40 mg Oral QPM  . carvedilol  12.5 mg Oral BID  . Chlorhexidine Gluconate Cloth  6 each Topical Q0600  . diltiazem  30 mg Oral Q6H  . insulin aspart  0-6 Units Subcutaneous TID WC  . isosorbide mononitrate  60 mg Oral Daily  . multivitamin  1 tablet Oral Daily  . sevelamer carbonate  800 mg Oral TID with meals   Continuous Infusions: . sodium chloride    . sodium chloride     PRN Meds: sodium chloride, sodium chloride, acetaminophen **OR** acetaminophen, albuterol, alteplase, bisacodyl, heparin, lidocaine (PF), lidocaine-prilocaine, nicotine, ondansetron **OR** ondansetron (ZOFRAN) IV,  pentafluoroprop-tetrafluoroeth  Allergies:   No Known Allergies  Social History:   Social History   Socioeconomic History  . Marital status: Married    Spouse name: Not on file  . Number of children: Not on file  . Years of education: Not on file  . Highest education level: Not on file  Occupational History  . Not on file  Tobacco Use  . Smoking status: Current Every Day Smoker    Packs/day: 0.50    Types: Cigarettes  . Smokeless tobacco: Never Used  Substance and Sexual Activity  . Alcohol use: No    Alcohol/week: 0.0 standard drinks  . Drug use: No  . Sexual activity: Not on file  Other Topics Concern  . Not on file  Social History Narrative  . Not on file   Social Determinants of Health   Financial Resource Strain:   . Difficulty of Paying Living Expenses: Not on file  Food Insecurity:   . Worried About Charity fundraiser in the Last Year: Not on file  . Ran Out of Food in the Last  Year: Not on file  Transportation Needs:   . Lack of Transportation (Medical): Not on file  . Lack of Transportation (Non-Medical): Not on file  Physical Activity:   . Days of Exercise per Week: Not on file  . Minutes of Exercise per Session: Not on file  Stress:   . Feeling of Stress : Not on file  Social Connections:   . Frequency of Communication with Friends and Family: Not on file  . Frequency of Social Gatherings with Friends and Family: Not on file  . Attends Religious Services: Not on file  . Active Member of Clubs or Organizations: Not on file  . Attends Archivist Meetings: Not on file  . Marital Status: Not on file  Intimate Partner Violence:   . Fear of Current or Ex-Partner: Not on file  . Emotionally Abused: Not on file  . Physically Abused: Not on file  . Sexually Abused: Not on file     Family History:    Family History  Problem Relation Age of Onset  . Hypertension Father   . Stroke Father   . Diabetes Mother       Review of Systems    General:  No chills, fever, night sweats or weight changes.  Cardiovascular:  No chest pain, edema, orthopnea, paroxysmal nocturnal dyspnea. Positive for palpitations and dyspnea on exertion.  Dermatological: No rash, lesions/masses Respiratory: No cough, dyspnea Urologic: No hematuria, dysuria Abdominal:   No nausea, vomiting, diarrhea, bright red blood per rectum, melena, or hematemesis Neurologic:  No visual changes, wkns, changes in mental status. All other systems reviewed and are otherwise negative except as noted above.  Physical Exam/Data    Vitals:   10/16/20 1938 10/16/20 2214 10/16/20 2216 10/17/20 0445  BP:   (!) 128/92 125/85  Pulse: (!) 102 (!) 115 (!) 104 99  Resp: 18  20 18   Temp:   98.6 F (37 C) 98.6 F (37 C)  TempSrc:      SpO2:   100% 96%  Weight:    95.6 kg  Height:       No intake or output data in the 24 hours ending 10/17/20 1022 Filed Weights   10/16/20 0902 10/16/20 1900  10/17/20 0445  Weight: 93 kg 94.4 kg 95.6 kg   Body mass index is 30.23 kg/m.   General: Pleasant, male appearing in NAD Psych: Normal  affect. Neuro: Alert and oriented X 3. Moves all extremities spontaneously. HEENT: Normal  Neck: Supple without bruits or JVD. Lungs:  Resp regular and unlabored, without wheezing or rales. Heart: Irregularly irregular. No s3, s4, or murmurs. Abdomen: Soft, non-tender, non-distended, BS + x 4.  Extremities: No clubbing, cyanosis or edema. DP/PT/Radials 2+ and equal bilaterally. Fistula along LUE.    EKG:  The EKG was personally reviewed and demonstrates: Atrial fibrillation with RVR, HR 129 with ST depression along the lateral leads which is similar to prior tracings.   Labs/Studies     Relevant CV Studies:  Echocardiogram: 04/2020 SUMMARY  The left ventricular size is normal.  There is moderate concentric left ventricular hypertrophy.  LV ejection fraction = 55-60%.  Global LV systolic function is preserved  There are regional wall motion abnormalities as specified below.  There is basal LV septal wall mild hypokinesis  There is basal LV inferior wall mild hypokinesis  The right ventricular systolic function is normal.  The left atrium is mildly dilated.  There is mild to moderate mitral regurgitation.  There is mild tricuspid regurgitation.  The aortic sinus is normal size.  IVC size was mildly dilated.  There is no pericardial effusion.  Difficulty to compare with the priorstudy due to different image  quality.     Cardiac Catheterization: 05/2020  Coronary and graft angigoram via R CFA for abnormal stress test (has LIMA  as conduit and LUE AVF). LM engaged, free of significant disease. LAD  has high grade lesion mid segment, D1 has high grade disease proximally  and bifurcates with disease involving both branches. D2 has high grade  disease ostially. LCx has obstructive disease mid but supplies small LPL  [place] that is  diseased as well as AV groove with limited territory.   There are L-R collaterals. RCA engaged, obstructive disease prox and mid  and occluded distally. SVG-RCA is occluded. LIMA-D2-LAD is patent.   Given stress test findings with CTO RCA and limited territory of Cx,  decided to stop at diagnostic. D1 is diseased but does not correlate with  stress test findings and would require treating bifurcation. Because of  this and significant progression of CAD in the last 2 years, would  recommend aggressive medical therapy at this time. AV not crossed. EBL <  5 cc, manual pressure for hemostasis, no specimens.   Laboratory Data:  Chemistry Recent Labs  Lab 10/16/20 1007 10/16/20 1300 10/17/20 0709  NA 138 136 137  K 4.8 4.5 4.3  CL 92* 94* 95*  CO2 27 25 24   GLUCOSE 113* 93 82  BUN 39* 40* 46*  CREATININE 12.72* 13.00* 14.54*  CALCIUM 8.6* 8.4* 8.1*  GFRNONAA 4* 4* 3*  ANIONGAP 19* 17* 18*    Recent Labs  Lab 10/16/20 1300 10/17/20 0709  PROT  --  6.9  ALBUMIN 3.8 3.1*  AST  --  12*  ALT  --  16  ALKPHOS  --  83  BILITOT  --  0.8   Hematology Recent Labs  Lab 10/16/20 1331 10/17/20 0709  WBC 9.4 7.4  RBC 4.73 4.18*  HGB 13.2 11.5*  HCT 41.3 36.7*  MCV 87.3 87.8  MCH 27.9 27.5  MCHC 32.0 31.3  RDW 15.7* 15.7*  PLT 307 280   Cardiac EnzymesNo results for input(s): TROPONINI in the last 168 hours. No results for input(s): TROPIPOC in the last 168 hours.  BNP Recent Labs  Lab 10/16/20 1007  BNP 1,898.0*  DDimer No results for input(s): DDIMER in the last 168 hours.  Radiology/Studies:  DG Chest Port 1 View  Result Date: 10/16/2020 CLINICAL DATA:  Palpitations. Shortness of breath. Atrial fibrillation. EXAM: PORTABLE CHEST 1 VIEW COMPARISON:  None. FINDINGS: The heart size and mediastinal contours are within normal limits. Prior CABG again noted. Aortic atherosclerotic calcification noted. Both lungs are clear. The visualized skeletal structures are  unremarkable. IMPRESSION: No active disease. Electronically Signed   By: Marlaine Hind M.D.   On: 10/16/2020 09:54     Assessment & Plan    1. Atrial Fibrillation with RVR - Was initially diagnosed in 06/2020 by review of Care Everywhere and rate-control was pursued at that time with plans to address rhythm control at follow-up. Per his report, his HR was elevated at the time of [facility] ED evaluation and during recent admission at [facility] in [place].  - Listed as being on Coreg 12.5mg  BID and Cardizem CD 240mg  daily as an outpatient but has not been on Cardizem. Given he will be going for HD today, will continue Coreg but change Cardizem to short-acting dosing of 30 mg Q6H so we can make sure his BP will allow for this. Can also consider changing Coreg to Lopressor if BP becomes an issue. Not an ideal candidate for Amiodarone given his young age and would not pursue DCCV at this time given concerns for compliance with anticoagulation as an outpatient. Will order a limited echo for reassessment of his EF as this would guide medication decisions as well.  - This patients CHA2DS2-VASc Score and unadjusted Ischemic Stroke Rate (% per year) is equal to 2.2 % stroke rate/year from a score of 2 (HTN, Vascular). He has been continued on Eliquis 5mg  BID.   2. CAD/Elevated Troponin - He is s/p CABG in 2019 with LIMA-LAD-D1 and SVG-PDA. He did have an abnormal NST in 05/2020 with cath showing high grade mLAD, D1 prox and after bifurcation, D2 ostial, mLCx obstructive, LIMA-D2-LAD patent, SVG-RCA occluded, and CTO RCA with L-->R collaterals and medical management was recommended. - Initial HS Troponin elevated to 520 with no repeat values checked. Will order repeat for this AM. Suspect secondary to demand ischemia in the setting of atrial fibrillation with RVR. Will order repeat limited echo for reassessment of EF.  - Will reduce ASA from 81mg  BID to 81mg  daily given concurrent use of anticoagulation.  Remains on BB and statin therapy.  3. HTN - BP has been variable from 109/69 - 142/93 since admission. Amlodipine discontinued to allow for further titration of AV nodal blocking agents.   4. HLD - He has been continued on PTA Atorvastatin 40mg  daily.   5. ESRD - On HD - MWF schedule. Scheduled for HD today due to missing his session yesterday. Nephrology following.    For questions or updates, please contact Ruth Please consult [email or web address] for contact info under Cardiology/STEMI.  Signed, Erma Heritage, PA-C 10/17/2020, 10:22 AM Pager: [phone]   Patient seen and disucssed with PA Ahmed Prima, I agree with her documentation. 48 yo male history of CAD with CABG in 2019, ESRD, DM2, HTN, poor compliance admitted from dialysis with afib with RVR. Has not been taking his av nodal agents at home.    K 4.8 Cr 12 BUN 39 Mg 2 BNP 1898  hstrop 520--> COVID neg CXR no acute process EKG afib, RVR Echo pending   Admitted with afib with RVR in setting of medication noncompliance. Starting oral dilt 30mg  every 6  hours and follow rates, can consolidated to long acting closer to discharge. Also on coreg 12.5mg  bid. Continue eliquis for stroke prevention.   Elevated trop in setting of ESRD and afib with RVR nonspecific finding, presentation not consistnet with ACS. Follow trend   Carlyle Dolly MD

## 2020-10-17 NOTE — Progress Notes (Signed)
Patient ID: Shaun Burns, male   DOB: 04-20-1971, 49 y.o.   MRN: [id number] S: Feels better today, no palpitations O:BP 125/85   Pulse 99   Temp 98.6 F (37 C)   Resp 18   Ht 5' 10"  (1.778 m)   Wt 95.6 kg   SpO2 96%   BMI 30.23 kg/m  No intake or output data in the 24 hours ending 10/17/20 0907 Intake/Output: No intake/output data recorded.  Intake/Output this shift:  No intake/output data recorded. Weight change:  Gen: NAD CVS: IRR IRR Resp: cta Abd: +BS, soft, Nt/Nd Ext: no edema, LUE AVF +T/B  Recent Labs  Lab 10/16/20 1007 10/16/20 1300 10/17/20 0709  NA 138 136 137  K 4.8 4.5 4.3  CL 92* 94* 95*  CO2 27 25 24   GLUCOSE 113* 93 82  BUN 39* 40* 46*  CREATININE 12.72* 13.00* 14.54*  ALBUMIN  --  3.8 3.1*  CALCIUM 8.6* 8.4* 8.1*  PHOS  --  8.4* 8.8*  AST  --   --  12*  ALT  --   --  16   Liver Function Tests: Recent Labs  Lab 10/16/20 1300 10/17/20 0709  AST  --  12*  ALT  --  16  ALKPHOS  --  83  BILITOT  --  0.8  PROT  --  6.9  ALBUMIN 3.8 3.1*   No results for input(s): LIPASE, AMYLASE in the last 168 hours. No results for input(s): AMMONIA in the last 168 hours. CBC: Recent Labs  Lab 10/16/20 1331 10/17/20 0709  WBC 9.4 7.4  NEUTROABS  --  5.1  HGB 13.2 11.5*  HCT 41.3 36.7*  MCV 87.3 87.8  PLT 307 280   Cardiac Enzymes: No results for input(s): CKTOTAL, CKMB, CKMBINDEX, TROPONINI in the last 168 hours. CBG: Recent Labs  Lab 10/16/20 1639 10/16/20 2218 10/17/20 0448 10/17/20 0809  GLUCAP 243* 98 89 84    Iron Studies: No results for input(s): IRON, TIBC, TRANSFERRIN, FERRITIN in the last 72 hours. Studies/Results: DG Chest Port 1 View  Result Date: 10/16/2020 CLINICAL DATA:  Palpitations. Shortness of breath. Atrial fibrillation. EXAM: PORTABLE CHEST 1 VIEW COMPARISON:  None. FINDINGS: The heart size and mediastinal contours are within normal limits. Prior CABG again noted. Aortic atherosclerotic calcification noted. Both lungs  are clear. The visualized skeletal structures are unremarkable. IMPRESSION: No active disease. Electronically Signed   By: Marlaine Hind M.D.   On: 10/16/2020 09:54   . apixaban  5 mg Oral BID  . aspirin EC  81 mg Oral BID  . atorvastatin  40 mg Oral QPM  . carvedilol  12.5 mg Oral BID  . Chlorhexidine Gluconate Cloth  6 each Topical Q0600  . diltiazem  240 mg Oral Daily  . insulin aspart  0-6 Units Subcutaneous TID WC  . isosorbide mononitrate  60 mg Oral Daily  . multivitamin  1 tablet Oral Daily  . sevelamer carbonate  800 mg Oral TID with meals    BMET    Component Value Date/Time   NA 137 10/17/2020 0709   K 4.3 10/17/2020 0709   CL 95 (L) 10/17/2020 0709   CO2 24 10/17/2020 0709   GLUCOSE 82 10/17/2020 0709   BUN 46 (H) 10/17/2020 0709   CREATININE 14.54 (H) 10/17/2020 0709   CALCIUM 8.1 (L) 10/17/2020 0709   GFRNONAA 3 (L) 10/17/2020 0709   GFRAA  03/26/2010 1945    >60        The  eGFR has been calculated using the MDRD equation. This calculation has not been validated in all clinical situations. eGFR's persistently <60 mL/min signify possible Chronic Kidney Disease.   CBC    Component Value Date/Time   WBC 7.4 10/17/2020 0709   RBC 4.18 (L) 10/17/2020 0709   HGB 11.5 (L) 10/17/2020 0709   HCT 36.7 (L) 10/17/2020 0709   PLT 280 10/17/2020 0709   MCV 87.8 10/17/2020 0709   MCH 27.5 10/17/2020 0709   MCHC 31.3 10/17/2020 0709   RDW 15.7 (H) 10/17/2020 0709   LYMPHSABS 1.6 10/17/2020 0709   MONOABS 0.5 10/17/2020 0709   EOSABS 0.1 10/17/2020 0709   BASOSABS 0.0 10/17/2020 0709    Dialysis Orders: [facility]: [facility]  on MWF . EDW 94 kg HD Bath 2K/2.5Ca  Time 4:15 Heparin none. Access LUE AVF BFR 450 DFR 600    Hectoral 2 mcg IV/HD Epogen none  Other Sensipar 30 mg po qHD  Assessment/Plan: 1.  Atrial Fibrillation with RVR- started on cardizem in ED.  He remains symptomatic with palpitations.  BP improved with rate control.  2.  ESRD -  Will plan for HD  today and get back on MWF schedule later this week.  3.  Hypertension/volume  - BP variable and drops with tachycardia.  currently stable with addition of diltiazem. 4.  Anemia  - no ESA 5.  Metabolic bone disease -   Cont with outpatient meds. 6.  Nutrition -  Renal diet, carb modified 7. DM - per primary 8. Disposition- per primary.  Donetta Potts, MD [employer] [phone]

## 2020-10-17 NOTE — Progress Notes (Signed)
PROGRESS NOTE   Shaun Burns  JME:[id number] DOB: [birth year] DOA: 10/16/2020 PCP: Briant Sites, PA-C   Chief Complaint  Patient presents with  . Irregular Heart Beat    Recently diagnosed with A-fib with inpatient stay discharged Thursday. Unable to start PO Cardizem at home. To dialysis this am and they refused to treat him due to "unstable A-fib." EMS reports rate100-120. Last dialysis treatment Friday. Reports chronic SHOB and states that he can feel rapid heartbeat.     Brief Admission History:  48 y.o. male with medical history significant for coronary artery disease status post CABG in 2019, end-stage renal disease on hemodialysis Tuesday Thursday Saturday with an AV fistula of left upper extremity, type 2 diabetes mellitus, chronic tobacco use, hypertension and poor compliance with taking medications was recently discharged from [facility] was seen today for regular scheduled hemodialysis and noted to have atrial fibrillation with RVR and his dialysis was canceled and he was sent to the emergency department for further evaluation.  The patient reports that he has new cardiac medications that were prescribed during his last hospitalization but he has not been able to afford to get the medications due to being on a fixed income.  He reports that he has been having palpitations.  He denies having shortness of breath.  He denies fever and chills.Marland Kitchen  He reports that he took some of his medications this morning but is not 100% sure of all of the medications that he took.  He believes he took aspirin Coreg and atorvastatin.  He was in atrial fibrillation with RVR and soft blood pressures on arrival with a heart rate in the 130s.  He was given 10 mg of cardizem IV in ED and his heart rate improved to 100-115.  His labs were noted to be stable.  Admission was requested for further management of his atrial fibrillation with RVR.   Assessment & Plan:   Active Problems:   ESRD  on dialysis [facility])   Chronic atrial fibrillation with rapid ventricular response (HCC)   GERD (gastroesophageal reflux disease)   Noncompliance with medication regimen   Hypertension   Type 2 diabetes mellitus (HCC)   Tobacco abuse   S/P CABG (coronary artery bypass graft)   CAD (coronary artery disease)  1. Atrial fibrillation with RVR - Heart rate better controlled since being restarted on his heart medications. He remains on diltiazem 30 mg every 6 hours for now, carvedilol 12.5 mg BID, apixaban 5 mg BID.  2. Hypertension - Monitor closely.  Holding parameters added to carvedilol.  3. Type 2 diabetes mellitus - a1c 6.3%, Monitor CBGs.  4. Tobacco abuse - nicotine patch ordered.  Pt encouraged to stop all tobacco use.  5. Dyslipidemia - resume home atorvastatin 40 mg daily.  6. CAD s/p CABG - he remains on aspirin indefinitely. This has been resumed.  His imdur has been resumed.  7. GERD - protonix ordered for GI protection.  8. ESRD on hemodialysis - nephrology consultation planning for HD later today.    DVT prophylaxis: apixaban   Code Status: Full   Family Communication:   Disposition Plan: home   Consults called: nephrology  Admission status: INP  Status is: Inpatient  Remains inpatient appropriate because:IV treatments appropriate due to intensity of illness or inability to take PO and Inpatient level of care appropriate due to severity of illness  Dispo:  Patient From: Home  Planned Disposition: Home  Expected discharge date: 10/18/20  Medically stable for  discharge: No  Consultants:   Cardiology  Nephrology   Procedures:   Hemodialysis   Antimicrobials:    Subjective: Pt feels occasional palpitations, no chest pain, no SOB  Objective: Vitals:   10/16/20 1938 10/16/20 2214 10/16/20 2216 10/17/20 0445  BP:   (!) 128/92 125/85  Pulse: (!) 102 (!) 115 (!) 104 99  Resp: 18  20 18   Temp:   98.6 F (37 C) 98.6 F (37 C)  TempSrc:      SpO2:   100% 96%    Weight:    95.6 kg  Height:       No intake or output data in the 24 hours ending 10/17/20 1155 Filed Weights   10/16/20 0902 10/16/20 1900 10/17/20 0445  Weight: 93 kg 94.4 kg 95.6 kg   Examination:  General exam: Appears calm and comfortable  Respiratory system: Clear to auscultation. Respiratory effort normal. Cardiovascular system: S1 & S2 heard, RRR. No JVD, murmurs, rubs, gallops or clicks. No pedal edema. Gastrointestinal system: Abdomen is nondistended, soft and nontender. No organomegaly or masses felt. Normal bowel sounds heard. Central nervous system: Alert and oriented. No focal neurological deficits. Extremities: Symmetric 5 x 5 power. Skin: No rashes, lesions or ulcers Psychiatry: Judgement and insight appear normal. Mood & affect appropriate.   Data Reviewed: I have personally reviewed following labs and imaging studies  CBC: Recent Labs  Lab 10/16/20 1331 10/17/20 0709  WBC 9.4 7.4  NEUTROABS  --  5.1  HGB 13.2 11.5*  HCT 41.3 36.7*  MCV 87.3 87.8  PLT 307 937    Basic Metabolic Panel: Recent Labs  Lab 10/16/20 1007 10/16/20 1300 10/17/20 0709  NA 138 136 137  K 4.8 4.5 4.3  CL 92* 94* 95*  CO2 27 25 24   GLUCOSE 113* 93 82  BUN 39* 40* 46*  CREATININE 12.72* 13.00* 14.54*  CALCIUM 8.6* 8.4* 8.1*  MG 2.0  --  1.9  PHOS  --  8.4* 8.8*    GFR: Estimated Creatinine Clearance: 7.2 mL/min (A) (by C-G formula based on SCr of 14.54 mg/dL (H)).  Liver Function Tests: Recent Labs  Lab 10/16/20 1300 10/17/20 0709  AST  --  12*  ALT  --  16  ALKPHOS  --  83  BILITOT  --  0.8  PROT  --  6.9  ALBUMIN 3.8 3.1*    CBG: Recent Labs  Lab 10/16/20 1639 10/16/20 2218 10/17/20 0448 10/17/20 0809  GLUCAP 243* 98 89 84    Recent Results (from the past 240 hour(s))  Resp Panel by RT PCR (RSV, Flu A&B, Covid) - Nasopharyngeal Swab     Status: None   Collection Time: 10/16/20 10:16 AM   Specimen: Nasopharyngeal Swab  Result Value Ref Range  Status   SARS Coronavirus 2 by RT PCR NEGATIVE NEGATIVE Final    Comment: (NOTE) SARS-CoV-2 target nucleic acids are NOT DETECTED.  The SARS-CoV-2 RNA is generally detectable in upper respiratoy specimens during the acute phase of infection. The lowest concentration of SARS-CoV-2 viral copies this assay can detect is 131 copies/mL. A negative result does not preclude SARS-Cov-2 infection and should not be used as the sole basis for treatment or other patient management decisions. A negative result may occur with  improper specimen collection/handling, submission of specimen other than nasopharyngeal swab, presence of viral mutation(s) within the areas targeted by this assay, and inadequate number of viral copies (<131 copies/mL). A negative result must be combined with clinical observations,  patient history, and epidemiological information. The expected result is Negative.  Fact Sheet for Patients:  PinkCheek.be  Fact Sheet for Healthcare Providers:  GravelBags.it  This test is no t yet approved or cleared by the [place] FDA and  has been authorized for detection and/or diagnosis of SARS-CoV-2 by FDA under an Emergency Use Authorization (EUA). This EUA will remain  in effect (meaning this test can be used) for the duration of the COVID-19 declaration under Section 564(b)(1) of the Act, 21 U.S.C. section 360bbb-3(b)(1), unless the authorization is terminated or revoked sooner.     Influenza A by PCR NEGATIVE NEGATIVE Final   Influenza B by PCR NEGATIVE NEGATIVE Final    Comment: (NOTE) The Xpert Xpress SARS-CoV-2/FLU/RSV assay is intended as an aid in  the diagnosis of influenza from Nasopharyngeal swab specimens and  should not be used as a sole basis for treatment. Nasal washings and  aspirates are unacceptable for Xpert Xpress SARS-CoV-2/FLU/RSV  testing.  Fact Sheet for  Patients: PinkCheek.be  Fact Sheet for Healthcare Providers: GravelBags.it  This test is not yet approved or cleared by the [place] FDA and  has been authorized for detection and/or diagnosis of SARS-CoV-2 by  FDA under an Emergency Use Authorization (EUA). This EUA will remain  in effect (meaning this test can be used) for the duration of the  Covid-19 declaration under Section 564(b)(1) of the Act, 21  U.S.C. section 360bbb-3(b)(1), unless the authorization is  terminated or revoked.    Respiratory Syncytial Virus by PCR NEGATIVE NEGATIVE Final    Comment: (NOTE) Fact Sheet for Patients: PinkCheek.be  Fact Sheet for Healthcare Providers: GravelBags.it  This test is not yet approved or cleared by the [place] FDA and  has been authorized for detection and/or diagnosis of SARS-CoV-2 by  FDA under an Emergency Use Authorization (EUA). This EUA will remain  in effect (meaning this test can be used) for the duration of the  COVID-19 declaration under Section 564(b)(1) of the Act, 21 U.S.C.  section 360bbb-3(b)(1), unless the authorization is terminated or  revoked. Performed at [facility], [address], [place], [place] [address]      Radiology Studies: [facility] 1 View  Result Date: 10/16/2020 CLINICAL DATA:  Palpitations. Shortness of breath. Atrial fibrillation. EXAM: PORTABLE CHEST 1 VIEW COMPARISON:  None. FINDINGS: The heart size and mediastinal contours are within normal limits. Prior CABG again noted. Aortic atherosclerotic calcification noted. Both lungs are clear. The visualized skeletal structures are unremarkable. IMPRESSION: No active disease. Electronically Signed   By: Marlaine Hind M.D.   On: 10/16/2020 09:54   Scheduled Meds: . apixaban  5 mg Oral BID  . [START ON 10/18/2020] aspirin EC  81 mg Oral Daily  . atorvastatin  40 mg Oral  QPM  . carvedilol  12.5 mg Oral BID  . Chlorhexidine Gluconate Cloth  6 each Topical Q0600  . diltiazem  30 mg Oral Q6H  . insulin aspart  0-6 Units Subcutaneous TID WC  . isosorbide mononitrate  60 mg Oral Daily  . multivitamin  1 tablet Oral Daily  . sevelamer carbonate  800 mg Oral TID with meals   Continuous Infusions: . sodium chloride    . sodium chloride       LOS: 1 day   Time spent: 33 minutes   Reshonda Koerber Wynetta Emery, MD How to contact the [facility] Attending or Consulting provider [place] or covering provider during after hours [place], for this patient?  1. Check the care team in [facility] and look for a) attending/consulting [facility] provider listed and b) the [facility] team listed 2. Log into [email or web address] and use [place]'s universal password to access. If you do not have the password, please contact the hospital operator. 3. Locate the [facility] provider you are looking for under [facility] and page to a number that you can be directly reached. 4. If you still have difficulty reaching the provider, please page the [facility] (Director on Call) for the Hospitalists listed on amion for assistance.  10/17/2020, 11:55 AM

## 2020-10-18 DIAGNOSIS — Z720 Tobacco use: Secondary | ICD-10-CM

## 2020-10-18 DIAGNOSIS — Z992 Dependence on renal dialysis: Secondary | ICD-10-CM | POA: Diagnosis not present

## 2020-10-18 DIAGNOSIS — I4891 Unspecified atrial fibrillation: Secondary | ICD-10-CM | POA: Diagnosis not present

## 2020-10-18 DIAGNOSIS — Z9114 Patient's other noncompliance with medication regimen: Secondary | ICD-10-CM

## 2020-10-18 DIAGNOSIS — N186 End stage renal disease: Secondary | ICD-10-CM | POA: Diagnosis not present

## 2020-10-18 LAB — GLUCOSE, CAPILLARY
Glucose-Capillary: 148 mg/dL — ABNORMAL HIGH (ref 70–99)
Glucose-Capillary: 103 mg/dL — ABNORMAL HIGH (ref 70–99)
Glucose-Capillary: 147 mg/dL — ABNORMAL HIGH (ref 70–99)
Glucose-Capillary: 110 mg/dL — ABNORMAL HIGH (ref 70–99)
Glucose-Capillary: 145 mg/dL — ABNORMAL HIGH (ref 70–99)

## 2020-10-18 LAB — COMPREHENSIVE METABOLIC PANEL
ALT: 14 U/L (ref 0–44)
AST: 13 U/L — ABNORMAL LOW (ref 15–41)
Albumin: 3 g/dL — ABNORMAL LOW (ref 3.5–5.0)
Alkaline Phosphatase: 84 U/L (ref 38–126)
Anion gap: 15 (ref 5–15)
BUN: 30 mg/dL — ABNORMAL HIGH (ref 6–20)
CO2: 27 mmol/L (ref 22–32)
Calcium: 8.4 mg/dL — ABNORMAL LOW (ref 8.9–10.3)
Chloride: 94 mmol/L — ABNORMAL LOW (ref 98–111)
Creatinine, Ser: 10.11 mg/dL — ABNORMAL HIGH (ref 0.61–1.24)
GFR, Estimated: 5 mL/min — ABNORMAL LOW (ref >60)
Glucose, Bld: 135 mg/dL — ABNORMAL HIGH (ref 70–99)
Potassium: 3.8 mmol/L (ref 3.5–5.1)
Sodium: 136 mmol/L (ref 135–145)
Total Bilirubin: 0.6 mg/dL (ref 0.3–1.2)
Total Protein: 6.9 g/dL (ref 6.5–8.1)

## 2020-10-18 LAB — MAGNESIUM: Magnesium: 1.9 mg/dL (ref 1.7–2.4)

## 2020-10-18 LAB — PHOSPHORUS: Phosphorus: 6 mg/dL — ABNORMAL HIGH (ref 2.5–4.6)

## 2020-10-18 MED ORDER — DILTIAZEM HCL 60 MG PO TABS
60.0000 mg | ORAL_TABLET | Freq: Three times a day (TID) | ORAL | Status: DC
  Administered 2020-10-18 – 2020-10-19 (×3): 60 mg via ORAL
  Filled 2020-10-18 (×3): qty 1

## 2020-10-18 NOTE — Care Management Important Message (Signed)
Important Message  Patient Details  Name: Shaun Burns MRN: [id number] Date of Birth: 1971-01-10   Medicare Important Message Given:  Yes     Tommy Medal 10/18/2020, 12:16 PM

## 2020-10-18 NOTE — Progress Notes (Signed)
PROGRESS NOTE  Shaun Burns KVQ:[id number] DOB: 1971/08/30 DOA: 10/16/2020 PCP: Briant Sites, PA-C  Brief History:  49 year old male with a history of permanent atrial fibrillation (diagnosed 06/2020), coronary artery disease, ESRD (MWF) presenting from his dialysis unit secondary to being found in atrial fibrillation with RVR.  The patient states that he was recently mated to [facility] for atrial fibrillation, but review of care everywhere showed only an ED visit for epididymitis on 10/04/2020.  Patient denies any fevers, chills, chest pain, but he reports episodes of palpitations the past few months.  However he states that they are not particularly bothersome.  He states that he was in [facility] approximately 1 week prior to this admission due to abdominal pain and elevated heart rate.  Apparently a new prescription was prescribed, but he did not pick this up from the pharmacy.  Review of the record shows that Cardizem CD was listed, but he has never taken this.  In the emergency department, the patient had a hemoglobin of WBC 9.4, hemoglobin 13.2, platelets 307,000.Na+ 136, K+ 4.5, and creatinine 13.0. Mg 1.9. BNP 1898. Initial HS Troponin 520 with no repeat values obtained. CXR with no active disease. EKG showed atrial fibrillation with RVR, HR 129 with ST depression along the lateral leads which is similar to prior tracings.  The patient was given diltiazem CD 10 mg IV in the ED at any time and started back on his Coreg and Cardizem CD.  Assessment/Plan: Atrial fibrillation with RVR--persistent -appreciate cardiology consult -continue coreg and diltiazem -Continue apixaban  Coronary artery disease/elevated troponin -Troponins flat -Secondary to demand ischemia from the patient's atrial fibrillation with RVR -Continue aspirin -Continue statin -s/p CABG in 2019 with LIMA-LAD-D1 and SVG-PDA. He did have an abnormal NST in 05/2020 with cath showing high grade  mLAD, D1 prox and after bifurcation, D2 ostial, mLCx obstructive, LIMA-D2-LAD patent, SVG-RCA occluded, and CTO RCA with L-->R collaterals and medical management was recommend -Continue Imdur  ESRD -Appreciate nephrology follow-up -Usually Monday, Wednesday, Friday schedule -Planning again for dialysis 10/19/2020  Essential hypertension -Continue carvedilol, diltiazem, Imdur  Hyperlipidemia -Continue statin  Diabetes mellitus type 2 -d/c NovoLog sliding scale -10/16/2020 hemoglobin A1c 6.3  Tobacco abuse -Tobacco cessation discussed -NicoDerm patch       Status is: Inpatient  Remains inpatient appropriate because:Unsafe d/c plan and IV treatments appropriate due to intensity of illness or inability to take PO   Dispo:  Patient From: Home  Planned Disposition: Home  Expected discharge date: 10/19/20  Medically stable for discharge: No         Family Communication:  no Family at bedside  Consultants:  Renal, cardiology  Code Status:  FULL  DVT Prophylaxis:  apixaban   Procedures: As Listed in Progress Note Above  Antibiotics: None       Subjective: Patient denies fevers, chills, headache, chest pain, dyspnea, nausea, vomiting, diarrhea, abdominal pain, dysuria, hematuria, hematochezia, and melena.   Objective: Vitals:   10/17/20 2341 10/18/20 0602 10/18/20 1000 10/18/20 1311  BP: 120/60 (!) 118/91 127/88 114/86  Pulse: 92 75 (!) 101   Resp: 18 18 18    Temp: 98.6 F (37 C) 98.9 F (37.2 C) 99 F (37.2 C)   TempSrc: Oral Oral Oral   SpO2: 100% 92% 99%   Weight: 94 kg 94.6 kg    Height:        Intake/Output Summary (Last 24 hours) at 10/18/2020 1606 Last data  filed at 10/18/2020 1000 Gross per 24 hour  Intake 460 ml  Output 1800 ml  Net -1340 ml   Weight change: 3.013 kg Exam:   General:  Pt is alert, follows commands appropriately, not in acute distress  HEENT: No icterus, No thrush, No neck mass, [place]/AT  Cardiovascular:  IRRR, S1/S2, no rubs, no gallops  Respiratory: CTA bilaterally, no wheezing, no crackles, no rhonchi  Abdomen: Soft/+BS, non tender, non distended, no guarding  Extremities: No edema, No lymphangitis, No petechiae, No rashes, no synovitis   Data Reviewed: I have personally reviewed following labs and imaging studies Basic Metabolic Panel: Recent Labs  Lab 10/16/20 1007 10/16/20 1300 10/17/20 0709 10/18/20 0442  NA 138 136 137 136  K 4.8 4.5 4.3 3.8  CL 92* 94* 95* 94*  CO2 27 25 24 27   GLUCOSE 113* 93 82 135*  BUN 39* 40* 46* 30*  CREATININE 12.72* 13.00* 14.54* 10.11*  CALCIUM 8.6* 8.4* 8.1* 8.4*  MG 2.0  --  1.9 1.9  PHOS  --  8.4* 8.8* 6.0*   Liver Function Tests: Recent Labs  Lab 10/16/20 1300 10/17/20 0709 10/18/20 0442  AST  --  12* 13*  ALT  --  16 14  ALKPHOS  --  83 84  BILITOT  --  0.8 0.6  PROT  --  6.9 6.9  ALBUMIN 3.8 3.1* 3.0*   No results for input(s): LIPASE, AMYLASE in the last 168 hours. No results for input(s): AMMONIA in the last 168 hours. Coagulation Profile: No results for input(s): INR, PROTIME in the last 168 hours. CBC: Recent Labs  Lab 10/16/20 1331 10/17/20 0709  WBC 9.4 7.4  NEUTROABS  --  5.1  HGB 13.2 11.5*  HCT 41.3 36.7*  MCV 87.3 87.8  PLT 307 280   Cardiac Enzymes: No results for input(s): CKTOTAL, CKMB, CKMBINDEX, TROPONINI in the last 168 hours. BNP: Invalid input(s): POCBNP CBG: Recent Labs  Lab 10/17/20 1704 10/17/20 2340 10/18/20 0259 10/18/20 0736 10/18/20 1112  GLUCAP 113* 96 148* 103* 147*   HbA1C: Recent Labs    10/16/20 1300  HGBA1C 6.3*   Urine analysis: No results found for: COLORURINE, APPEARANCEUR, LABSPEC, PHURINE, GLUCOSEU, HGBUR, BILIRUBINUR, KETONESUR, PROTEINUR, UROBILINOGEN, NITRITE, LEUKOCYTESUR Sepsis Labs: @LABRCNTIP (procalcitonin:4,lacticidven:4) ) Recent Results (from the past 240 hour(s))  Resp Panel by RT PCR (RSV, Flu A&B, Covid) - Nasopharyngeal Swab     Status: None    Collection Time: 10/16/20 10:16 AM   Specimen: Nasopharyngeal Swab  Result Value Ref Range Status   SARS Coronavirus 2 by RT PCR NEGATIVE NEGATIVE Final    Comment: (NOTE) SARS-CoV-2 target nucleic acids are NOT DETECTED.  The SARS-CoV-2 RNA is generally detectable in upper respiratoy specimens during the acute phase of infection. The lowest concentration of SARS-CoV-2 viral copies this assay can detect is 131 copies/mL. A negative result does not preclude SARS-Cov-2 infection and should not be used as the sole basis for treatment or other patient management decisions. A negative result may occur with  improper specimen collection/handling, submission of specimen other than nasopharyngeal swab, presence of viral mutation(s) within the areas targeted by this assay, and inadequate number of viral copies (<131 copies/mL). A negative result must be combined with clinical observations, patient history, and epidemiological information. The expected result is Negative.  Fact Sheet for Patients:  PinkCheek.be  Fact Sheet for Healthcare Providers:  GravelBags.it  This test is no t yet approved or cleared by the [place] FDA and  has  been authorized for detection and/or diagnosis of SARS-CoV-2 by FDA under an Emergency Use Authorization (EUA). This EUA will remain  in effect (meaning this test can be used) for the duration of the COVID-19 declaration under Section 564(b)(1) of the Act, 21 U.S.C. section 360bbb-3(b)(1), unless the authorization is terminated or revoked sooner.     Influenza A by PCR NEGATIVE NEGATIVE Final   Influenza B by PCR NEGATIVE NEGATIVE Final    Comment: (NOTE) The Xpert Xpress SARS-CoV-2/FLU/RSV assay is intended as an aid in  the diagnosis of influenza from Nasopharyngeal swab specimens and  should not be used as a sole basis for treatment. Nasal washings and  aspirates are unacceptable for Xpert  Xpress SARS-CoV-2/FLU/RSV  testing.  Fact Sheet for Patients: PinkCheek.be  Fact Sheet for Healthcare Providers: GravelBags.it  This test is not yet approved or cleared by the [place] FDA and  has been authorized for detection and/or diagnosis of SARS-CoV-2 by  FDA under an Emergency Use Authorization (EUA). This EUA will remain  in effect (meaning this test can be used) for the duration of the  Covid-19 declaration under Section 564(b)(1) of the Act, 21  U.S.C. section 360bbb-3(b)(1), unless the authorization is  terminated or revoked.    Respiratory Syncytial Virus by PCR NEGATIVE NEGATIVE Final    Comment: (NOTE) Fact Sheet for Patients: PinkCheek.be  Fact Sheet for Healthcare Providers: GravelBags.it  This test is not yet approved or cleared by the [place] FDA and  has been authorized for detection and/or diagnosis of SARS-CoV-2 by  FDA under an Emergency Use Authorization (EUA). This EUA will remain  in effect (meaning this test can be used) for the duration of the  COVID-19 declaration under Section 564(b)(1) of the Act, 21 U.S.C.  section 360bbb-3(b)(1), unless the authorization is terminated or  revoked. Performed at [facility], [address], [place], [place] [address]   Culture, blood (single)     Status: None (Preliminary result)   Collection Time: 10/17/20  8:00 PM   Specimen: Left Antecubital; Blood  Result Value Ref Range Status   Specimen Description LEFT ANTECUBITAL  Final   Special Requests   Final    BOTTLES DRAWN AEROBIC AND ANAEROBIC Blood Culture adequate volume   Culture   Final    NO GROWTH < 12 HOURS Performed at [facility], [address], [place], [place] [address]    Report Status PENDING  Incomplete     Scheduled Meds: . apixaban  5 mg Oral BID  . aspirin EC  81 mg Oral Daily  . atorvastatin  40 mg Oral QPM  .  carvedilol  12.5 mg Oral BID  . Chlorhexidine Gluconate Cloth  6 each Topical Q0600  . diltiazem  60 mg Oral Q8H  . insulin aspart  0-6 Units Subcutaneous TID WC  . isosorbide mononitrate  60 mg Oral Daily  . multivitamin  1 tablet Oral Daily  . sevelamer carbonate  800 mg Oral TID with meals   Continuous Infusions: . sodium chloride    . sodium chloride      Procedures/Studies: DG Chest Port 1 View  Result Date: 10/16/2020 CLINICAL DATA:  Palpitations. Shortness of breath. Atrial fibrillation. EXAM: PORTABLE CHEST 1 VIEW COMPARISON:  None. FINDINGS: The heart size and mediastinal contours are within normal limits. Prior CABG again noted. Aortic atherosclerotic calcification noted. Both lungs are clear. The visualized skeletal structures are unremarkable. IMPRESSION: No active disease. Electronically Signed   By: Myles Rosenthal.D.  On: 10/16/2020 09:54   ECHOCARDIOGRAM LIMITED  Result Date: 10/17/2020    ECHOCARDIOGRAM LIMITED REPORT   Patient Name:   Shaun Burns Date of Exam: 10/17/2020 Medical Rec #:  [id number]     Height:       70.0 in Accession #:    [id number]    Weight:       210.7 lb Date of Birth:  August 07, 1971      BSA:          2.134 m Patient Age:    83 years      BP:           125/85 mmHg Patient Gender: M             HR:           99 bpm. 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                 CAD, Prior CABG, Arrythmias:Atrial Fibrillation; Risk                 Factors:Hypertension, Diabetes and Tobacco abuse. ESRD on                 dialysis.  Sonographer:    Alvino Chapel RCS Referring Phys: [id number] [place]  1. Left ventricular ejection fraction, by estimation, is 50 to 55%. The left ventricle has low normal function. The left ventricle has no regional wall motion  abnormalities. There is severe left ventricular hypertrophy.  2. Right ventricular systolic function is normal. The right ventricular size is normal.  3. There is mild calcification of the aortic valve. There is mild thickening of the aortic valve.  4. Limited echo to evaluate LV function FINDINGS  Left Ventricle: Left ventricular ejection fraction, by estimation, is 50 to 55%. The left ventricle has low normal function. The left ventricle has no regional wall motion abnormalities. The left ventricular internal cavity size was normal in size. There is severe left ventricular hypertrophy. Right Ventricle: The right ventricular size is normal. No increase in right ventricular wall thickness. Right ventricular systolic function is normal. Pericardium: There is no evidence of pericardial effusion. Aortic Valve: There is mild calcification of the aortic valve. There is mild thickening of the aortic valve. There is mild aortic valve annular calcification. LEFT VENTRICLE PLAX 2D LVIDd:         4.74 cm LVIDs:         3.55 cm LV PW:         1.64 cm LV IVS:        1.63 cm LVOT diam:     2.10 cm LVOT Area:     3.46 cm  LV Volumes (MOD) LV vol d, MOD A2C: 131.0 ml LV vol d, MOD A4C: 147.0 ml LV vol s, MOD A2C: 72.7 ml LV vol s, MOD A4C: 79.9 ml LV SV MOD A2C:     58.3 ml LV SV MOD A4C:     147.0 ml LV SV MOD BP:      63.2 ml LEFT ATRIUM         Index LA diam:    4.55 cm 2.13 cm/m   AORTA Ao Root diam: 3.10 cm  SHUNTS Systemic Diam: 2.10 cm Carlyle Dolly MD Electronically signed by Carlyle Dolly MD Signature Date/Time: 10/17/2020/4:16:16 PM    Final     Orson Eva, DO  [facility]  If 7PM-7AM, please contact night-coverage [email or web address] Password [email or web address] 10/18/2020, 4:06 PM   LOS: 2 days

## 2020-10-18 NOTE — Progress Notes (Addendum)
  [facility] Progress Note   Assessment/ Plan:   Dialysis Orders: [facility]. QHU76 kgHD Bath 2K/2.5CaTime 4:15Heparin none. AccessLUE AVFBFR 450DFR 600  Hectoral 8mcg IV/HD Epogen noneOther Sensipar 30 mg po qHD  Assessment/Plan: 1. Atrial Fibrillation with RVR- started on cardizem.   Cardiology consulted, appreciate assistance.  Med list looks like he should be on carvedilol and dilt.  On Eliquis 2. ESRD- HD off schedule Tues, will plan for off schedule tomorrow (Thrusday) and then back to regular MWF schedule by Friday.  3. Hypertension/volume- BP variable and drops with tachycardia.Improved on diltiazem 4. Anemia- no ESA 5. Metabolic bone disease- Cont with outpatient meds. hectorol 2 mcg and sensipar 30 q rx 6. Nutrition- Renal diet, carb modified 7. DM - per primary 8. Disposition- per primary. If gets cleared for d/c today then would recommend rx off schedule at OP center tomorrow (CM can help facilitate) and then back to normal rx Friday  Subjective:    Had HD yesterday with 1.8L removed.  Cardiology consulted yesterday, recommended TTE which showed EF 50-55%, no WMA, and severe LVH.     Objective:   BP (!) 118/91   Pulse 75   Temp 98.9 F (37.2 C) (Oral)   Resp 18   Ht 5\' 10"  (1.778 m)   Wt 94.6 kg   SpO2 92%   BMI 29.92 kg/m   Physical Exam: Gen: NAD, sitting up in bed CVS: Irregular, normal rate Resp: clear bilaterally Abd: soft Ext: trace LE edema ACCESS" LUE AVF +T/B  Labs: BMET Recent Labs  Lab 10/16/20 1007 10/16/20 1300 10/17/20 0709 10/18/20 0442  NA 138 136 137 136  K 4.8 4.5 4.3 3.8  CL 92* 94* 95* 94*  CO2 27 25 24 27   GLUCOSE 113* 93 82 135*  BUN 39* 40* 46* 30*  CREATININE 12.72* 13.00* 14.54* 10.11*  CALCIUM 8.6* 8.4* 8.1* 8.4*  PHOS  --  8.4* 8.8* 6.0*   CBC Recent Labs  Lab 10/16/20 1331 10/17/20 0709  WBC 9.4 7.4  NEUTROABS  --  5.1  HGB 13.2 11.5*  HCT 41.3  36.7*  MCV 87.3 87.8  PLT 307 280      Medications:    . apixaban  5 mg Oral BID  . aspirin EC  81 mg Oral Daily  . atorvastatin  40 mg Oral QPM  . carvedilol  12.5 mg Oral BID  . Chlorhexidine Gluconate Cloth  6 each Topical Q0600  . diltiazem  30 mg Oral Q6H  . insulin aspart  0-6 Units Subcutaneous TID WC  . isosorbide mononitrate  60 mg Oral Daily  . multivitamin  1 tablet Oral Daily  . sevelamer carbonate  800 mg Oral TID with meals     Madelon Lips, MD 10/18/2020, 9:46 AM

## 2020-10-18 NOTE — Progress Notes (Signed)
Progress Note  Patient Name: Shaun Burns Date of Encounter: 10/18/2020  Primary Cardiologist:   Maia Plan, NP - [place]  Subjective   No chest pain or shortness of breath at rest, no sense of palpitations.  States that he tolerated hemodialysis yesterday.  Inpatient Medications    Scheduled Meds: . apixaban  5 mg Oral BID  . aspirin EC  81 mg Oral Daily  . atorvastatin  40 mg Oral QPM  . carvedilol  12.5 mg Oral BID  . Chlorhexidine Gluconate Cloth  6 each Topical Q0600  . diltiazem  30 mg Oral Q6H  . insulin aspart  0-6 Units Subcutaneous TID WC  . isosorbide mononitrate  60 mg Oral Daily  . multivitamin  1 tablet Oral Daily  . sevelamer carbonate  800 mg Oral TID with meals   Continuous Infusions: . sodium chloride    . sodium chloride     PRN Meds: sodium chloride, sodium chloride, acetaminophen **OR** acetaminophen, albuterol, alteplase, bisacodyl, heparin, lidocaine (PF), lidocaine-prilocaine, nicotine, ondansetron **OR** ondansetron (ZOFRAN) IV, pentafluoroprop-tetrafluoroeth   Vital Signs    Vitals:   10/17/20 2300 10/17/20 2341 10/18/20 0602 10/18/20 1000  BP: (!) 91/50 120/60 (!) 118/91 127/88  Pulse: 68 92 75 (!) 101  Resp: 18 18 18 18   Temp:  98.6 F (37 C) 98.9 F (37.2 C) 99 F (37.2 C)  TempSrc:  Oral Oral Oral  SpO2:  100% 92% 99%  Weight:  94 kg 94.6 kg   Height:        Intake/Output Summary (Last 24 hours) at 10/18/2020 1026 Last data filed at 10/18/2020 1000 Gross per 24 hour  Intake 580 ml  Output 1800 ml  Net -1220 ml   Filed Weights   10/17/20 2011 10/17/20 2341 10/18/20 0602  Weight: 96 kg 94 kg 94.6 kg    Telemetry    Atrial fibrillation, heart rate 110-115.  Personally reviewed.  ECG    An ECG dated 10/17/2020 was personally reviewed today and demonstrated:  Atrial fibrillation with RVR, IVCD with poor R wave progression and diffuse ST-T wave abnormalities.  Physical Exam   GEN: No  acute distress.   Neck: No JVD. Cardiac:  Irregularly irregular, no gallop.  Respiratory: Nonlabored. Clear to auscultation bilaterally. GI: Soft, nontender, bowel sounds present. MS: No edema; No deformity.  Labs    Chemistry Recent Labs  Lab 10/16/20 1300 10/17/20 0709 10/18/20 0442  NA 136 137 136  K 4.5 4.3 3.8  CL 94* 95* 94*  CO2 25 24 27   GLUCOSE 93 82 135*  BUN 40* 46* 30*  CREATININE 13.00* 14.54* 10.11*  CALCIUM 8.4* 8.1* 8.4*  PROT  --  6.9 6.9  ALBUMIN 3.8 3.1* 3.0*  AST  --  12* 13*  ALT  --  16 14  ALKPHOS  --  83 84  BILITOT  --  0.8 0.6  GFRNONAA 4* 3* 5*  ANIONGAP 17* 18* 15     Hematology Recent Labs  Lab 10/16/20 1331 10/17/20 0709  WBC 9.4 7.4  RBC 4.73 4.18*  HGB 13.2 11.5*  HCT 41.3 36.7*  MCV 87.3 87.8  MCH 27.9 27.5  MCHC 32.0 31.3  RDW 15.7* 15.7*  PLT 307 280    Cardiac Enzymes Recent Labs  Lab 10/16/20 1007 10/17/20 0941  TROPONINIHS 520* 519*    BNP Recent Labs  Lab 10/16/20 1007  BNP 1,898.0*     Radiology    ECHOCARDIOGRAM LIMITED  Result Date: 10/17/2020    ECHOCARDIOGRAM LIMITED REPORT   Patient Name:   Shaun Burns Date of Exam: 10/17/2020 Medical Rec #:  [id number]     Height:       70.0 in Accession #:    [id number]    Weight:       210.7 lb Date of Birth:  [birth year]      BSA:          2.134 m Patient Age:    74 years      BP:           125/85 mmHg Patient Gender: M             HR:           99 bpm. 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                 CAD, Prior CABG, Arrythmias:Atrial Fibrillation; Risk                 Factors:Hypertension, Diabetes and Tobacco abuse. ESRD on                 dialysis.  Sonographer:    Alvino Chapel RCS Referring Phys: [id number] [place]  1. Left ventricular ejection  fraction, by estimation, is 50 to 55%. The left ventricle has low normal function. The left ventricle has no regional wall motion abnormalities. There is severe left ventricular hypertrophy.  2. Right ventricular systolic function is normal. The right ventricular size is normal.  3. There is mild calcification of the aortic valve. There is mild thickening of the aortic valve.  4. Limited echo to evaluate LV function FINDINGS  Left Ventricle: Left ventricular ejection fraction, by estimation, is 50 to 55%. The left ventricle has low normal function. The left ventricle has no regional wall motion abnormalities. The left ventricular internal cavity size was normal in size. There is severe left ventricular hypertrophy. Right Ventricle: The right ventricular size is normal. No increase in right ventricular wall thickness. Right ventricular systolic function is normal. Pericardium: There is no evidence of pericardial effusion. Aortic Valve: There is mild calcification of the aortic valve. There is mild thickening of the aortic valve. There is mild aortic valve annular calcification. LEFT VENTRICLE PLAX 2D LVIDd:         4.74 cm LVIDs:         3.55 cm LV PW:         1.64 cm LV IVS:        1.63 cm LVOT diam:     2.10 cm LVOT Area:     3.46 cm  LV Volumes (MOD) LV vol d, MOD A2C: 131.0 ml LV vol d, MOD A4C: 147.0 ml LV vol s, MOD A2C: 72.7 ml LV vol s, MOD A4C: 79.9 ml LV SV MOD A2C:     58.3 ml LV SV MOD A4C:     147.0 ml LV SV MOD BP:      63.2 ml LEFT ATRIUM         Index LA diam:    4.55 cm 2.13 cm/m   AORTA Ao Root diam: 3.10 cm  SHUNTS Systemic Diam: 2.10 cm Carlyle Dolly  MD Electronically signed by Carlyle Dolly MD Signature Date/Time: 10/17/2020/4:16:16 PM    Final     Patient Profile     49 y.o. male with past medical history of CAD (s/p CABG in 2019 with LIMA-LAD-D1 and SVG-PDA, abnormal NST in 05/2020 with cath showing high grade mLAD, D1 prox and after bifurcation, D2 ostial, mLCx obstructive,  LIMA-D2-LAD patent, SVG-RCA occluded, and CTO RCA with L-->R collaterals and medical management was recommended), HTN, HLD, ESRD and paroxysmal atrial fibrillation (diagnosed in 06/2020 by review of Care Everywhere) who is being seen for the evaluation of atrial fibrillation with RVR.  Assessment & Plan    1.  Persistent atrial fibrillation, presenting with RVR and generally improved heart rates at this point although not optimal as yet on combination of Coreg and recently initiated low-dose Cardizem.  CHA2DS2-VASc score is 2 and he continues on Eliquis for stroke prophylaxis.  Follow-up echocardiogram shows LVEF 50 to 55% with severe LVH.  2.  ESRD on hemodialysis.  Nephrology following.  3.  Elevated high-sensitivity troponin I level in flat pattern, 500 range.  Most consistent with demand ischemia in the setting of elevated heart rate and with baseline ESRD.  Continue Coreg 12.5 mg twice daily.  Increase Cardizem to 60 mg p.o. every 8 hours, go ahead and give initial dose this morning.  Might be able to consolidate to Cardizem CD 180 mg starting tomorrow depending on heart rate control.  Continue Eliquis for stroke prophylaxis.  Signed, Rozann Lesches, MD  10/18/2020, 10:26 AM

## 2020-10-19 DIAGNOSIS — I482 Chronic atrial fibrillation, unspecified: Secondary | ICD-10-CM

## 2020-10-19 DIAGNOSIS — I2510 Atherosclerotic heart disease of native coronary artery without angina pectoris: Secondary | ICD-10-CM

## 2020-10-19 LAB — GLUCOSE, CAPILLARY
Glucose-Capillary: 117 mg/dL — ABNORMAL HIGH (ref 70–99)
Glucose-Capillary: 103 mg/dL — ABNORMAL HIGH (ref 70–99)
Glucose-Capillary: 103 mg/dL — ABNORMAL HIGH (ref 70–99)

## 2020-10-19 LAB — MAGNESIUM: Magnesium: 1.9 mg/dL (ref 1.7–2.4)

## 2020-10-19 LAB — COMPREHENSIVE METABOLIC PANEL
ALT: 13 U/L (ref 0–44)
AST: 11 U/L — ABNORMAL LOW (ref 15–41)
Albumin: 3.2 g/dL — ABNORMAL LOW (ref 3.5–5.0)
Alkaline Phosphatase: 83 U/L (ref 38–126)
Anion gap: 17 — ABNORMAL HIGH (ref 5–15)
BUN: 40 mg/dL — ABNORMAL HIGH (ref 6–20)
CO2: 26 mmol/L (ref 22–32)
Calcium: 8.6 mg/dL — ABNORMAL LOW (ref 8.9–10.3)
Chloride: 93 mmol/L — ABNORMAL LOW (ref 98–111)
Creatinine, Ser: 12.39 mg/dL — ABNORMAL HIGH (ref 0.61–1.24)
GFR, Estimated: 4 mL/min — ABNORMAL LOW (ref >60)
Glucose, Bld: 83 mg/dL (ref 70–99)
Potassium: 4.2 mmol/L (ref 3.5–5.1)
Sodium: 136 mmol/L (ref 135–145)
Total Bilirubin: 0.6 mg/dL (ref 0.3–1.2)
Total Protein: 6.9 g/dL (ref 6.5–8.1)

## 2020-10-19 LAB — PHOSPHORUS: Phosphorus: 8.2 mg/dL — ABNORMAL HIGH (ref 2.5–4.6)

## 2020-10-19 MED ORDER — DILTIAZEM HCL ER COATED BEADS 180 MG PO CP24
180.0000 mg | ORAL_CAPSULE | Freq: Every day | ORAL | 1 refills | Status: DC
Start: 2020-10-19 — End: 2021-09-18

## 2020-10-19 MED ORDER — DILTIAZEM HCL ER COATED BEADS 180 MG PO CP24
180.0000 mg | ORAL_CAPSULE | Freq: Every day | ORAL | Status: DC
  Administered 2020-10-19: 180 mg via ORAL
  Filled 2020-10-19: qty 1

## 2020-10-19 MED ORDER — APIXABAN 5 MG PO TABS
5.0000 mg | ORAL_TABLET | Freq: Two times a day (BID) | ORAL | 1 refills | Status: DC
Start: 2020-10-19 — End: 2021-09-18

## 2020-10-19 NOTE — Progress Notes (Signed)
Pt continues with dialysis treatment. No c/o at present.

## 2020-10-19 NOTE — Discharge Summary (Addendum)
Physician Discharge Summary  Shaun Burns [id number]:[id number] DOB: 1971-12-17 DOA: 10/16/2020  PCP: Briant Sites, PA-C  Admit date: 10/16/2020 Discharge date: 10/19/2020  Admitted From: Home Disposition:  Home   Recommendations for Outpatient Follow-up:  1. Follow up with PCP in 1-2 weeks 2. Please obtain BMP/CBC in one week     Discharge Condition: Stable CODE STATUS: FULL Diet recommendation: Heart Healthy / Carb Modified   Brief/Interim Summary: 49 year old male with a history of permanent atrial fibrillation (diagnosed 06/2020), coronary artery disease, ESRD (MWF) presenting from his dialysis unit secondary to being found in atrial fibrillation with RVR.  The patient states that he was recently mated to [facility] for atrial fibrillation, but review of care everywhere showed only an ED visit for epididymitis on 10/04/2020.  Patient denies any fevers, chills, chest pain, but he reports episodes of palpitations the past few months.  However he states that they are not particularly bothersome.  He states that he was in [facility] approximately 1 week prior to this admission due to abdominal pain and elevated heart rate.  Apparently a new prescription was prescribed, but he did not pick this up from the pharmacy.  Review of the record shows that Cardizem CD was listed, but he has never taken this.  In the emergency department, the patient had a hemoglobin of WBC 9.4, hemoglobin 13.2, platelets 307,000.Na+ 136, K+ 4.5, and creatinine 13.0. Mg 1.9. BNP 1898. Initial HS Troponin 520 with no repeat values obtained. CXR with no active disease. EKG showed atrial fibrillation with RVR, HR 129 with ST depression along the lateral leads which is similar to prior tracings.  The patient was given diltiazem CD 10 mg IV in the ED at any time and started back on his Coreg and Cardizem CD.  Discharge Diagnoses:  Atrial fibrillation with RVR--persistent -appreciate cardiology  consult -continue coreg and diltiazem -Continue apixaban -d/c home with cardizem CD 180 mg daily -rate controlled at time of dc  Coronary artery disease/elevated troponin -Troponins flat -Secondary to demand ischemia from the patient's atrial fibrillation with RVR -Continue aspirin -Continue statin -s/p CABG in 2019 with LIMA-LAD-D1 and SVG-PDA. He did have anabnormal NST in 05/2020 with cath showing high grade mLAD, D1 prox and after bifurcation, D2 ostial, mLCx obstructive, LIMA-D2-LAD patent, SVG-RCA occluded,andCTO RCAwithL-->R collateralsand medical management was recommend -Continue Imdur  ESRD -Appreciate nephrology follow-up -Usually Monday, Wednesday, Friday schedule -Last dialysis 10/19/2020  Essential hypertension -Continue carvedilol, diltiazem, Imdur  Hyperlipidemia -Continue statin  Diabetes mellitus type 2 -d/c NovoLog sliding scale -10/16/2020 hemoglobin A1c 6.3  Tobacco abuse -Tobacco cessation discussed -NicoDerm patch   Discharge Instructions   Allergies as of 10/19/2020   No Known Allergies     Medication List    STOP taking these medications   amLODipine 5 MG tablet Commonly known as: NORVASC   ciprofloxacin 500 MG tablet Commonly known as: CIPRO   doxycycline 100 MG capsule Commonly known as: VIBRAMYCIN     TAKE these medications   acetaminophen 500 MG tablet Commonly known as: TYLENOL Take 1,500 mg by mouth 2 (two) times daily as needed for moderate pain.   albuterol 108 (90 Base) MCG/ACT inhaler Commonly known as: VENTOLIN HFA Inhale 2 puffs into the lungs as needed.   apixaban 5 MG Tabs tablet Commonly known as: ELIQUIS Take 1 tablet (5 mg total) by mouth 2 (two) times daily. What changed:   medication strength  how much to take  when to take this   aspirin EC  81 MG tablet Take 81 mg by mouth 2 (two) times daily.   atorvastatin 40 MG tablet Commonly known as: LIPITOR Take 40 mg by mouth every  evening.   carvedilol 12.5 MG tablet Commonly known as: COREG Take 12.5 mg by mouth 2 (two) times daily.   diltiazem 180 MG 24 hr capsule Commonly known as: CARDIZEM CD Take 1 capsule (180 mg total) by mouth daily. What changed:   medication strength  how much to take   isosorbide mononitrate 60 MG 24 hr tablet Commonly known as: IMDUR Take 60 mg by mouth daily.   multivitamin Tabs tablet Take 1 tablet by mouth daily.   sevelamer carbonate 800 MG tablet Commonly known as: RENVELA Take 800 mg by mouth 3 (three) times daily.       Follow-up Information    Call  Briant Sites, PA-C.   Specialty: Nephrology Why: Regarding today's encounter.       Scott, Chantha Prak. Schedule an appointment as soon as possible for a visit in 2 week(s).   Specialty: Cardiology Contact information: [place] [address] [phone]              No Known Allergies  Consultations:  Renal  cardiology   Procedures/Studies: [facility] Chest Port 1 View  Result Date: 10/16/2020 CLINICAL DATA:  Palpitations. Shortness of breath. Atrial fibrillation. EXAM: PORTABLE CHEST 1 VIEW COMPARISON:  None. FINDINGS: The heart size and mediastinal contours are within normal limits. Prior CABG again noted. Aortic atherosclerotic calcification noted. Both lungs are clear. The visualized skeletal structures are unremarkable. IMPRESSION: No active disease. Electronically Signed   By: Marlaine Hind M.D.   On: 10/16/2020 09:54   ECHOCARDIOGRAM LIMITED  Result Date: 10/17/2020    ECHOCARDIOGRAM LIMITED REPORT   Patient Name:   Shaun Burns Date of Exam: 10/17/2020 Medical Rec #:  [id number]     Height:       70.0 in Accession #:    [id number]    Weight:       210.7 lb Date of Birth:  September 21, 1971      BSA:          2.134 m Patient Age:    60 years      BP:           125/85 mmHg Patient Gender: M             HR:           99 bpm. Exam Location:  [facility] Procedure: Limited Echo  Indications:    Reassessment of EF; Concern for tachycardia-mediated                 cardiomyopathy  History:        Patient has no prior history of Echocardiogram examinations.                 CAD, Prior CABG, Arrythmias:Atrial Fibrillation; Risk                 Factors:Hypertension, Diabetes and Tobacco abuse. ESRD on                 dialysis.  Sonographer:    Alvino Chapel RCS Referring Phys: [id number] [place]  1. Left ventricular ejection fraction, by estimation, is 50 to 55%. The left ventricle has low normal function. The left ventricle has no regional wall motion abnormalities. There is severe left ventricular hypertrophy.  2. Right ventricular systolic function is normal. The  right ventricular size is normal.  3. There is mild calcification of the aortic valve. There is mild thickening of the aortic valve.  4. Limited echo to evaluate LV function FINDINGS  Left Ventricle: Left ventricular ejection fraction, by estimation, is 50 to 55%. The left ventricle has low normal function. The left ventricle has no regional wall motion abnormalities. The left ventricular internal cavity size was normal in size. There is severe left ventricular hypertrophy. Right Ventricle: The right ventricular size is normal. No increase in right ventricular wall thickness. Right ventricular systolic function is normal. Pericardium: There is no evidence of pericardial effusion. Aortic Valve: There is mild calcification of the aortic valve. There is mild thickening of the aortic valve. There is mild aortic valve annular calcification. LEFT VENTRICLE PLAX 2D LVIDd:         4.74 cm LVIDs:         3.55 cm LV PW:         1.64 cm LV IVS:        1.63 cm LVOT diam:     2.10 cm LVOT Area:     3.46 cm  LV Volumes (MOD) LV vol d, MOD A2C: 131.0 ml LV vol d, MOD A4C: 147.0 ml LV vol s, MOD A2C: 72.7 ml LV vol s, MOD A4C: 79.9 ml LV SV MOD A2C:     58.3 ml LV SV MOD A4C:     147.0 ml LV SV MOD BP:      63.2 ml LEFT ATRIUM          Index LA diam:    4.55 cm 2.13 cm/m   AORTA Ao Root diam: 3.10 cm  SHUNTS Systemic Diam: 2.10 cm Carlyle Dolly MD Electronically signed by Carlyle Dolly MD Signature Date/Time: 10/17/2020/4:16:16 PM    Final          Discharge Exam: Vitals:   10/19/20 1100 10/19/20 1130  BP: (!) 130/58 135/73  Pulse: 72 90  Resp: 18 18  Temp:    SpO2:     Vitals:   10/19/20 1037 10/19/20 1045 10/19/20 1100 10/19/20 1130  BP: 111/74 109/79 (!) 130/58 135/73  Pulse: 82 84 72 90  Resp: 18 18 18 18   Temp: 98.1 F (36.7 C)     TempSrc: Oral     SpO2: 96%     Weight: 95.5 kg     Height:        General: Pt is alert, awake, not in acute distress Cardiovascular: IRRR, S1/S2 +, no rubs, no gallops Respiratory: bibasilar rales. No wheeze Abdominal: Soft, NT, ND, bowel sounds + Extremities: no edema, no cyanosis   The results of significant diagnostics from this hospitalization (including imaging, microbiology, ancillary and laboratory) are listed below for reference.    Significant Diagnostic Studies: DG Chest Port 1 View  Result Date: 10/16/2020 CLINICAL DATA:  Palpitations. Shortness of breath. Atrial fibrillation. EXAM: PORTABLE CHEST 1 VIEW COMPARISON:  None. FINDINGS: The heart size and mediastinal contours are within normal limits. Prior CABG again noted. Aortic atherosclerotic calcification noted. Both lungs are clear. The visualized skeletal structures are unremarkable. IMPRESSION: No active disease. Electronically Signed   By: Marlaine Hind M.D.   On: 10/16/2020 09:54   ECHOCARDIOGRAM LIMITED  Result Date: 10/17/2020    ECHOCARDIOGRAM LIMITED REPORT   Patient Name:   Shaun Burns Date of Exam: 10/17/2020 Medical Rec #:  [id number]     Height:       70.0 in Accession #:  [id number]    Weight:       210.7 lb Date of Birth:  1971/07/01      BSA:          2.134 m Patient Age:    64 years      BP:           125/85 mmHg Patient Gender: M             HR:           99 bpm. Exam  Location:  [facility] Procedure: Limited Echo Indications:    Reassessment of EF; Concern for tachycardia-mediated                 cardiomyopathy  History:        Patient has no prior history of Echocardiogram examinations.                 CAD, Prior CABG, Arrythmias:Atrial Fibrillation; Risk                 Factors:Hypertension, Diabetes and Tobacco abuse. ESRD on                 dialysis.  Sonographer:    Alvino Chapel RCS Referring Phys: [id number] [place]  1. Left ventricular ejection fraction, by estimation, is 50 to 55%. The left ventricle has low normal function. The left ventricle has no regional wall motion abnormalities. There is severe left ventricular hypertrophy.  2. Right ventricular systolic function is normal. The right ventricular size is normal.  3. There is mild calcification of the aortic valve. There is mild thickening of the aortic valve.  4. Limited echo to evaluate LV function FINDINGS  Left Ventricle: Left ventricular ejection fraction, by estimation, is 50 to 55%. The left ventricle has low normal function. The left ventricle has no regional wall motion abnormalities. The left ventricular internal cavity size was normal in size. There is severe left ventricular hypertrophy. Right Ventricle: The right ventricular size is normal. No increase in right ventricular wall thickness. Right ventricular systolic function is normal. Pericardium: There is no evidence of pericardial effusion. Aortic Valve: There is mild calcification of the aortic valve. There is mild thickening of the aortic valve. There is mild aortic valve annular calcification. LEFT VENTRICLE PLAX 2D LVIDd:         4.74 cm LVIDs:         3.55 cm LV PW:         1.64 cm LV IVS:        1.63 cm LVOT diam:     2.10 cm LVOT Area:     3.46 cm  LV Volumes (MOD) LV vol d, MOD A2C: 131.0 ml LV vol d, MOD A4C: 147.0 ml LV vol s, MOD A2C: 72.7 ml LV vol s, MOD A4C: 79.9 ml LV SV MOD A2C:     58.3 ml LV SV MOD A4C:     147.0  ml LV SV MOD BP:      63.2 ml LEFT ATRIUM         Index LA diam:    4.55 cm 2.13 cm/m   AORTA Ao Root diam: 3.10 cm  SHUNTS Systemic Diam: 2.10 cm Carlyle Dolly MD Electronically signed by Carlyle Dolly MD Signature Date/Time: 10/17/2020/4:16:16 PM    Final      Microbiology: Recent Results (from the past 240 hour(s))  Resp Panel by RT PCR (RSV, Flu A&B, Covid) - Nasopharyngeal Swab  Status: None   Collection Time: 10/16/20 10:16 AM   Specimen: Nasopharyngeal Swab  Result Value Ref Range Status   SARS Coronavirus 2 by RT PCR NEGATIVE NEGATIVE Final    Comment: (NOTE) SARS-CoV-2 target nucleic acids are NOT DETECTED.  The SARS-CoV-2 RNA is generally detectable in upper respiratoy specimens during the acute phase of infection. The lowest concentration of SARS-CoV-2 viral copies this assay can detect is 131 copies/mL. A negative result does not preclude SARS-Cov-2 infection and should not be used as the sole basis for treatment or other patient management decisions. A negative result may occur with  improper specimen collection/handling, submission of specimen other than nasopharyngeal swab, presence of viral mutation(s) within the areas targeted by this assay, and inadequate number of viral copies (<131 copies/mL). A negative result must be combined with clinical observations, patient history, and epidemiological information. The expected result is Negative.  Fact Sheet for Patients:  PinkCheek.be  Fact Sheet for Healthcare Providers:  GravelBags.it  This test is no t yet approved or cleared by the [place] FDA and  has been authorized for detection and/or diagnosis of SARS-CoV-2 by FDA under an Emergency Use Authorization (EUA). This EUA will remain  in effect (meaning this test can be used) for the duration of the COVID-19 declaration under Section 564(b)(1) of the Act, 21 U.S.C. section 360bbb-3(b)(1), unless  the authorization is terminated or revoked sooner.     Influenza A by PCR NEGATIVE NEGATIVE Final   Influenza B by PCR NEGATIVE NEGATIVE Final    Comment: (NOTE) The Xpert Xpress SARS-CoV-2/FLU/RSV assay is intended as an aid in  the diagnosis of influenza from Nasopharyngeal swab specimens and  should not be used as a sole basis for treatment. Nasal washings and  aspirates are unacceptable for Xpert Xpress SARS-CoV-2/FLU/RSV  testing.  Fact Sheet for Patients: PinkCheek.be  Fact Sheet for Healthcare Providers: GravelBags.it  This test is not yet approved or cleared by the [place] FDA and  has been authorized for detection and/or diagnosis of SARS-CoV-2 by  FDA under an Emergency Use Authorization (EUA). This EUA will remain  in effect (meaning this test can be used) for the duration of the  Covid-19 declaration under Section 564(b)(1) of the Act, 21  U.S.C. section 360bbb-3(b)(1), unless the authorization is  terminated or revoked.    Respiratory Syncytial Virus by PCR NEGATIVE NEGATIVE Final    Comment: (NOTE) Fact Sheet for Patients: PinkCheek.be  Fact Sheet for Healthcare Providers: GravelBags.it  This test is not yet approved or cleared by the [place] FDA and  has been authorized for detection and/or diagnosis of SARS-CoV-2 by  FDA under an Emergency Use Authorization (EUA). This EUA will remain  in effect (meaning this test can be used) for the duration of the  COVID-19 declaration under Section 564(b)(1) of the Act, 21 U.S.C.  section 360bbb-3(b)(1), unless the authorization is terminated or  revoked. Performed at [facility], [address], [place], [place] [address]   Culture, blood (single)     Status: None (Preliminary result)   Collection Time: 10/17/20  8:00 PM   Specimen: Left Antecubital; Blood  Result Value Ref Range Status    Specimen Description LEFT ANTECUBITAL  Final   Special Requests   Final    BOTTLES DRAWN AEROBIC AND ANAEROBIC Blood Culture adequate volume   Culture   Final    NO GROWTH 2 DAYS Performed at [facility], [address], [place], [place] [address]    Report  Status PENDING  Incomplete     Labs: Basic Metabolic Panel: Recent Labs  Lab 10/16/20 1007 10/16/20 1007 10/16/20 1300 10/16/20 1300 10/17/20 0709 10/17/20 0709 10/18/20 0442 10/19/20 0606  NA 138  --  136  --  137  --  136 136  K 4.8   < > 4.5   < > 4.3   < > 3.8 4.2  CL 92*  --  94*  --  95*  --  94* 93*  CO2 27  --  25  --  24  --  27 26  GLUCOSE 113*  --  93  --  82  --  135* 83  BUN 39*  --  40*  --  46*  --  30* 40*  CREATININE 12.72*  --  13.00*  --  14.54*  --  10.11* 12.39*  CALCIUM 8.6*  --  8.4*  --  8.1*  --  8.4* 8.6*  MG 2.0  --   --   --  1.9  --  1.9 1.9  PHOS  --   --  8.4*  --  8.8*  --  6.0* 8.2*   < > = values in this interval not displayed.   Liver Function Tests: Recent Labs  Lab 10/16/20 1300 10/17/20 0709 10/18/20 0442 10/19/20 0606  AST  --  12* 13* 11*  ALT  --  16 14 13   ALKPHOS  --  83 84 83  BILITOT  --  0.8 0.6 0.6  PROT  --  6.9 6.9 6.9  ALBUMIN 3.8 3.1* 3.0* 3.2*   No results for input(s): LIPASE, AMYLASE in the last 168 hours. No results for input(s): AMMONIA in the last 168 hours. CBC: Recent Labs  Lab 10/16/20 1331 10/17/20 0709  WBC 9.4 7.4  NEUTROABS  --  5.1  HGB 13.2 11.5*  HCT 41.3 36.7*  MCV 87.3 87.8  PLT 307 280   Cardiac Enzymes: No results for input(s): CKTOTAL, CKMB, CKMBINDEX, TROPONINI in the last 168 hours. BNP: Invalid input(s): POCBNP CBG: Recent Labs  Lab 10/18/20 1112 10/18/20 1617 10/18/20 2132 10/19/20 0227 10/19/20 0907  GLUCAP 147* 110* 145* 117* 103*    Time coordinating discharge:  36 minutes  Signed:  Orson Eva, DO [facility] Pager: [phone] 10/19/2020, 12:28 PM

## 2020-10-19 NOTE — Progress Notes (Signed)
Pt discharged via WC to POV with all personal belongings in his possession. 

## 2020-10-19 NOTE — Progress Notes (Signed)
Telemetry reviewed, afib overall rate controlled. He is coreg 12.5mg  bid, dilt 60mg  tid, eliquis. Normal bp's on this regimen, will consolidate to dilt 180mg  daily. Patient will need to arrange f/u with his primary cardiolgoist at f/u, we will sign off  Inpatient care.   Carlyle Dolly MD

## 2020-10-19 NOTE — Progress Notes (Signed)
  [facility] Progress Note   Assessment/ Plan:   Dialysis Orders: [facility]. HLK56 kgHD Bath 2K/2.5CaTime 4:15Heparin none. AccessLUE AVFBFR 450DFR 600  Hectoral 41mcg IV/HD Epogen noneOther Sensipar 30 mg po qHD  Assessment/Plan: 1. Atrial Fibrillation with RVR- started on cardizem.   Cardiology consulted, appreciate assistance.  Dilt consolidated today per cardiology, remains on carvedilol as well.  On Eliquis 2. ESRD- HD off schedule Tues, will plan for off schedule today (Thursday) and then back to regular MWF schedule by Friday.  3. Hypertension/volume- BP variable and drops with tachycardia.Improved on diltiazem 4. Anemia- no ESA 5. Metabolic bone disease- Cont with outpatient meds. hectorol 2 mcg and sensipar 30 q rx 6. Nutrition- Renal diet, carb modified 7. DM - per primary 8. Disposition- per primary. If HR remains stable after HD, he can go from a renal perspective with resumption of regular OP schedule tomorrow.    Subjective:    Seen in room.  Eager to go home today.  Discussed that he is for HD today as well with resumption of regular schedule tomorrow (Friday).     Objective:   BP 117/72   Pulse 73   Temp 98.5 F (36.9 C) (Oral)   Resp 16   Ht 5\' 10"  (1.778 m)   Wt 94.6 kg   SpO2 97%   BMI 29.92 kg/m   Physical Exam: Gen: NAD, sitting up in bed CVS: Irregular, normal rate Resp: clear bilaterally Abd: soft Ext: trace LE edema ACCESS: LUE AVF +T/B  Labs: BMET Recent Labs  Lab 10/16/20 1007 10/16/20 1300 10/17/20 0709 10/18/20 0442 10/19/20 0606  NA 138 136 137 136 136  K 4.8 4.5 4.3 3.8 4.2  CL 92* 94* 95* 94* 93*  CO2 27 25 24 27 26   GLUCOSE 113* 93 82 135* 83  BUN 39* 40* 46* 30* 40*  CREATININE 12.72* 13.00* 14.54* 10.11* 12.39*  CALCIUM 8.6* 8.4* 8.1* 8.4* 8.6*  PHOS  --  8.4* 8.8* 6.0* 8.2*   CBC Recent Labs  Lab 10/16/20 1331 10/17/20 0709  WBC 9.4 7.4  NEUTROABS  --   5.1  HGB 13.2 11.5*  HCT 41.3 36.7*  MCV 87.3 87.8  PLT 307 280      Medications:    . apixaban  5 mg Oral BID  . aspirin EC  81 mg Oral Daily  . atorvastatin  40 mg Oral QPM  . carvedilol  12.5 mg Oral BID  . Chlorhexidine Gluconate Cloth  6 each Topical Q0600  . diltiazem  180 mg Oral Daily  . insulin aspart  0-6 Units Subcutaneous TID WC  . isosorbide mononitrate  60 mg Oral Daily  . multivitamin  1 tablet Oral Daily  . sevelamer carbonate  800 mg Oral TID with meals     Madelon Lips, MD 10/19/2020, 9:57 AM

## 2020-10-22 LAB — CULTURE, BLOOD (SINGLE)
Culture: NO GROWTH
Special Requests: ADEQUATE

## 2021-01-01 ENCOUNTER — Encounter (HOSPITAL_COMMUNITY): Payer: Self-pay | Admitting: Emergency Medicine

## 2021-01-01 ENCOUNTER — Emergency Department (HOSPITAL_COMMUNITY)
Admission: EM | Admit: 2021-01-01 | Discharge: 2021-01-01 | Disposition: A | Discharge status: Home / Self Care | Payer: Medicare Other | Attending: Emergency Medicine | Admitting: Emergency Medicine

## 2021-01-01 ENCOUNTER — Emergency Department (HOSPITAL_COMMUNITY): Payer: Medicare Other

## 2021-01-01 ENCOUNTER — Other Ambulatory Visit: Payer: Self-pay

## 2021-01-01 DIAGNOSIS — I4891 Unspecified atrial fibrillation: Secondary | ICD-10-CM | POA: Insufficient documentation

## 2021-01-01 DIAGNOSIS — F17210 Nicotine dependence, cigarettes, uncomplicated: Secondary | ICD-10-CM | POA: Insufficient documentation

## 2021-01-01 DIAGNOSIS — E1122 Type 2 diabetes mellitus with diabetic chronic kidney disease: Secondary | ICD-10-CM | POA: Diagnosis not present

## 2021-01-01 DIAGNOSIS — Z951 Presence of aortocoronary bypass graft: Secondary | ICD-10-CM | POA: Insufficient documentation

## 2021-01-01 DIAGNOSIS — I120 Hypertensive chronic kidney disease with stage 5 chronic kidney disease or end stage renal disease: Secondary | ICD-10-CM | POA: Insufficient documentation

## 2021-01-01 DIAGNOSIS — Z992 Dependence on renal dialysis: Secondary | ICD-10-CM | POA: Diagnosis not present

## 2021-01-01 DIAGNOSIS — R079 Chest pain, unspecified: Secondary | ICD-10-CM | POA: Insufficient documentation

## 2021-01-01 DIAGNOSIS — N186 End stage renal disease: Secondary | ICD-10-CM | POA: Insufficient documentation

## 2021-01-01 DIAGNOSIS — Z7901 Long term (current) use of anticoagulants: Secondary | ICD-10-CM | POA: Diagnosis not present

## 2021-01-01 DIAGNOSIS — Z7982 Long term (current) use of aspirin: Secondary | ICD-10-CM | POA: Insufficient documentation

## 2021-01-01 DIAGNOSIS — I2510 Atherosclerotic heart disease of native coronary artery without angina pectoris: Secondary | ICD-10-CM | POA: Insufficient documentation

## 2021-01-01 DIAGNOSIS — Z79899 Other long term (current) drug therapy: Secondary | ICD-10-CM | POA: Insufficient documentation

## 2021-01-01 LAB — CBC WITH DIFFERENTIAL/PLATELET
Abs Immature Granulocytes: 0.03 10*3/uL (ref 0.00–0.07)
Basophils Absolute: 0 10*3/uL (ref 0.0–0.1)
Basophils Relative: 0 %
Eosinophils Absolute: 0.1 10*3/uL (ref 0.0–0.5)
Eosinophils Relative: 2 %
HCT: 36.6 % — ABNORMAL LOW (ref 39.0–52.0)
Hemoglobin: 11.3 g/dL — ABNORMAL LOW (ref 13.0–17.0)
Immature Granulocytes: 0 %
Lymphocytes Relative: 20 %
Lymphs Abs: 1.5 10*3/uL (ref 0.7–4.0)
MCH: 28 pg (ref 26.0–34.0)
MCHC: 30.9 g/dL (ref 30.0–36.0)
MCV: 90.6 fL (ref 80.0–100.0)
Monocytes Absolute: 0.6 10*3/uL (ref 0.1–1.0)
Monocytes Relative: 8 %
Neutro Abs: 5.2 10*3/uL (ref 1.7–7.7)
Neutrophils Relative %: 70 %
Platelets: 186 10*3/uL (ref 150–400)
RBC: 4.04 MIL/uL — ABNORMAL LOW (ref 4.22–5.81)
RDW: 16.6 % — ABNORMAL HIGH (ref 11.5–15.5)
WBC: 7.4 10*3/uL (ref 4.0–10.5)
nRBC: 0 % (ref 0.0–0.2)

## 2021-01-01 LAB — COMPREHENSIVE METABOLIC PANEL WITH GFR
ALT: 13 U/L (ref 0–44)
AST: 11 U/L — ABNORMAL LOW (ref 15–41)
Albumin: 4.1 g/dL (ref 3.5–5.0)
Alkaline Phosphatase: 89 U/L (ref 38–126)
Anion gap: 18 — ABNORMAL HIGH (ref 5–15)
BUN: 59 mg/dL — ABNORMAL HIGH (ref 6–20)
CO2: 24 mmol/L (ref 22–32)
Calcium: 9.3 mg/dL (ref 8.9–10.3)
Chloride: 96 mmol/L — ABNORMAL LOW (ref 98–111)
Creatinine, Ser: 10.52 mg/dL — ABNORMAL HIGH (ref 0.61–1.24)
GFR, Estimated: 6 mL/min — ABNORMAL LOW
Glucose, Bld: 93 mg/dL (ref 70–99)
Potassium: 5.6 mmol/L — ABNORMAL HIGH (ref 3.5–5.1)
Sodium: 138 mmol/L (ref 135–145)
Total Bilirubin: 0.6 mg/dL (ref 0.3–1.2)
Total Protein: 7.6 g/dL (ref 6.5–8.1)

## 2021-01-01 LAB — MAGNESIUM: Magnesium: 1.8 mg/dL (ref 1.7–2.4)

## 2021-01-01 LAB — TROPONIN I (HIGH SENSITIVITY)
Troponin I (High Sensitivity): 26 ng/L — ABNORMAL HIGH
Troponin I (High Sensitivity): 26 ng/L — ABNORMAL HIGH (ref <18)

## 2021-01-01 MED ORDER — SODIUM ZIRCONIUM CYCLOSILICATE 5 G PO PACK
5.0000 g | PACK | Freq: Every day | ORAL | Status: DC
  Administered 2021-01-01: 5 g via ORAL
  Filled 2021-01-01: qty 1

## 2021-01-01 NOTE — Discharge Instructions (Addendum)
Please call your cardiology doctor and try to get an earlier appointment to be seen.  Please call your dialysis center today and inform them that you will need to have emergent dialysis tomorrow.  Return to the ER for any new or worsening symptoms.

## 2021-01-01 NOTE — ED Provider Notes (Signed)
[facility] EMERGENCY DEPARTMENT Provider Note   CSN: [id number] Arrival date & time: 01/01/21  0315     History Chief Complaint  Patient presents with  . Chest Pain    Shaun Burns is a 50 y.o. male.  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 fib on Eliquis presents to the ER with complaints of chest pain.  History provided by the patient and chart review.  Patient states he started to have some left-sided chest pain that radiated into his left shoulder which began yesterday around 6 PM.  Patient states he went to [place] with a friend and noticed it when he got home.  Pain waxed and waned throughout the night, he did seem to think it was worsened with exertion but also seem to occur at rest.  He describes it as sharp and stabbing.  He stated that he thought he could sleep it off, went to dialysis, but the pain continued.  He mentioned it to the dialysis center and they called EMS and sent him here.  He was given nitro via EMS.  He states he has been compliant with his Eliquis.  Has had mild nausea but no vomiting.  No episode of diaphoresis, syncope.  He is currently chest pain-free in the ER.  Reports he has had multiple "mini heart attacks".  Denies any cough, fevers or chills.  Not feeling short of breath now.     Chart review, cardiac cath performed in June 2021 demonstrated multivessel disease.  Prior history of A. fib with RVR.  Patient reports he has a cardiologist through the [facility] system, per chart review, he has seen Reginia Forts NP w/ [facility].  Has an appointment with them on January 26, 2020.  Past Medical History:  Diagnosis Date  . CAD (coronary artery disease)    a. s/p CABG in 2019 with LIMA-LAD-D1 and SVG-PDA b. abnormal NST in 05/2020 with cath showing high grade mLAD, D1 prox and after bifurcation, D2 ostial, mLCx obstructive, LIMA-D2-LAD patent, SVG-RCA  occluded, and CTO RCA with L-->R collaterals and medical management was recommended  . Chronic kidney disease   . Diabetes mellitus without complication ([place])   . GERD (gastroesophageal reflux disease)   . Headache   . Hypertension     Patient Active Problem List   Diagnosis Date Noted  . Atrial fibrillation with RVR ([place])   . Chronic atrial fibrillation with rapid ventricular response ([place]) 10/16/2020  . GERD (gastroesophageal reflux disease)   . Noncompliance with medication regimen   . Hypertension   . Type 2 diabetes mellitus ([place])   . Tobacco abuse   . S/P CABG (coronary artery bypass graft)   . CAD (coronary artery disease)   . ESRD on dialysis [facility]) 01/04/2016    Past Surgical History:  Procedure Laterality Date  . A/V FISTULAGRAM N/A 08/13/2018   Procedure: A/V FISTULAGRAM;  Surgeon: Marty Heck, MD;  Location: [place] CV LAB;  Service: Cardiovascular;  Laterality: N/A;  . A/V FISTULAGRAM N/A 01/25/2020   Procedure: A/V FISTULAGRAM - Left Arm;  Surgeon: Serafina Mitchell, MD;  Location: [place] CV LAB;  Service: Cardiovascular;  Laterality: N/A;  . AV FISTULA PLACEMENT Left 03/30/2015   Procedure: LEFT BRACHIOCEPHALIC ARTERIOVENOUS (AV) FISTULA CREATION;  Surgeon: Elam Dutch, MD;  Location: [place];  Service: Vascular;  Laterality: Left;  . KNEE SURGERY    .  PERIPHERAL VASCULAR BALLOON ANGIOPLASTY Left 08/13/2018   Procedure: PERIPHERAL VASCULAR BALLOON ANGIOPLASTY;  Surgeon: Marty Heck, MD;  Location: [place] CV LAB;  Service: Cardiovascular;  Laterality: Left;  central venous   . PERIPHERAL VASCULAR BALLOON ANGIOPLASTY Left 01/25/2020   Procedure: PERIPHERAL VASCULAR BALLOON ANGIOPLASTY;  Surgeon: Serafina Mitchell, MD;  Location: [place] CV LAB;  Service: Cardiovascular;  Laterality: Left;  arm fistula  . PERIPHERAL VASCULAR CATHETERIZATION Left 01/11/2016   Procedure: A/V Shuntogram/Fistulagram;  Surgeon: Conrad Poplar, MD;  Location: [place] CV LAB;  Service: Cardiovascular;  Laterality: Left;       Family History  Problem Relation Age of Onset  . Hypertension Father   . Stroke Father   . Diabetes Mother     Social History   Tobacco Use  . Smoking status: Current Every Day Smoker    Packs/day: 0.50    Types: Cigarettes  . Smokeless tobacco: Never Used  Substance Use Topics  . Alcohol use: No    Alcohol/week: 0.0 standard drinks  . Drug use: No    Home Medications Prior to Admission medications   Medication Sig Start Date End Date Taking? Authorizing Provider  acetaminophen (TYLENOL) 500 MG tablet Take 1,500 mg by mouth 2 (two) times daily as needed for moderate pain.     [provider]  albuterol (VENTOLIN HFA) 108 (90 Base) MCG/ACT inhaler Inhale 2 puffs into the lungs as needed.    [provider]  apixaban (ELIQUIS) 5 MG TABS tablet Take 1 tablet (5 mg total) by mouth 2 (two) times daily. 10/19/20   Orson Eva, MD  aspirin EC 81 MG tablet Take 81 mg by mouth 2 (two) times daily.  07/08/18   [provider]  atorvastatin (LIPITOR) 40 MG tablet Take 40 mg by mouth every evening.    [provider]  carvedilol (COREG) 12.5 MG tablet Take 12.5 mg by mouth 2 (two) times daily. 10/12/20   [provider]  diltiazem (CARDIZEM CD) 180 MG 24 hr capsule Take 1 capsule (180 mg total) by mouth daily. 10/19/20   Orson Eva, MD  isosorbide mononitrate (IMDUR) 60 MG 24 hr tablet Take 60 mg by mouth daily. 08/24/20   [provider]  multivitamin (RENA-VIT) TABS tablet Take 1 tablet by mouth daily. 07/15/18   [provider]  sevelamer carbonate (RENVELA) 800 MG tablet Take 800 mg by mouth 3 (three) times daily. 10/12/20   [provider]    Allergies    Patient has no known allergies.  Review of Systems   Review of Systems  Cardiovascular: Positive for chest pain.    Physical Exam Updated Vital Signs BP (!) 108/92   Pulse (!) 56   Temp  98 F (36.7 C) (Oral)   Resp 16   Ht 5\' 10"  (1.778 m)   Wt 95.3 kg   SpO2 98%   BMI 30.13 kg/m   Physical Exam  ED Results / Procedures / Treatments   Labs (all labs ordered are listed, but only abnormal results are displayed) Labs Reviewed  CBC WITH DIFFERENTIAL/PLATELET - Abnormal; Notable for the following components:      Result Value   RBC 4.04 (*)    Hemoglobin 11.3 (*)    HCT 36.6 (*)    RDW 16.6 (*)    All other components within normal limits  COMPREHENSIVE METABOLIC PANEL - Abnormal; Notable for the following components:   Potassium 5.6 (*)    Chloride  96 (*)    BUN 59 (*)    Creatinine, Ser 10.52 (*)    AST 11 (*)    GFR, Estimated 6 (*)    Anion gap 18 (*)    All other components within normal limits  TROPONIN I (HIGH SENSITIVITY) - Abnormal; Notable for the following components:   Troponin I (High Sensitivity) 26 (*)    All other components within normal limits  TROPONIN I (HIGH SENSITIVITY) - Abnormal; Notable for the following components:   Troponin I (High Sensitivity) 26 (*)    All other components within normal limits  MAGNESIUM    EKG EKG Interpretation  Date/Time:  Monday January 01 2021 09:15:16 EST Ventricular Rate:  106 PR Interval:    QRS Duration: 101 QT Interval:  335 QTC Calculation: 424 R Axis:   1 Text Interpretation: Atrial fibrillation Ventricular premature complex Consider anterior infarct Nonspecific T abnormalities, lateral leads Confirmed by Milton Ferguson [phone]) on 01/01/2021 10:24:33 AM   Radiology DG Chest Portable 1 View  Result Date: 01/01/2021 CLINICAL DATA:  Chest pain EXAM: PORTABLE CHEST 1 VIEW COMPARISON:  10/16/2020 FINDINGS: Post CABG changes. Stable cardiomegaly. No focal airspace consolidation, pleural effusion, or pneumothorax. Prominent vascular calcifications are present including atherosclerosis of the thoracic aorta. IMPRESSION: No active disease. Electronically Signed   By: Davina Poke D.O.   On:  01/01/2021 09:39    Procedures Procedures (including critical care time)  Medications Ordered in ED Medications  sodium zirconium cyclosilicate (LOKELMA) packet 5 g (5 g Oral Given 01/01/21 1306)    ED Course  I have reviewed the triage vital signs and the nursing notes.  Pertinent labs & imaging results that were available during my care of the patient were reviewed by me and considered in my medical decision making (see chart for details).    MDM Rules/Calculators/A&P                          CC: 50 year old male with complaints of left-sided chest pain Impression: On arrival, the patient is alert, oriented, nontoxic-appearing, no acute distress, nondiaphoretic, resting comfortably in the ER bed VS: Vital signs on arrival reassuring, heart rate 76, not hypotensive, hypoxic or tachypneic. PN:TIRWERX is gathered by the patient. Previous records obtained and reviewed. DDX:The patient's complaint of chest pain involves an extensive number of diagnostic and treatment options, and is a complaint that carries with it a high risk of complications, morbidity, and potential mortality. Given the large differential diagnosis, medical decision making is of high complexity.  ACS, musculoskeletal pain, PE, dissection Labs: I ordered reviewed and interpreted labs which include -CBC without leukocytosis, hemoglobin 11.3 -CMP with a potassium of 5.6, creatinine consistent with a ESRD.  Anion gap of 18.  Suspect this is secondary to the patient's dialysis status -Negative delta troponins Imaging: I ordered and reviewed images which included chest x-ray. I independently visualized and interpreted all imaging. There are no acute, significant findings on today's images. EKG: Largely unchanged from previous Consults: -Spoke with Dr. Johnsie Cancel with cardiology, with negative delta troponins, unchanged EKG, he states he is stable for follow-up with cardiology. -Spoke with Dr. Johnney Ou with nephrology, he  recommends [facility] for the patient's hyperkalemia, patient needs to call the dialysis center today and inform them that he needs to have dialysis tomorrow. MDM: Work-up overall reassuring.  No evidence of ACS.  Low suspicion for dissection, PE, patient is not short of breath, asymptomatic currently here in the ER.  No evidence of A. fib with RVR.  Patient was given strict instructions to follow-up with his cardiologist, strict instructions to have dialysis tomorrow. Patient disposition:The patient appears reasonably screened and/or stabilized for discharge and I doubt any other medical condition or other [facility] requiring further screening, evaluation, or treatment in the ED at this time prior to discharge. I have discussed lab and/or imaging findings with the patient and answered all questions/concerns to the best of my ability.I have discussed return precautions and OP follow up.  This was a shared visit with my supervising physician Dr. Roderic Palau  who independently saw and evaluated the patient & provided guidance in evaluation/management/disposition ,in agreement with care   Final Clinical Impression(s) / ED Diagnoses Final diagnoses:  Chest pain, unspecified type    Rx / DC Orders ED Discharge Orders    None       Lyndel Safe 01/01/21 1313    Milton Ferguson, MD 01/04/21 1030

## 2021-01-01 NOTE — ED Triage Notes (Signed)
Pt c/o of right sided chest pain with elevated to HR. HX of afib

## 2021-01-30 DIAGNOSIS — I219 Acute myocardial infarction, unspecified: Secondary | ICD-10-CM

## 2021-01-30 HISTORY — DX: Acute myocardial infarction, unspecified: I21.9

## 2021-09-12 ENCOUNTER — Other Ambulatory Visit: Payer: Self-pay

## 2021-09-12 ENCOUNTER — Encounter (HOSPITAL_COMMUNITY): Payer: Self-pay

## 2021-09-12 ENCOUNTER — Emergency Department (HOSPITAL_COMMUNITY): Payer: Medicare Other

## 2021-09-12 ENCOUNTER — Inpatient Hospital Stay (HOSPITAL_COMMUNITY)
Admission: EM | Admit: 2021-09-12 | Discharge: 2021-09-18 | DRG: 811 | Disposition: A | Discharge status: Home / Self Care | Payer: Medicare Other | Source: Ambulatory Visit | Attending: Family Medicine | Admitting: Family Medicine

## 2021-09-12 DIAGNOSIS — R072 Precordial pain: Secondary | ICD-10-CM | POA: Diagnosis present

## 2021-09-12 DIAGNOSIS — I4821 Permanent atrial fibrillation: Secondary | ICD-10-CM | POA: Diagnosis present

## 2021-09-12 DIAGNOSIS — I2582 Chronic total occlusion of coronary artery: Secondary | ICD-10-CM | POA: Diagnosis present

## 2021-09-12 DIAGNOSIS — Z20822 Contact with and (suspected) exposure to covid-19: Secondary | ICD-10-CM | POA: Diagnosis present

## 2021-09-12 DIAGNOSIS — Z9114 Patient's other noncompliance with medication regimen: Secondary | ICD-10-CM

## 2021-09-12 DIAGNOSIS — Z79899 Other long term (current) drug therapy: Secondary | ICD-10-CM

## 2021-09-12 DIAGNOSIS — I200 Unstable angina: Secondary | ICD-10-CM | POA: Diagnosis not present

## 2021-09-12 DIAGNOSIS — K3189 Other diseases of stomach and duodenum: Secondary | ICD-10-CM | POA: Diagnosis not present

## 2021-09-12 DIAGNOSIS — Z951 Presence of aortocoronary bypass graft: Secondary | ICD-10-CM

## 2021-09-12 DIAGNOSIS — R55 Syncope and collapse: Secondary | ICD-10-CM | POA: Diagnosis present

## 2021-09-12 DIAGNOSIS — R778 Other specified abnormalities of plasma proteins: Secondary | ICD-10-CM | POA: Diagnosis present

## 2021-09-12 DIAGNOSIS — I2510 Atherosclerotic heart disease of native coronary artery without angina pectoris: Secondary | ICD-10-CM | POA: Diagnosis not present

## 2021-09-12 DIAGNOSIS — I255 Ischemic cardiomyopathy: Secondary | ICD-10-CM | POA: Diagnosis not present

## 2021-09-12 DIAGNOSIS — I25810 Atherosclerosis of coronary artery bypass graft(s) without angina pectoris: Secondary | ICD-10-CM | POA: Diagnosis not present

## 2021-09-12 DIAGNOSIS — R9431 Abnormal electrocardiogram [ECG] [EKG]: Secondary | ICD-10-CM | POA: Diagnosis present

## 2021-09-12 DIAGNOSIS — R079 Chest pain, unspecified: Secondary | ICD-10-CM | POA: Diagnosis present

## 2021-09-12 DIAGNOSIS — F17210 Nicotine dependence, cigarettes, uncomplicated: Secondary | ICD-10-CM | POA: Diagnosis present

## 2021-09-12 DIAGNOSIS — N186 End stage renal disease: Secondary | ICD-10-CM | POA: Diagnosis present

## 2021-09-12 DIAGNOSIS — Z23 Encounter for immunization: Secondary | ICD-10-CM | POA: Diagnosis present

## 2021-09-12 DIAGNOSIS — I5022 Chronic systolic (congestive) heart failure: Secondary | ICD-10-CM | POA: Diagnosis present

## 2021-09-12 DIAGNOSIS — Z8616 Personal history of COVID-19: Secondary | ICD-10-CM

## 2021-09-12 DIAGNOSIS — I482 Chronic atrial fibrillation, unspecified: Secondary | ICD-10-CM | POA: Diagnosis not present

## 2021-09-12 DIAGNOSIS — D649 Anemia, unspecified: Secondary | ICD-10-CM

## 2021-09-12 DIAGNOSIS — Z720 Tobacco use: Secondary | ICD-10-CM | POA: Diagnosis not present

## 2021-09-12 DIAGNOSIS — E119 Type 2 diabetes mellitus without complications: Secondary | ICD-10-CM

## 2021-09-12 DIAGNOSIS — D631 Anemia in chronic kidney disease: Secondary | ICD-10-CM | POA: Diagnosis present

## 2021-09-12 DIAGNOSIS — E785 Hyperlipidemia, unspecified: Secondary | ICD-10-CM | POA: Diagnosis present

## 2021-09-12 DIAGNOSIS — I10 Essential (primary) hypertension: Secondary | ICD-10-CM | POA: Diagnosis present

## 2021-09-12 DIAGNOSIS — K319 Disease of stomach and duodenum, unspecified: Secondary | ICD-10-CM | POA: Diagnosis present

## 2021-09-12 DIAGNOSIS — I25710 Atherosclerosis of autologous vein coronary artery bypass graft(s) with unstable angina pectoris: Secondary | ICD-10-CM | POA: Diagnosis present

## 2021-09-12 DIAGNOSIS — Z992 Dependence on renal dialysis: Secondary | ICD-10-CM

## 2021-09-12 DIAGNOSIS — Z95810 Presence of automatic (implantable) cardiac defibrillator: Secondary | ICD-10-CM | POA: Diagnosis not present

## 2021-09-12 DIAGNOSIS — I493 Ventricular premature depolarization: Secondary | ICD-10-CM | POA: Diagnosis present

## 2021-09-12 DIAGNOSIS — I252 Old myocardial infarction: Secondary | ICD-10-CM

## 2021-09-12 DIAGNOSIS — D472 Monoclonal gammopathy: Secondary | ICD-10-CM | POA: Diagnosis present

## 2021-09-12 DIAGNOSIS — I9589 Other hypotension: Secondary | ICD-10-CM | POA: Diagnosis present

## 2021-09-12 DIAGNOSIS — E1122 Type 2 diabetes mellitus with diabetic chronic kidney disease: Secondary | ICD-10-CM | POA: Diagnosis present

## 2021-09-12 DIAGNOSIS — Z833 Family history of diabetes mellitus: Secondary | ICD-10-CM

## 2021-09-12 DIAGNOSIS — D6489 Other specified anemias: Secondary | ICD-10-CM

## 2021-09-12 DIAGNOSIS — R34 Anuria and oliguria: Secondary | ICD-10-CM | POA: Diagnosis present

## 2021-09-12 DIAGNOSIS — I21A1 Myocardial infarction type 2: Secondary | ICD-10-CM | POA: Diagnosis present

## 2021-09-12 DIAGNOSIS — I25110 Atherosclerotic heart disease of native coronary artery with unstable angina pectoris: Secondary | ICD-10-CM | POA: Diagnosis present

## 2021-09-12 DIAGNOSIS — Z7982 Long term (current) use of aspirin: Secondary | ICD-10-CM

## 2021-09-12 DIAGNOSIS — Z8249 Family history of ischemic heart disease and other diseases of the circulatory system: Secondary | ICD-10-CM

## 2021-09-12 DIAGNOSIS — I132 Hypertensive heart and chronic kidney disease with heart failure and with stage 5 chronic kidney disease, or end stage renal disease: Secondary | ICD-10-CM | POA: Diagnosis present

## 2021-09-12 DIAGNOSIS — Z7901 Long term (current) use of anticoagulants: Secondary | ICD-10-CM | POA: Diagnosis not present

## 2021-09-12 DIAGNOSIS — Z5320 Procedure and treatment not carried out because of patient's decision for unspecified reasons: Secondary | ICD-10-CM

## 2021-09-12 DIAGNOSIS — N189 Chronic kidney disease, unspecified: Secondary | ICD-10-CM | POA: Diagnosis present

## 2021-09-12 DIAGNOSIS — I208 Other forms of angina pectoris: Secondary | ICD-10-CM | POA: Diagnosis not present

## 2021-09-12 DIAGNOSIS — Z635 Disruption of family by separation and divorce: Secondary | ICD-10-CM

## 2021-09-12 DIAGNOSIS — D62 Acute posthemorrhagic anemia: Principal | ICD-10-CM | POA: Diagnosis present

## 2021-09-12 DIAGNOSIS — E871 Hypo-osmolality and hyponatremia: Secondary | ICD-10-CM | POA: Diagnosis present

## 2021-09-12 DIAGNOSIS — Z8719 Personal history of other diseases of the digestive system: Secondary | ICD-10-CM

## 2021-09-12 DIAGNOSIS — K219 Gastro-esophageal reflux disease without esophagitis: Secondary | ICD-10-CM | POA: Diagnosis present

## 2021-09-12 DIAGNOSIS — I953 Hypotension of hemodialysis: Secondary | ICD-10-CM | POA: Diagnosis present

## 2021-09-12 DIAGNOSIS — I214 Non-ST elevation (NSTEMI) myocardial infarction: Secondary | ICD-10-CM | POA: Diagnosis present

## 2021-09-12 DIAGNOSIS — I4819 Other persistent atrial fibrillation: Secondary | ICD-10-CM | POA: Diagnosis not present

## 2021-09-12 DIAGNOSIS — M898X9 Other specified disorders of bone, unspecified site: Secondary | ICD-10-CM | POA: Diagnosis present

## 2021-09-12 DIAGNOSIS — R195 Other fecal abnormalities: Secondary | ICD-10-CM | POA: Diagnosis present

## 2021-09-12 DIAGNOSIS — Z823 Family history of stroke: Secondary | ICD-10-CM

## 2021-09-12 DIAGNOSIS — I472 Ventricular tachycardia: Secondary | ICD-10-CM | POA: Diagnosis present

## 2021-09-12 HISTORY — DX: Ventricular tachycardia: I47.2

## 2021-09-12 HISTORY — DX: Type 2 diabetes mellitus without complications: E11.9

## 2021-09-12 HISTORY — DX: Essential (primary) hypertension: I10

## 2021-09-12 HISTORY — DX: Personal history of other diseases of the digestive system: Z87.19

## 2021-09-12 HISTORY — DX: Dependence on renal dialysis: N18.6

## 2021-09-12 HISTORY — DX: Ventricular tachycardia, unspecified: I47.20

## 2021-09-12 HISTORY — DX: Dependence on renal dialysis: Z99.2

## 2021-09-12 HISTORY — DX: Ischemic cardiomyopathy: I25.5

## 2021-09-12 LAB — HEMOGLOBIN A1C
Hgb A1c MFr Bld: 5.7 % — ABNORMAL HIGH (ref 4.8–5.6)
Mean Plasma Glucose: 116.89 mg/dL

## 2021-09-12 LAB — BASIC METABOLIC PANEL
Anion gap: 15 (ref 5–15)
BUN: 70 mg/dL — ABNORMAL HIGH (ref 6–20)
CO2: 27 mmol/L (ref 22–32)
Calcium: 9.3 mg/dL (ref 8.9–10.3)
Chloride: 95 mmol/L — ABNORMAL LOW (ref 98–111)
Creatinine, Ser: 11.76 mg/dL — ABNORMAL HIGH (ref 0.61–1.24)
GFR, Estimated: 5 mL/min — ABNORMAL LOW (ref >60)
Glucose, Bld: 119 mg/dL — ABNORMAL HIGH (ref 70–99)
Potassium: 5 mmol/L (ref 3.5–5.1)
Sodium: 137 mmol/L (ref 135–145)

## 2021-09-12 LAB — VITAMIN B12: Vitamin B-12: 329 pg/mL (ref 180–914)

## 2021-09-12 LAB — CBC
HCT: 24.6 % — ABNORMAL LOW (ref 39.0–52.0)
Hemoglobin: 7.6 g/dL — ABNORMAL LOW (ref 13.0–17.0)
MCH: 30.4 pg (ref 26.0–34.0)
MCHC: 30.9 g/dL (ref 30.0–36.0)
MCV: 98.4 fL (ref 80.0–100.0)
Platelets: 188 10*3/uL (ref 150–400)
RBC: 2.5 MIL/uL — ABNORMAL LOW (ref 4.22–5.81)
RDW: 18.3 % — ABNORMAL HIGH (ref 11.5–15.5)
WBC: 8.1 10*3/uL (ref 4.0–10.5)
nRBC: 0 % (ref 0.0–0.2)

## 2021-09-12 LAB — IRON AND TIBC
Iron: 99 ug/dL (ref 45–182)
Saturation Ratios: 40 % — ABNORMAL HIGH (ref 17.9–39.5)
TIBC: 246 ug/dL — ABNORMAL LOW (ref 250–450)
UIBC: 147 ug/dL

## 2021-09-12 LAB — TROPONIN I (HIGH SENSITIVITY)
Troponin I (High Sensitivity): 98 ng/L — ABNORMAL HIGH (ref <18)
Troponin I (High Sensitivity): 120 ng/L
Troponin I (High Sensitivity): 326 ng/L (ref <18)
Troponin I (High Sensitivity): 458 ng/L (ref <18)
Troponin I (High Sensitivity): 537 ng/L (ref <18)

## 2021-09-12 LAB — RETICULOCYTES
Immature Retic Fract: 34.1 % — ABNORMAL HIGH (ref 2.3–15.9)
RBC.: 2.42 MIL/uL — ABNORMAL LOW (ref 4.22–5.81)
Retic Count, Absolute: 77.2 10*3/uL (ref 19.0–186.0)
Retic Ct Pct: 3.2 % — ABNORMAL HIGH (ref 0.4–3.1)

## 2021-09-12 LAB — RESP PANEL BY RT-PCR (FLU A&B, COVID) ARPGX2
Influenza A by PCR: NEGATIVE
Influenza B by PCR: NEGATIVE
SARS Coronavirus 2 by RT PCR: NEGATIVE

## 2021-09-12 LAB — ABO/RH: ABO/RH(D): AB POS

## 2021-09-12 LAB — PREPARE RBC (CROSSMATCH)

## 2021-09-12 LAB — CBG MONITORING, ED: Glucose-Capillary: 73 mg/dL (ref 70–99)

## 2021-09-12 LAB — HEPATITIS B SURFACE ANTIGEN: Hepatitis B Surface Ag: NONREACTIVE

## 2021-09-12 LAB — FERRITIN: Ferritin: 640 ng/mL — ABNORMAL HIGH (ref 24–336)

## 2021-09-12 LAB — FOLATE: Folate: 6.4 ng/mL (ref >5.9)

## 2021-09-12 MED ORDER — NITROGLYCERIN 2 % TD OINT
0.5000 [in_us] | TOPICAL_OINTMENT | Freq: Three times a day (TID) | TRANSDERMAL | Status: DC
  Administered 2021-09-12 – 2021-09-13 (×3): 0.5 [in_us] via TOPICAL
  Filled 2021-09-12 (×3): qty 1

## 2021-09-12 MED ORDER — ASPIRIN EC 81 MG PO TBEC
81.0000 mg | DELAYED_RELEASE_TABLET | Freq: Two times a day (BID) | ORAL | Status: DC
  Administered 2021-09-12 – 2021-09-13 (×4): 81 mg via ORAL
  Filled 2021-09-12 (×4): qty 1

## 2021-09-12 MED ORDER — BISACODYL 5 MG PO TBEC: 5.0000 mg | DELAYED_RELEASE_TABLET | Freq: Every day | ORAL | Status: DC | PRN

## 2021-09-12 MED ORDER — METOPROLOL SUCCINATE ER 50 MG PO TB24
50.0000 mg | ORAL_TABLET | Freq: Every day | ORAL | Status: DC
  Administered 2021-09-13 – 2021-09-18 (×5): 50 mg via ORAL
  Filled 2021-09-12 (×5): qty 1

## 2021-09-12 MED ORDER — LIDOCAINE HCL (PF) 1 % IJ SOLN: 5.0000 mL | INTRAMUSCULAR | Status: DC | PRN

## 2021-09-12 MED ORDER — SODIUM CHLORIDE 0.9 % IV SOLN: 100.0000 mL | INTRAVENOUS | Status: DC | PRN

## 2021-09-12 MED ORDER — INFLUENZA VAC SPLIT QUAD 0.5 ML IM SUSY
0.5000 mL | PREFILLED_SYRINGE | INTRAMUSCULAR | Status: AC
  Administered 2021-09-17: 0.5 mL via INTRAMUSCULAR
  Filled 2021-09-12: qty 0.5

## 2021-09-12 MED ORDER — INSULIN ASPART 100 UNIT/ML IJ SOLN: 0.0000 [IU] | Freq: Three times a day (TID) | INTRAMUSCULAR | Status: DC

## 2021-09-12 MED ORDER — ALBUTEROL SULFATE HFA 108 (90 BASE) MCG/ACT IN AERS
2.0000 | INHALATION_SPRAY | RESPIRATORY_TRACT | Status: DC | PRN
  Filled 2021-09-12: qty 6.7

## 2021-09-12 MED ORDER — LIDOCAINE-PRILOCAINE 2.5-2.5 % EX CREA
1.0000 "application " | TOPICAL_CREAM | CUTANEOUS | Status: DC | PRN
  Filled 2021-09-12: qty 5

## 2021-09-12 MED ORDER — ALBUMIN HUMAN 25 % IV SOLN
25.0000 g | Freq: Once | INTRAVENOUS | Status: AC
  Administered 2021-09-12: 25 g via INTRAVENOUS
  Filled 2021-09-12: qty 100

## 2021-09-12 MED ORDER — PANTOPRAZOLE SODIUM 40 MG PO TBEC
40.0000 mg | DELAYED_RELEASE_TABLET | Freq: Every day | ORAL | Status: DC
  Administered 2021-09-12: 40 mg via ORAL
  Filled 2021-09-12: qty 1

## 2021-09-12 MED ORDER — ACETAMINOPHEN 325 MG PO TABS
650.0000 mg | ORAL_TABLET | Freq: Four times a day (QID) | ORAL | Status: DC | PRN
  Administered 2021-09-16: 650 mg via ORAL
  Filled 2021-09-12: qty 2

## 2021-09-12 MED ORDER — ONDANSETRON HCL 4 MG/2ML IJ SOLN: 4.0000 mg | Freq: Four times a day (QID) | INTRAMUSCULAR | Status: DC | PRN

## 2021-09-12 MED ORDER — ATORVASTATIN CALCIUM 40 MG PO TABS
40.0000 mg | ORAL_TABLET | Freq: Every evening | ORAL | Status: DC
  Administered 2021-09-13 – 2021-09-17 (×5): 40 mg via ORAL
  Filled 2021-09-12 (×6): qty 1

## 2021-09-12 MED ORDER — CHLORHEXIDINE GLUCONATE CLOTH 2 % EX PADS
6.0000 | MEDICATED_PAD | Freq: Every day | CUTANEOUS | Status: DC
  Administered 2021-09-13 – 2021-09-16 (×3): 6 via TOPICAL

## 2021-09-12 MED ORDER — AMIODARONE HCL 200 MG PO TABS
200.0000 mg | ORAL_TABLET | Freq: Every day | ORAL | Status: DC
  Administered 2021-09-12 – 2021-09-18 (×6): 200 mg via ORAL
  Filled 2021-09-12 (×6): qty 1

## 2021-09-12 MED ORDER — HEPARIN (PORCINE) 25000 UT/250ML-% IV SOLN
900.0000 [IU]/h | INTRAVENOUS | Status: DC
  Administered 2021-09-12: 1100 [IU]/h via INTRAVENOUS
  Filled 2021-09-12: qty 250

## 2021-09-12 MED ORDER — PANTOPRAZOLE SODIUM 40 MG PO TBEC
40.0000 mg | DELAYED_RELEASE_TABLET | Freq: Two times a day (BID) | ORAL | Status: DC
  Administered 2021-09-13 – 2021-09-18 (×9): 40 mg via ORAL
  Filled 2021-09-12 (×8): qty 1

## 2021-09-12 MED ORDER — FENTANYL CITRATE PF 50 MCG/ML IJ SOSY
25.0000 ug | PREFILLED_SYRINGE | INTRAMUSCULAR | Status: DC | PRN
Start: 2021-09-12 — End: 2021-09-18
  Filled 2021-09-12: qty 1

## 2021-09-12 MED ORDER — NICOTINE 14 MG/24HR TD PT24: 14.0000 mg | MEDICATED_PATCH | Freq: Every day | TRANSDERMAL | Status: DC | PRN

## 2021-09-12 MED ORDER — NITROGLYCERIN 0.4 MG SL SUBL
0.4000 mg | SUBLINGUAL_TABLET | SUBLINGUAL | Status: DC | PRN
Start: 2021-09-12 — End: 2021-09-18
  Administered 2021-09-16: 0.4 mg via SUBLINGUAL
  Filled 2021-09-12: qty 1

## 2021-09-12 MED ORDER — SODIUM CHLORIDE 0.9 % IV SOLN
10.0000 mL/h | Freq: Once | INTRAVENOUS | Status: AC
  Administered 2021-09-12: 10 mL/h via INTRAVENOUS

## 2021-09-12 MED ORDER — PENTAFLUOROPROP-TETRAFLUOROETH EX AERO
1.0000 "application " | INHALATION_SPRAY | CUTANEOUS | Status: DC | PRN
  Filled 2021-09-12: qty 30

## 2021-09-12 MED ORDER — ONDANSETRON HCL 4 MG PO TABS: 4.0000 mg | ORAL_TABLET | Freq: Four times a day (QID) | ORAL | Status: DC | PRN

## 2021-09-12 MED ORDER — ACETAMINOPHEN 650 MG RE SUPP: 650.0000 mg | Freq: Four times a day (QID) | RECTAL | Status: DC | PRN

## 2021-09-12 MED ORDER — SEVELAMER CARBONATE 800 MG PO TABS
800.0000 mg | ORAL_TABLET | Freq: Three times a day (TID) | ORAL | Status: DC
  Administered 2021-09-13 – 2021-09-18 (×13): 800 mg via ORAL
  Filled 2021-09-12 (×13): qty 1

## 2021-09-12 NOTE — ED Triage Notes (Signed)
Pt went to [facility] and c/o chest pain and nurse sent pt here for eval. Pt has had chest pain for weeks and has been hurting on his left side chest to his left shoulder x 1 week.

## 2021-09-12 NOTE — Progress Notes (Addendum)
RN paged re admission status. Patient was admitted to 300, but is hypotensive after dialysis and no longer appropriate for 300. Chart reviewed: Patient was initially supposed to transfer to [facility], but no beds were avail, so patient was kept here at [facility] to expedite inpatient care. Troponins have increased from 98 to 400s. Dr. Wynetta Emery started heparin. Patient did receive 1 unit of blood in dialysis. It is also reported that patient was not a candidate for PCI when last admitted in Feb at [facility]. Patient is chest pain free, chatting on the phone. Admit to stepdown order placed. Stat EKG ordered- in the setting of rising trops, and will continue to follow.

## 2021-09-12 NOTE — H&P (Addendum)
History and Physical  Ritchey MGN:[id number] DOB: Oct 04, 1971 DOA: 09/12/2021  PCP: Briant Sites, PA-C  Patient coming from: Outpatient dialysis center  Level of care: Telemetry  I have personally briefly reviewed patient's old medical records in [place]  Chief Complaint: chest pain   HPI: Shaun Burns is a 50 y.o. male with medical history significant for CAD, he had a CABG in 2019, ICD placement, graft occlusion, end-stage renal disease on hemodialysis, type 2 diabetes mellitus, tobacco, hypertension, poor compliance history, chronic atrial fibrillation, fully anticoagulated on apixaban.  He presented to the emergency department complaining of ongoing chest pain that he has had for at least the last week.  He reports that the pain is worsened over the past 24 hours.  He is a poor historian.  He describes numbness over the central anterior chest wall that he describes as similar to when he had ischemic chest pain in the past.  He denies fever chills cough.  No known sick contacts.  He reports that the chest pain affected him sleeping last night.  He reported the symptoms to his dialysis center and they sent him to the emergency department for more evaluation.  He denies any radiation of pain.  He denies abdominal pain nausea vomiting.  He denies having black stools.  ED Course: His hemoglobin noted to be down to 7.6 from 11.  He refused a rectal exam for guaiac testing.  His sodium was noted to be 137, potassium 5.0, chloride 95, glucose 119, creatinine 11.76, high-sensitivity troponin 98, hemoglobin 7.6, hematocrit 24.6, platelet count 188.  His EKG had no acute findings.  He tested a be positive.  His EKG showed ST depression in the lateral leads.  He also had rate controlled atrial fibrillation.  Nephrology was consulted as he is due for hemodialysis today.  Cardiology was consulted and was concerned that he should go to [facility] however no bed is  available at this time he will be admitted to [facility] for observation serial high-sensitivity troponin testing and inpatient cardiology consultation.  Review of Systems: Review of Systems  Constitutional: Negative.   HENT: Negative.    Eyes: Negative.   Respiratory: Negative.    Cardiovascular:  Positive for chest pain. Negative for palpitations, orthopnea, claudication, leg swelling and PND.  Gastrointestinal: Negative.   Genitourinary: Negative.   Musculoskeletal: Negative.   Skin: Negative.   Neurological: Negative.   Endo/Heme/Allergies: Negative.   Psychiatric/Behavioral: Negative.    All other systems reviewed and are negative.   Past Medical History:  Diagnosis Date   CAD (coronary artery disease)    a. s/p CABG in 2019 with LIMA-LAD-D1 and SVG-PDA b. abnormal NST in 05/2020 with cath showing high grade mLAD, D1 prox and after bifurcation, D2 ostial, mLCx obstructive, LIMA-D2-LAD patent, SVG-RCA occluded, and CTO RCA with L-->R collaterals and medical management was recommended   Chronic kidney disease    Diabetes mellitus without complication (HCC)    GERD (gastroesophageal reflux disease)    Headache    Hypertension     Past Surgical History:  Procedure Laterality Date   A/V FISTULAGRAM N/A 08/13/2018   Procedure: A/V FISTULAGRAM;  Surgeon: Marty Heck, MD;  Location: [place] [facility];  Service: Cardiovascular;  Laterality: N/A;   A/V FISTULAGRAM N/A 01/25/2020   Procedure: A/V FISTULAGRAM - Left Arm;  Surgeon: Serafina Mitchell, MD;  Location: [place] [facility];  Service: Cardiovascular;  Laterality: N/A;  AV FISTULA PLACEMENT Left 03/30/2015   Procedure: LEFT BRACHIOCEPHALIC ARTERIOVENOUS (AV) FISTULA CREATION;  Surgeon: Elam Dutch, MD;  Location: [place];  Service: Vascular;  Laterality: Left;   KNEE SURGERY     PERIPHERAL VASCULAR BALLOON ANGIOPLASTY Left 08/13/2018   Procedure: PERIPHERAL VASCULAR BALLOON ANGIOPLASTY;  Surgeon: Marty Heck, MD;  Location: [place] [facility];  Service: Cardiovascular;  Laterality: Left;  central venous    PERIPHERAL VASCULAR BALLOON ANGIOPLASTY Left 01/25/2020   Procedure: PERIPHERAL VASCULAR BALLOON ANGIOPLASTY;  Surgeon: Serafina Mitchell, MD;  Location: [place] [facility];  Service: Cardiovascular;  Laterality: Left;  arm fistula   PERIPHERAL VASCULAR CATHETERIZATION Left 01/11/2016   Procedure: A/V Shuntogram/Fistulagram;  Surgeon: Conrad Dwight, MD;  Location: [place] [facility];  Service: Cardiovascular;  Laterality: Left;     reports that he has been smoking cigarettes. He has been smoking an average of .5 packs per day. He has never used smokeless tobacco. He reports that he does not drink alcohol and does not use drugs.  No Known Allergies  Family History  Problem Relation Age of Onset   Hypertension Father    Stroke Father    Diabetes Mother     Prior to Admission medications   Medication Sig Start Date End Date Taking? Authorizing Provider  acetaminophen (TYLENOL) 500 MG tablet Take 1,500 mg by mouth 2 (two) times daily as needed for moderate pain.    Yes [provider]  albuterol (VENTOLIN HFA) 108 (90 Base) MCG/ACT inhaler Inhale 2 puffs into the lungs as needed for wheezing or shortness of breath.   Yes [provider]  amiodarone (PACERONE) 200 MG tablet Take 200 mg by mouth daily.   Yes [provider]  apixaban (ELIQUIS) 5 MG TABS tablet Take 1 tablet (5 mg total) by mouth 2 (two) times daily. Patient taking differently: Take 2.5 mg by mouth 2 (two) times daily. 10/19/20  Yes TatShanon Brow, MD  aspirin EC 81 MG tablet Take 81 mg by mouth 2 (two) times daily.  07/08/18  Yes [provider]  atorvastatin (LIPITOR) 40 MG tablet Take 40 mg by mouth every evening.   Yes [provider]  metoprolol succinate (TOPROL-XL) 50 MG 24 hr tablet Take 50 mg by mouth daily. 07/22/21  Yes [provider]  sevelamer carbonate  (RENVELA) 800 MG tablet Take 800 mg by mouth in the morning and at bedtime. 10/12/20  Yes [provider]  carvedilol (COREG) 12.5 MG tablet Take 12.5 mg by mouth daily at 6 (six) AM. Patient not taking: Reported on 09/12/2021 10/12/20   [provider]  diltiazem (CARDIZEM CD) 180 MG 24 hr capsule Take 1 capsule (180 mg total) by mouth daily. Patient not taking: No sig reported 10/19/20   Orson Eva, MD  isosorbide mononitrate (IMDUR) 60 MG 24 hr tablet Take 60 mg by mouth daily. Patient not taking: Reported on 09/12/2021 08/24/20   [provider]    Physical Exam: Vitals:   09/12/21 0900 09/12/21 0930 09/12/21 1000 09/12/21 1030  BP: (!) 120/96 128/86 119/89 115/74  Pulse:      Resp: 20 19 18  (!) 22  Temp:      TempSrc:      SpO2: 98% 99% 100%   Weight:      Height:        Constitutional: awake, alert, NAD, calm, comfortable Eyes: PERRL, lids and conjunctivae normal ENMT: Mucous membranes are moist. Posterior pharynx clear of  any exudate or lesions.Normal dentition.  Neck: normal, supple, no masses, no thyromegaly Respiratory: clear to auscultation bilaterally, no wheezing, no crackles. Normal respiratory effort. No accessory muscle use.  Cardiovascular: normal s1, s2 sounds, no murmurs / rubs / gallops. No extremity edema. 2+ pedal pulses. No carotid bruits.  Abdomen: no tenderness, no masses palpated. No hepatosplenomegaly. Bowel sounds positive.  Musculoskeletal: no clubbing / cyanosis. No joint deformity upper and lower extremities. Good ROM, no contractures. Normal muscle tone.  Skin: no rashes, lesions, ulcers. No induration Neurologic: CN 2-12 grossly intact. Sensation intact, DTR normal. Strength 5/5 in all 4.  Psychiatric: Poor judgment and insight. Alert and oriented x 3. Flat mood.   Labs on Admission: I have personally reviewed following labs and imaging studies  CBC: Recent Labs  Lab 09/12/21 0903  WBC 8.1  HGB 7.6*  HCT 24.6*  MCV  98.4  PLT 161   Basic Metabolic Panel: Recent Labs  Lab 09/12/21 0903  NA 137  K 5.0  CL 95*  CO2 27  GLUCOSE 119*  BUN 70*  CREATININE 11.76*  CALCIUM 9.3   GFR: Estimated Creatinine Clearance: 8.7 mL/min (A) (by C-G formula based on SCr of 11.76 mg/dL (H)). Liver Function Tests: No results for input(s): AST, ALT, ALKPHOS, BILITOT, PROT, ALBUMIN in the last 168 hours. No results for input(s): LIPASE, AMYLASE in the last 168 hours. No results for input(s): AMMONIA in the last 168 hours. Coagulation Profile: No results for input(s): INR, PROTIME in the last 168 hours. Cardiac Enzymes: No results for input(s): CKTOTAL, CKMB, CKMBINDEX, TROPONINI in the last 168 hours. BNP (last 3 results) No results for input(s): PROBNP in the last 8760 hours. HbA1C: No results for input(s): HGBA1C in the last 72 hours. CBG: No results for input(s): GLUCAP in the last 168 hours. Lipid Profile: No results for input(s): CHOL, HDL, LDLCALC, TRIG, CHOLHDL, LDLDIRECT in the last 72 hours. Thyroid Function Tests: No results for input(s): TSH, T4TOTAL, FREET4, T3FREE, THYROIDAB in the last 72 hours. Anemia Panel: Recent Labs    09/12/21 1006  RETICCTPCT 3.2*   Urine analysis: No results found for: COLORURINE, APPEARANCEUR, LABSPEC, PHURINE, GLUCOSEU, HGBUR, BILIRUBINUR, KETONESUR, PROTEINUR, UROBILINOGEN, NITRITE, LEUKOCYTESUR  Radiological Exams on Admission: DG Chest 2 View  Result Date: 09/12/2021 CLINICAL DATA:  Chest pain. EXAM: CHEST - 2 VIEW COMPARISON:  02/10/2021 FINDINGS: Two views of the chest demonstrate a right chest single lead ICD. Heart size is within normal limits. Both lungs are clear without pulmonary edema or focal airspace disease. No pleural effusions. Prior median sternotomy. IMPRESSION: 1. No acute cardiopulmonary disease. Electronically Signed   By: Markus Daft M.D.   On: 09/12/2021 09:56    EKG: Independently reviewed.  Rate controlled atrial fibrillation, ST  depression in the lateral leads noted  Assessment/Plan Active Problems:   Abnormal electrocardiogram (ECG) (EKG)   ESRD on dialysis (HCC)   Chronic atrial fibrillation (HCC)   Noncompliance with medication regimen   Hypertension   Type 2 diabetes mellitus (HCC)   Tobacco abuse   S/P CABG (coronary artery bypass graft)   CAD (coronary artery disease)   Chest pain   Elevated troponin   Anemia in chronic kidney disease (CKD)   Refuses rectal examination for guiac testing    Chest pain with known CAD -patient has ongoing chest pain symptoms with slightly elevated high-sensitivity troponin and EKG changes suggesting some ischemia.  Patient will be admitted for further observation with serial troponin testing and inpatient cardiology consultation  which we have arranged.  Nitroglycerin as needed.  If cath need is determined will need to transfer to [facility] however no beds at this time we will keep at [facility] for now.  Telemetry bed.  Check fasting lipids.  Resume home meds when reconciled.  No heparin drip for now with concern for possible GI bleed.   End-stage renal disease on hemodialysis-nephrology consulted and planning for hemodialysis later today.  Anemia in CKD-hemoglobin down to 7.6, decision was made to transfuse 1 unit PRBC in ED slowly.  Patient is refusing Hemoccult testing refusing rectal examination.  He has history of upper GI bleed and will consult GI to see if he needs inpatient EGD.  Protonix ordered for GI protection.    Type 2 diabetes mellitus with renal complications-sensitive SSI coverage and frequent CBG monitoring ordered.  Check hemoglobin A1c.  Essential hypertension-resume home medications when fully reconciled by pharmacy.  Chronic atrial fibrillation-currently rate controlled and fully anticoagulated on apixaban. Per cardiology team, hold apixaban for now.     DVT prophylaxis: SQ heparin Code Status: Full Family Communication: Plan of care discussed with  patient at bedside who verbalized understanding Disposition Plan: Anticipate home when medically stable Consults called: Nephrology and cardiology Admission status: OBV Level of care: Telemetry Irwin Brakeman MD [facility] How to contact the [facility] Attending or Consulting provider [place] or covering provider during after hours [place], for this patient?  Check the care team in [facility] and look for a) attending/consulting [facility] provider listed and b) the [facility] team listed Log into [email or web address] and use [place]'s universal password to access. If you do not have the password, please contact the hospital operator. Locate the [facility] provider you are looking for under [facility] and page to a number that you can be directly reached. If you still have difficulty reaching the provider, please page the [facility] (Director on Call) for the Hospitalists listed on amion for assistance.   If 7PM-7AM, please contact night-coverage [email or web address] Password [facility]  09/12/2021, 10:51 AM

## 2021-09-12 NOTE — ED Notes (Signed)
Date and time results received: 09/12/21 1819   Test: troponin  Critical Value: 326  Name of Provider Notified: Dr. Wynetta Emery  Orders Received? Or Actions Taken?: notified

## 2021-09-12 NOTE — Procedures (Signed)
   HEMODIALYSIS TREATMENT NOTE:  4 hour HD session completed via left upper arm AVG (15g / antegrade).  Goal NOT met.  Soft blood pressures despite cooling of dialysate and Albumin.  Minimal UF ---> SBP<95.  Pt reports his "pressure drops" easily.  "They keep my top number over 95".  Serial troponin levels are increasing; report to Dr. Zirle-Ghosh.  Pt is without chest pain or dyspnea, in rate-controlled Afib, saturating 100% on room air.  Heparin infusion ordered by Dr. Johnson; started at 2030 (12h after last Eliquis) per pharmacy.  All blood was returned and hemostasis was achieved in 20 minutes. No changes from pre-HD assessment.  Pt was transferred from dialysis unit to stepdown.  Report given to Jessica Hearn, RN.   Total run time: 4 hours Net UF 72 cc  Angela Poteat, RN    Contains abnormal data Hepatitis B Surface Antibody Order: [id number]  Ref Range & Units 7 mo ago  Hep B S Ab Nonreactive, Grayzone Reactive Abnormal    Comment: Nonreactive and Grayzone results are considered non-immune.  Hep B Surf Ab Quant <8.00 m(IU)/mL 13.85 High    Resulting Agency  [facility]  Specimen Collected: 02/12/21 12:54 Last Resulted: 02/13/21 10:19  Received From: [facility]  Result Received: 04/20/21 12:15   

## 2021-09-12 NOTE — Consult Note (Signed)
Cardiology Consultation:   Patient ID: Shaun Burns MRN: [id number]; DOB: 06-Mar-1971  Admit date: 09/12/2021 Date of Consult: 09/12/2021  PCP:  Briant Sites, [place] Providers Cardiologist:  [facility] (also consultation with Dr. Harl Bowie 09/2020)   Patient Profile:   Shaun Burns is a 50 y.o. male with a history of persistent atrial fibrillation on Eliquis, ESRD on HD (MWF), HTN, two-vessel CABG in 2019 with severe native vessel and graft disease, DM-2, sustained VT with syncope status post Biotronik ICD placement in 01/2021, and previous GI bleed who is being seen 09/12/2021 for the evaluation of chest pain at the request of Dr. Wynetta Emery.  History of Present Illness:   Shaun Burns presents with complex history as above with last cath 02/12/21 at [facility] showing severe diffuse 3v CAD, occluded RCA with rt to lt collaterals to distal RCA, occluded VG to RCA ,patent LIMA-D2-LAD with 99% CTO lesion in mid LAD after LIMA anastomosis.  Diffuse CAD of small LCX artery, unable to pass wire past the mid LAD 99% stenosis which has the characteristics of a CTO.  Medical management planned.   Discuss whether CTO PCI to LAD is warranted given the risk of needing to perform this PCI through the LIMA.  He had actually presented at that time with sustained VT and syncope, LVEF 40 to 45%, underwent placement of Biotronik single-chamber ICD at that time.  It does not appear that he has had any regular cardiology follow-up since then, he reports problems with transportation.  He presented today for regular hemodialysis in [place] and mentioned prior to the session that he had been experiencing intermittent left-sided chest pressure over the last 2 weeks, radiating to the left arm.  States that this felt like prior angina, he does not have nitroglycerin at home.  States that he has been taking his regular medications otherwise.  He was sent to the [place] for further assessment.  Initial work-up  shows him to be in persistent atrial fibrillation with controlled heart rate, more prominent ST segment depression noted however in comparison to prior tracing suggestive of ischemia.  Initial high-sensitivity troponin I 98.  Also significantly anemic with hemoglobin 7.6, previously up around 11 in August at his dialysis center.  He does not report any obvious stool changes or other bleeding.  Does report a prior history of bleeding ulcer.  He does not describe any recent syncope, no device shocks.   Past Medical History:  Diagnosis Date   CAD (coronary artery disease)    a. s/p CABG in 2019 with LIMA-LAD-D1 and SVG-PDA b. cath 01/2021 [facility] showing CTO mLAD unable to be crossed with wire, diffusely diseased small circ, LIMA-D2-LAD patent, SVG-RCA occluded, and CTO RCA with L-->R collaterals and medical management was recommended   ESRD on hemodialysis [facility])    Essential hypertension    GERD (gastroesophageal reflux disease)    Headache    History of GI bleed    Ischemic cardiomyopathy    Biotronik single chamber ICD (VDD lead) - February 2022 [facility]   Sustained VT (ventricular tachycardia) [facility])    February 2022   Type 2 diabetes mellitus [facility])     Past Surgical History:  Procedure Laterality Date   A/V FISTULAGRAM N/A 08/13/2018   Procedure: A/V FISTULAGRAM;  Surgeon: Marty Heck, MD;  Location: [place] CV LAB;  Service: Cardiovascular;  Laterality: N/A;   A/V FISTULAGRAM N/A 01/25/2020   Procedure: A/V FISTULAGRAM - Left Arm;  Surgeon: Harold Barban  W, MD;  Location: [place] CV LAB;  Service: Cardiovascular;  Laterality: N/A;   AV FISTULA PLACEMENT Left 03/30/2015   Procedure: LEFT BRACHIOCEPHALIC ARTERIOVENOUS (AV) FISTULA CREATION;  Surgeon: Elam Dutch, MD;  Location: [place];  Service: Vascular;  Laterality: Left;   CORONARY ARTERY BYPASS GRAFT  2019   KNEE SURGERY     PERIPHERAL VASCULAR BALLOON ANGIOPLASTY Left 08/13/2018   Procedure: PERIPHERAL VASCULAR  BALLOON ANGIOPLASTY;  Surgeon: Marty Heck, MD;  Location: [place] CV LAB;  Service: Cardiovascular;  Laterality: Left;  central venous    PERIPHERAL VASCULAR BALLOON ANGIOPLASTY Left 01/25/2020   Procedure: PERIPHERAL VASCULAR BALLOON ANGIOPLASTY;  Surgeon: Serafina Mitchell, MD;  Location: [place] CV LAB;  Service: Cardiovascular;  Laterality: Left;  arm fistula   PERIPHERAL VASCULAR CATHETERIZATION Left 01/11/2016   Procedure: A/V Shuntogram/Fistulagram;  Surgeon: Conrad [place], MD;  Location: [place] CV LAB;  Service: Cardiovascular;  Laterality: Left;     Home Medications:  Prior to Admission medications   Medication Sig Start Date End Date Taking? Authorizing Provider  acetaminophen (TYLENOL) 500 MG tablet Take 1,500 mg by mouth 2 (two) times daily as needed for moderate pain.    Yes [provider]  albuterol (VENTOLIN HFA) 108 (90 Base) MCG/ACT inhaler Inhale 2 puffs into the lungs as needed for wheezing or shortness of breath.   Yes [provider]  amiodarone (PACERONE) 200 MG tablet Take 200 mg by mouth daily.   Yes [provider]  apixaban (ELIQUIS) 5 MG TABS tablet Take 1 tablet (5 mg total) by mouth 2 (two) times daily. Patient taking differently: Take 2.5 mg by mouth 2 (two) times daily. 10/19/20  Yes TatShanon Brow, MD  aspirin EC 81 MG tablet Take 81 mg by mouth 2 (two) times daily.  07/08/18  Yes [provider]  atorvastatin (LIPITOR) 40 MG tablet Take 40 mg by mouth every evening.   Yes [provider]  metoprolol succinate (TOPROL-XL) 50 MG 24 hr tablet Take 50 mg by mouth daily. 07/22/21  Yes [provider]  sevelamer carbonate (RENVELA) 800 MG tablet Take 800 mg by mouth in the morning and at bedtime. 10/12/20  Yes [provider]  carvedilol (COREG) 12.5 MG tablet Take 12.5 mg by mouth daily at 6 (six) AM. Patient not taking: Reported on 09/12/2021 10/12/20   [provider]  diltiazem  (CARDIZEM CD) 180 MG 24 hr capsule Take 1 capsule (180 mg total) by mouth daily. Patient not taking: No sig reported 10/19/20   Orson Eva, MD  isosorbide mononitrate (IMDUR) 60 MG 24 hr tablet Take 60 mg by mouth daily. Patient not taking: Reported on 09/12/2021 08/24/20   [provider]    Allergies:   No Known Allergies  Social History:   Social History   Tobacco Use   Smoking status: Every Day    Packs/day: 0.50    Types: Cigarettes   Smokeless tobacco: Never  Substance Use Topics   Alcohol use: No    Alcohol/week: 0.0 standard drinks     Family History:   Family History  Problem Relation Age of Onset   Hypertension Father    Stroke Father    Diabetes Mother      ROS:  Please see the history of present illness.  All other ROS reviewed and negative.     Physical Exam/Data:   Vitals:   09/12/21 0900 09/12/21 0930 09/12/21 1000 09/12/21 1030  BP: (!) 120/96  128/86 119/89 115/74  Pulse:      Resp: 20 19 18  (!) 22  Temp:      TempSrc:      SpO2: 98% 99% 100%   Weight:      Height:       No intake or output data in the 24 hours ending 09/12/21 1121 Last 3 Weights 09/12/2021 01/01/2021 10/19/2020  Weight (lbs) 205 lb 210 lb 207 lb 3.7 oz  Weight (kg) 92.987 kg 95.255 kg 94 kg     Body mass index is 29.41 kg/m.  HEENT: Conjunctiva and lids normal, oropharynx clear. Neck: Supple, no elevated JVP or carotid bruits, no thyromegaly. Lungs: Clear to auscultation, nonlabored breathing at rest. Cardiac: Regular rate and rhythm, no S3, 1/6 systolic murmur, no pericardial rub. Abdomen: Soft, nontender, bowel sounds present, no guarding or rebound. Extremities: Left upper extremity AV fistula with palpable thrill, no pitting edema, distal pulses 2+. Skin: Warm and dry. Musculoskeletal: No kyphosis. Neuropsychiatric: Alert and oriented x3, affect grossly appropriate.   EKG:  The EKG was personally reviewed and demonstrates:  Atrial fibrillation with controlled  ventricular response, ST segment depression inferolaterally which is more prominent in comparison to the previous tracing and suggestive of ischemia.  Telemetry:  Telemetry was personally reviewed and demonstrates:  Atrial fibrillation.  Relevant CV Studies:  Cardiac catheterization [facility] 02/12/2021: Findings:  1. Severe diffuse 3v CAD.  2. Occluded RCA with right-to-left and left-to-right collaterals that fill  the distal RCA.  3. Occluded SVG to RCA.    4. Patent LIMA-D2-LAD with a 99% CTO lesion in the mid LAD after the LIMA  anastomosis  5. Diffuse CAD of a small LCx artery.  6. Unable to pass a wire past the mid LAD 99% stenosis, which has the  characteristics of a CTO.   Echocardiogram [facility] 02/13/2021: Summary    1. The left ventricular systolic function is mildly decreased, LVEF is  visually estimated at 40-45%.    2. The left ventricle is normal in size with moderately to severely  increased wall thickness.    3. The anterolateral wall is hypokinetic.    4. There is mild mitral valve regurgitation.   Laboratory Data:  High Sensitivity Troponin:   Recent Labs  Lab 09/12/21 0903  TROPONINIHS 98*     Chemistry Recent Labs  Lab 09/12/21 0903  NA 137  K 5.0  CL 95*  CO2 27  GLUCOSE 119*  BUN 70*  CREATININE 11.76*  CALCIUM 9.3  GFRNONAA 5*  ANIONGAP 15    No results for input(s): PROT, ALBUMIN, AST, ALT, ALKPHOS, BILITOT in the last 168 hours. Hematology Recent Labs  Lab 09/12/21 0903 09/12/21 1006  WBC 8.1  --   RBC 2.50* 2.42*  HGB 7.6*  --   HCT 24.6*  --   MCV 98.4  --   MCH 30.4  --   MCHC 30.9  --   RDW 18.3*  --   PLT 188  --     Radiology/Studies:  DG Chest 2 View  Result Date: 09/12/2021 CLINICAL DATA:  Chest pain. EXAM: CHEST - 2 VIEW COMPARISON:  02/10/2021 FINDINGS: Two views of the chest demonstrate a right chest single lead ICD. Heart size is within normal limits. Both lungs are clear without pulmonary edema or  focal airspace disease. No pleural effusions. Prior median sternotomy. IMPRESSION: 1. No acute cardiopulmonary disease. Electronically Signed   By: Markus Daft M.D.   On: 09/12/2021 09:56  Assessment and Plan:   1.  Unstable angina, symptoms reported over the last 2 weeks.  Patient has known multivessel CAD with two-vessel CABG in 2019 and subsequently documented graft disease and inability to perform PCI of mid LAD CTO (distal to the LIMA anastomosis) at [facility] in February of this year.  Home medical regimen needs to be clarified per pharmacy review.  Initial high-sensitivity troponin I 98 in the setting of ESRD.  ECG does show new ischemic ST segment changes in comparison to prior tracing from January.  2.  Acute anemia, hemoglobin 7.6, reportedly down from the 11 range in August at his hemodialysis center.  He does not describe any changes in stool or other obvious bleeding.  Reportedly, has previous history of bleeding ulcer however.  Anemia could certainly be contributing to his angina as well.  3.  Persistent atrial fibrillation with CHA2DS2-VASc score of 4.  He has been on Eliquis for stroke prophylaxis as an outpatient.  Heart rate controlled today, looks to be on more than 1 AV nodal blocker and amiodarone although outpatient medication list needs to be verified by pharmacy.  4.  Ischemic cardiomyopathy, LVEF 40 to 45% by assessment at [facility] back in February.  5.  History of sustained VT and syncope status post Biotronik ICD at [facility] back in February.  Unclear that device has been followed except perhaps remotely.  He does not report any syncope or device shocks.  6.  ESRD on hemodialysis.  Medically complex patient with management through the [facility] system, although not seen since last hospitalization in February.  Ideally, he needs to be admitted to [facility] for further work-up and management by subspecialty services, no bed available at this time so  he is being admitted to the hospitalist team at [facility].  Would recommend cycling cardiac markers for trend, obtain a follow-up echocardiogram.  Would not start heparin given acute anemia.  Hold Eliquis and try to keep him on aspirin at least at this point.  Outpatient regimen needs to be clarified by pharmacy.  He will need at least some form of AV nodal blocker going forward, uncertain why he is on amiodarone if he has been in persistent atrial fibrillation, unless this has been purely for heart rate control.  He needs to have his ICD interrogated which we will set up.  Anticipate consultation from both nephrology and gastroenterology.  Ultimately, he may need a follow-up cardiac catheterization to see if there are any viable revascularization options, although it sounds like this was not the case on assessment at [facility] back in February.  Would transfuse PRBCs perhaps with hemodialysis to see if this helps with his angina as well.  For questions or updates, please contact [place] Please consult [email or web address] for contact info under    Signed, Rozann Lesches, MD  09/12/2021 11:21 AM

## 2021-09-12 NOTE — ED Notes (Signed)
REMS gave 324 ASA , 1 Nitro.

## 2021-09-12 NOTE — ED Notes (Signed)
Nurse educated pt on the importance of an occult sample. Pt verbalized he has been refusing them for 3 years and he didn't want to do it. Nurse told pt she will let him think about the education she gave and ask him again.

## 2021-09-12 NOTE — Progress Notes (Addendum)
ANTICOAGULATION CONSULT NOTE - Initial Consult  Pharmacy Consult for IV Heparin Indication: chest pain/ACS  No Known Allergies  Patient Measurements: Height: 5\' 10"  (177.8 cm) Weight: 95.2 kg (209 lb 14.1 oz) IBW/kg (Calculated) : 73 Heparin Dosing Weight:  91.8 kg  Vital Signs: Temp: 98.5 F (36.9 C) (09/14 1750) Temp Source: Oral (09/14 1750) BP: 118/87 (09/14 1804) Pulse Rate: 94 (09/14 1804)  Labs: Recent Labs    09/12/21 0903 09/12/21 1119 09/12/21 1718  HGB 7.6*  --   --   HCT 24.6*  --   --   PLT 188  --   --   CREATININE 11.76*  --   --   TROPONINIHS 98* 120* 326*    Estimated Creatinine Clearance: 8.8 mL/min (A) (by C-G formula based on SCr of 11.76 mg/dL (H)).   Medical History: Past Medical History:  Diagnosis Date   CAD (coronary artery disease)    a. s/p CABG in 2019 with LIMA-LAD-D1 and SVG-PDA b. cath 01/2021 [facility] showing CTO mLAD unable to be crossed with wire, diffusely diseased small circ, LIMA-D2-LAD patent, SVG-RCA occluded, and CTO RCA with L-->R collaterals and medical management was recommended   ESRD on hemodialysis [facility])    Essential hypertension    GERD (gastroesophageal reflux disease)    Headache    History of GI bleed    Ischemic cardiomyopathy    Biotronik single chamber ICD (VDD lead) - February 2022 [facility]   Sustained VT (ventricular tachycardia) [facility])    February 2022   Type 2 diabetes mellitus [facility])     Assessment: 50 yr old man with med hx significant for CAD (CABG in 2019 and PCI 01/2021), ESRD (on HD), chronic atrial fibrillation (on apixaban 2.5 mg PO BID PTA) presented to [facility] ED CP, elevated troponin and ECG suggesting ischemia. Pharmacy is consulted to dose IV heparin; per med rec, last dose of apixaban was at 0800 this AM. Given recent apixaban exposure, will monitor anticoagulation using aPTT until aPTT and heparin levels correlate.  H/H 7.6/24.6, plt 188; Hgb is down from 12/2020,, transfused 1 u PRBCs in ED, pt  refusing Hemoccult testing (has hx of UGI bleed; GI consulted). Pt currently in HD; HD RN said no issues with bleeding.  Goal of Therapy:  Heparin level 0.3-0.7 units/ml Monitor platelets by anticoagulation protocol: Yes   Plan:  Start heparin infusion at 1100 units/hr at [place] (time next dose of apixaban would be due) Check heparin level 8 hrs after starting heparin infusion Monitor daily heparin level, CBC Monitor for bleeding  Gillermina Hu, PharmD, BCPS, [facility] Clinical Pharmacist 09/12/2021,6:32 PM

## 2021-09-12 NOTE — ED Notes (Addendum)
Pt does dialysis Mon, Wed , Fri and his last session was Monday.  Bruit and thrill present to left arm.

## 2021-09-12 NOTE — ED Notes (Signed)
Date and time results received: 09/12/21 2029  Test: troponin Critical Value: 458  Name of Provider Notified: Tawny Asal, MD  Orders Received? Or Actions Taken?: acknowleged

## 2021-09-12 NOTE — Consult Note (Signed)
[facility] Nephrology Consult Note: Reason for Consult: To manage dialysis and dialysis related needs Referring Physician: Dr Laverta Baltimore, Lenna Sciara (EDP)  HPI:  Shaun Burns is an 50 y.o. male with history of hypertension, HLD, DM, CAD status post CABG, ESRD on HD MWF at [facility] presented with chest pain, seen as a consultation for the management of ESRD.  Patient went to dialysis center today where he was complaining of chest pain therefore the nursing staff sent him to ER for further evaluation.  The blood pressure is acceptable, he is on room air.  The labs showed potassium 5, BUN 70 creatinine 11.7 and hemoglobin of 7.6.  Chest x-ray with no acute finding. Patient currently reports chest pain on and off stated with some weakness.  No shortness of breath, nausea, vomiting, headache or dizziness. He reports dialysis for 4 hours 15 minutes, dry weight 92.5.   Past Medical History:  Diagnosis Date   CAD (coronary artery disease)    a. s/p CABG in 2019 with LIMA-LAD-D1 and SVG-PDA b. abnormal NST in 05/2020 with cath showing high grade mLAD, D1 prox and after bifurcation, D2 ostial, mLCx obstructive, LIMA-D2-LAD patent, SVG-RCA occluded, and CTO RCA with L-->R collaterals and medical management was recommended   Chronic kidney disease    Diabetes mellitus without complication (HCC)    GERD (gastroesophageal reflux disease)    Headache    Hypertension     Past Surgical History:  Procedure Laterality Date   A/V FISTULAGRAM N/A 08/13/2018   Procedure: A/V FISTULAGRAM;  Surgeon: Marty Heck, MD;  Location: [place] CV LAB;  Service: Cardiovascular;  Laterality: N/A;   A/V FISTULAGRAM N/A 01/25/2020   Procedure: A/V FISTULAGRAM - Left Arm;  Surgeon: Serafina Mitchell, MD;  Location: [place] CV LAB;  Service: Cardiovascular;  Laterality: N/A;   AV FISTULA PLACEMENT Left 03/30/2015   Procedure: LEFT BRACHIOCEPHALIC ARTERIOVENOUS (AV) FISTULA CREATION;  Surgeon: Elam Dutch, MD;  Location: [place];  Service: Vascular;  Laterality: Left;   KNEE SURGERY     PERIPHERAL VASCULAR BALLOON ANGIOPLASTY Left 08/13/2018   Procedure: PERIPHERAL VASCULAR BALLOON ANGIOPLASTY;  Surgeon: Marty Heck, MD;  Location: [place] CV LAB;  Service: Cardiovascular;  Laterality: Left;  central venous    PERIPHERAL VASCULAR BALLOON ANGIOPLASTY Left 01/25/2020   Procedure: PERIPHERAL VASCULAR BALLOON ANGIOPLASTY;  Surgeon: Serafina Mitchell, MD;  Location: [place] CV LAB;  Service: Cardiovascular;  Laterality: Left;  arm fistula   PERIPHERAL VASCULAR CATHETERIZATION Left 01/11/2016   Procedure: A/V Shuntogram/Fistulagram;  Surgeon: Conrad [place], MD;  Location: [place] CV LAB;  Service: Cardiovascular;  Laterality: Left;    Family History  Problem Relation Age of Onset   Hypertension Father    Stroke Father    Diabetes Mother     Social History:  reports that he has been smoking cigarettes. He has been smoking an average of .5 packs per day. He has never used smokeless tobacco. He reports that he does not drink alcohol and does not use drugs.  Allergies: No Known Allergies  Medications: I have reviewed the patient's current medications.   Results for orders placed or performed during the hospital encounter of 09/12/21 (from the past 48 hour(s))  Basic metabolic panel     Status: Abnormal   Collection Time: 09/12/21  9:03 AM  Result Value Ref Range   Sodium 137 135 - 145 mmol/L   Potassium 5.0 3.5 - 5.1 mmol/L   Chloride 95 (  L) 98 - 111 mmol/L   CO2 27 22 - 32 mmol/L   Glucose, Bld 119 (H) 70 - 99 mg/dL    Comment: Glucose reference range applies only to samples taken after fasting for at least 8 hours.   BUN 70 (H) 6 - 20 mg/dL   Creatinine, Ser 11.76 (H) 0.61 - 1.24 mg/dL   Calcium 9.3 8.9 - 10.3 mg/dL   GFR, Estimated 5 (L) >60 mL/min    Comment: (NOTE) Calculated using the CKD-EPI Creatinine Equation (2021)    Anion gap 15 5 - 15    Comment:  Performed at [facility], [address] [place], [place] [address]  CBC     Status: Abnormal   Collection Time: 09/12/21  9:03 AM  Result Value Ref Range   WBC 8.1 4.0 - 10.5 K/uL   RBC 2.50 (L) 4.22 - 5.81 MIL/uL   Hemoglobin 7.6 (L) 13.0 - 17.0 g/dL   HCT 24.6 (L) 39.0 - 52.0 %   MCV 98.4 80.0 - 100.0 fL   MCH 30.4 26.0 - 34.0 pg   MCHC 30.9 30.0 - 36.0 g/dL   RDW 18.3 (H) 11.5 - 15.5 %   Platelets 188 150 - 400 K/uL   nRBC 0.0 0.0 - 0.2 %    Comment: Performed at [facility], [address] [place], [place] [address]  Troponin I (High Sensitivity)     Status: Abnormal   Collection Time: 09/12/21  9:03 AM  Result Value Ref Range   Troponin I (High Sensitivity) 98 (H) <18 ng/L    Comment: (NOTE) Elevated high sensitivity troponin I (hsTnI) values and significant  changes across serial measurements may suggest ACS but many other  chronic and acute conditions are known to elevate hsTnI results.  Refer to the "Links" section for chest pain algorithms and additional  guidance. Performed at [facility], [address], [place], [place] [address]     DG Chest 2 View  Result Date: 09/12/2021 CLINICAL DATA:  Chest pain. EXAM: CHEST - 2 VIEW COMPARISON:  02/10/2021 FINDINGS: Two views of the chest demonstrate a right chest single lead ICD. Heart size is within normal limits. Both lungs are clear without pulmonary edema or focal airspace disease. No pleural effusions. Prior median sternotomy. IMPRESSION: 1. No acute cardiopulmonary disease. Electronically Signed   By: Markus Daft M.D.   On: 09/12/2021 09:56    ROS: As per H&P.  Rest of the systems reviewed and are negative. Blood pressure 119/89, pulse 80, temperature 98 F (36.7 C), temperature source Oral, resp. rate 18, height 5\' 10"  (1.778 m), weight 93 kg, SpO2 100 %. Gen: NAD, comfortable Respiratory: Clear bilateral, no wheezing or crackle Cardiovascular: Regular rate rhythm S1-S2 normal, no rubs GI: Abdomen soft, nontender,  nondistended Extremities, no cyanosis or clubbing, no edema Skin: No rash or ulcer Neurology: Alert, awake, following commands, oriented Dialysis Access: AV fistula has good thrill and bruit.  Assessment/Plan:  #Chest pain: Troponin mildly elevated.  The ER is consulting cardiology team.  # ESRD: MWF at [place].  Plan for dialysis today.  No heparin because of anemia.  # Hypertension: Blood pressure acceptable.  Continue cardiac medication.  UF during HD.  # Anemia of ESRD: Check iron studies.  We will need to obtain outpatient record for ESA.  # Metabolic Bone Disease: Monitor calcium phosphorus level.  Discussed with the ER physician. I will talk to the dialysis nurse.  Gurjit Loconte Tanna Furry 09/12/2021, 10:18 AM

## 2021-09-12 NOTE — Consult Note (Addendum)
Referring Provider: Murlean Iba, MD Primary Care Physician:  Briant Sites, PA-C Primary Gastroenterologist:    Reason for Consultation:  concern for UGI bleed, h/o bleeding ulcers, presents with acute anemia on apixaban  HPI: Shaun Burns is a 50 y.o. male with past medical history significant for history of biclonal gammopathy of uncertain significance, A. fib on Eliquis, syncope status post ICD placement February 2022, CAD, end-stage renal disease on hemodialysis (MWF), diabetes mellitus, hypertension, H/O UGI bleed due to gastric ulcers in 2018, who presented to the ED from dialysis due to chest pain.  GI consulted for concern for upper GI bleed.  In the ED: Vital signs have been stable.  Hemoglobin 7.6.  Hemoglobin was 11.01 January 2021. Per attending, dialysis records showed Hgb in 11 range last month. Patient refused rectal exam for guaiac testing.  Sodium 137.  Potassium 5.  Creatinine 11.76.  High-sensitivity troponin 98-->120. Platelets 188,000.  SARS coronavirus 2 negative.  Vitamin B12 329.  Folate 6.4.  Iron 99, TIBC 246, iron saturations 40%.  Ferritin 640.  Patient complains of intermittent left-sided chest pressure over the last 2 weeks, radiating into the left arm.  States that it felt like prior angina.  Initial work-up showed persistent A. fib with controlled heart rate, no ischemic ST segment changes, initial troponin of 98.  Also noted to have a hemoglobin of 7.6.  He denies melena or rectal bleeding.  Per attending hgb in 11 range per dialysis records. He states he gets iv iron with dialysis. BM 1-2 times per week. No abdominal pain or vomiting. Complains of having to "wash his food down" since his CABG. No heartburn.   He has history of prior UGI bleeding in 06/2017 (coffee ground emesis/melena) in setting of ibuprofen/Goody's use. EGD 07/22/21 with two gastric ulcers, one lear ulcer at [facility] (op note unavailable-info from d/c summary). Path with chronic gastritis  and fibrinopurulent exudate c/w ulcer formation, h.pylori.  F/u EGD 10/2017 to verify ulcer healing. Again op not not available but path with gastric erosions, no h.pylori. Takes aspirin 81 mg twice daily but no other NSAIDs.  No prior colonoscopy and patient refuses.  Has been seen by cardiology this admission who has recommended admission to [facility] for further work-up and management.  Unfortunately no bed available at this time so he is being admitted to our facility.  Eliquis and heparin on hold due to acute anemia.  Aspirin 81 mg twice daily continued.   Prior to Admission medications   Medication Sig Start Date End Date Taking? Authorizing Provider  acetaminophen (TYLENOL) 500 MG tablet Take 1,500 mg by mouth 2 (two) times daily as needed for moderate pain.    Yes [provider]  albuterol (VENTOLIN HFA) 108 (90 Base) MCG/ACT inhaler Inhale 2 puffs into the lungs as needed for wheezing or shortness of breath.   Yes [provider]  amiodarone (PACERONE) 200 MG tablet Take 200 mg by mouth daily.   Yes [provider]  apixaban (ELIQUIS) 5 MG TABS tablet Take 1 tablet (5 mg total) by mouth 2 (two) times daily. Patient taking differently: Take 2.5 mg by mouth 2 (two) times daily. 10/19/20  Yes TatShanon Brow, MD  aspirin EC 81 MG tablet Take 81 mg by mouth 2 (two) times daily.  07/08/18  Yes [provider]  atorvastatin (LIPITOR) 40 MG tablet Take 40 mg by mouth every evening.   Yes [provider]  metoprolol succinate (TOPROL-XL) 50 MG 24  hr tablet Take 50 mg by mouth daily. 07/22/21  Yes [provider]  sevelamer carbonate (RENVELA) 800 MG tablet Take 800 mg by mouth in the morning and at bedtime. 10/12/20  Yes [provider]  carvedilol (COREG) 12.5 MG tablet Take 12.5 mg by mouth daily at 6 (six) AM. Patient not taking: Reported on 09/12/2021 10/12/20   [provider]  diltiazem (CARDIZEM CD) 180 MG 24 hr  capsule Take 1 capsule (180 mg total) by mouth daily. Patient not taking: No sig reported 10/19/20   Orson Eva, MD  isosorbide mononitrate (IMDUR) 60 MG 24 hr tablet Take 60 mg by mouth daily. Patient not taking: Reported on 09/12/2021 08/24/20   [provider]    Current Facility-Administered Medications  Medication Dose Route Frequency Provider Last Rate Last Admin   acetaminophen (TYLENOL) tablet 650 mg  650 mg Oral Q6H PRN Johnson, Clanford L, MD       Or   acetaminophen (TYLENOL) suppository 650 mg  650 mg Rectal Q6H PRN Johnson, Clanford L, MD       albuterol (VENTOLIN HFA) 108 (90 Base) MCG/ACT inhaler 2 puff  2 puff Inhalation PRN Johnson, Clanford L, MD       amiodarone (PACERONE) tablet 200 mg  200 mg Oral Daily Johnson, Clanford L, MD   200 mg at 09/12/21 1402   aspirin EC tablet 81 mg  81 mg Oral BID Johnson, Clanford L, MD   81 mg at 09/12/21 1402   atorvastatin (LIPITOR) tablet 40 mg  40 mg Oral QPM Johnson, Clanford L, MD       bisacodyl (DULCOLAX) EC tablet 5 mg  5 mg Oral Daily PRN Johnson, Clanford L, MD       Chlorhexidine Gluconate Cloth 2 % PADS 6 each  6 each Topical Q0600 Rosita Fire, MD       fentaNYL (SUBLIMAZE) injection 25 mcg  25 mcg Intravenous Q2H PRN Johnson, Clanford L, MD       insulin aspart (novoLOG) injection 0-6 Units  0-6 Units Subcutaneous TID WC Johnson, Clanford L, MD       [START ON 09/13/2021] metoprolol succinate (TOPROL-XL) 24 hr tablet 50 mg  50 mg Oral Daily Johnson, Clanford L, MD       nicotine (NICODERM CQ - dosed in mg/24 hours) patch 14 mg  14 mg Transdermal Daily PRN Johnson, Clanford L, MD       nitroGLYCERIN (NITROGLYN) 2 % ointment 0.5 inch  0.5 inch Topical Q8H Isaiah Serge, NP   0.5 inch at 09/12/21 1447   nitroGLYCERIN (NITROSTAT) SL tablet 0.4 mg  0.4 mg Sublingual Q5 min PRN Johnson, Clanford L, MD       ondansetron (ZOFRAN) tablet 4 mg  4 mg Oral Q6H PRN Johnson, Clanford L, MD       Or   ondansetron (ZOFRAN)  injection 4 mg  4 mg Intravenous Q6H PRN Johnson, Clanford L, MD       pantoprazole (PROTONIX) EC tablet 40 mg  40 mg Oral Q0600 Johnson, Clanford L, MD   40 mg at 09/12/21 1402   sevelamer carbonate (RENVELA) tablet 800 mg  800 mg Oral TID WC Johnson, Clanford L, MD       Current Outpatient Medications  Medication Sig Dispense Refill   acetaminophen (TYLENOL) 500 MG tablet Take 1,500 mg by mouth 2 (two) times daily as needed for moderate pain.      albuterol (VENTOLIN HFA) 108 (90 Base) MCG/ACT  inhaler Inhale 2 puffs into the lungs as needed for wheezing or shortness of breath.     amiodarone (PACERONE) 200 MG tablet Take 200 mg by mouth daily.     apixaban (ELIQUIS) 5 MG TABS tablet Take 1 tablet (5 mg total) by mouth 2 (two) times daily. (Patient taking differently: Take 2.5 mg by mouth 2 (two) times daily.) 60 tablet 1   aspirin EC 81 MG tablet Take 81 mg by mouth 2 (two) times daily.      atorvastatin (LIPITOR) 40 MG tablet Take 40 mg by mouth every evening.     metoprolol succinate (TOPROL-XL) 50 MG 24 hr tablet Take 50 mg by mouth daily.     sevelamer carbonate (RENVELA) 800 MG tablet Take 800 mg by mouth in the morning and at bedtime.     carvedilol (COREG) 12.5 MG tablet Take 12.5 mg by mouth daily at 6 (six) AM. (Patient not taking: Reported on 09/12/2021)     diltiazem (CARDIZEM CD) 180 MG 24 hr capsule Take 1 capsule (180 mg total) by mouth daily. (Patient not taking: No sig reported) 30 capsule 1   isosorbide mononitrate (IMDUR) 60 MG 24 hr tablet Take 60 mg by mouth daily. (Patient not taking: Reported on 09/12/2021)      Allergies as of 09/12/2021   (No Known Allergies)    Past Medical History:  Diagnosis Date   CAD (coronary artery disease)    a. s/p CABG in 2019 with LIMA-LAD-D1 and SVG-PDA b. cath 01/2021 [facility] showing CTO mLAD unable to be crossed with wire, diffusely diseased small circ, LIMA-D2-LAD patent, SVG-RCA occluded, and CTO RCA with L-->R collaterals and medical  management was recommended   ESRD on hemodialysis [facility])    Essential hypertension    GERD (gastroesophageal reflux disease)    Headache    History of GI bleed    Ischemic cardiomyopathy    Biotronik single chamber ICD (VDD lead) - February 2022 [facility]   Sustained VT (ventricular tachycardia) [facility])    February 2022   Type 2 diabetes mellitus [facility])     Past Surgical History:  Procedure Laterality Date   A/V FISTULAGRAM N/A 08/13/2018   Procedure: A/V FISTULAGRAM;  Surgeon: Marty Heck, MD;  Location: [place] CV [facility];  Service: Cardiovascular;  Laterality: N/A;   A/V FISTULAGRAM N/A 01/25/2020   Procedure: A/V FISTULAGRAM - Left Arm;  Surgeon: Serafina Mitchell, MD;  Location: [place] CV LAB;  Service: Cardiovascular;  Laterality: N/A;   AV FISTULA PLACEMENT Left 03/30/2015   Procedure: LEFT BRACHIOCEPHALIC ARTERIOVENOUS (AV) FISTULA CREATION;  Surgeon: Elam Dutch, MD;  Location: [place];  Service: Vascular;  Laterality: Left;   CORONARY ARTERY BYPASS GRAFT  2019   KNEE SURGERY     PERIPHERAL VASCULAR BALLOON ANGIOPLASTY Left 08/13/2018   Procedure: PERIPHERAL VASCULAR BALLOON ANGIOPLASTY;  Surgeon: Marty Heck, MD;  Location: [place] CV LAB;  Service: Cardiovascular;  Laterality: Left;  central venous    PERIPHERAL VASCULAR BALLOON ANGIOPLASTY Left 01/25/2020   Procedure: PERIPHERAL VASCULAR BALLOON ANGIOPLASTY;  Surgeon: Serafina Mitchell, MD;  Location: [place] CV LAB;  Service: Cardiovascular;  Laterality: Left;  arm fistula   PERIPHERAL VASCULAR CATHETERIZATION Left 01/11/2016   Procedure: A/V Shuntogram/Fistulagram;  Surgeon: Conrad [place], MD;  Location: [place] CV LAB;  Service: Cardiovascular;  Laterality: Left;    Family History  Problem Relation Age of Onset   Hypertension Father    Stroke Father  Diabetes Mother     Social History   Socioeconomic History   Marital status: Legally Separated    Spouse name: Not on file   Number  of children: Not on file   Years of education: Not on file   Highest education level: Not on file  Occupational History   Not on file  Tobacco Use   Smoking status: Every Day    Packs/day: 0.50    Types: Cigarettes   Smokeless tobacco: Never  Substance and Sexual Activity   Alcohol use: No    Alcohol/week: 0.0 standard drinks   Drug use: No   Sexual activity: Not on file  Other Topics Concern   Not on file  Social History Narrative   Not on file   Social Determinants of Health   Financial Resource Strain: Not on file  Food Insecurity: Not on file  Transportation Needs: Not on file  Physical Activity: Not on file  Stress: Not on file  Social Connections: Not on file  Intimate Partner Violence: Not on file     ROS:  General: Negative for anorexia, weight loss, fever, chills, fatigue, weakness. +lightheadedness Eyes: Negative for vision changes.  ENT: Negative for hoarseness, difficulty swallowing , nasal congestion. CV: Negative for  palpitations, dyspnea on exertion, peripheral edema. See hpi Respiratory: Negative for dyspnea at rest, dyspnea on exertion, cough, sputum, wheezing.  GI: See history of present illness. GU:  Negative for dysuria, hematuria, urinary incontinence, urinary frequency, nocturnal urination.  MS: Negative for joint pain. +low back pain.  Derm: Negative for rash or itching.  Neuro: Negative for weakness, abnormal sensation, seizure, frequent headaches, memory loss, confusion.  Psych: Negative for anxiety, depression, suicidal ideation, hallucinations.  Endo: Negative for unusual weight change.  Heme: Negative for bruising or bleeding. Allergy: Negative for rash or hives.       Physical Examination: Vital signs in last 24 hours: Temp:  [97.6 F (36.4 C)-98 F (36.7 C)] 97.6 F (36.4 C) (09/14 1256) Pulse Rate:  [66-95] 88 (09/14 1345) Resp:  [12-23] 20 (09/14 1345) BP: (98-128)/(65-97) 103/79 (09/14 1345) SpO2:  [98 %-100 %] 100 % (09/14  1345) Weight:  [93 kg] 93 kg (09/14 0850)    General: Well-nourished, well-developed in no acute distress.  Head: Normocephalic, atraumatic.   Eyes: Conjunctiva pink, no icterus. Mouth: masked Neck: Supple without thyromegaly, masses, or lymphadenopathy.  Lungs: Clear to auscultation bilaterally.  Heart: Regular rate and rhythm, no murmurs rubs or gallops.  Abdomen: Bowel sounds are normal, nontender, nondistended, no hepatosplenomegaly or masses, no abdominal bruits or    hernia , no rebound or guarding.   Rectal: not performed, patient refused Extremities: No lower extremity edema, clubbing, deformity.  Neuro: Alert and oriented x 4 , grossly normal neurologically.  Skin: Warm and dry, no rash or jaundice.   Psych: Alert and cooperative, normal mood and affect.        Intake/Output from previous day: No intake/output data recorded. Intake/Output this shift: No intake/output data recorded.  Lab Results: CBC Recent Labs    09/12/21 0903  WBC 8.1  HGB 7.6*  HCT 24.6*  MCV 98.4  PLT 188   BMET Recent Labs    09/12/21 0903  NA 137  K 5.0  CL 95*  CO2 27  GLUCOSE 119*  BUN 70*  CREATININE 11.76*  CALCIUM 9.3   LFT No results for input(s): BILITOT, BILIDIR, IBILI, ALKPHOS, AST, ALT, PROT, ALBUMIN in the last 72 hours.  Lipase No results  for input(s): LIPASE in the last 72 hours.  PT/INR No results for input(s): LABPROT, INR in the last 72 hours.    Imaging Studies: DG Chest 2 View  Result Date: 09/12/2021 CLINICAL DATA:  Chest pain. EXAM: CHEST - 2 VIEW COMPARISON:  02/10/2021 FINDINGS: Two views of the chest demonstrate a right chest single lead ICD. Heart size is within normal limits. Both lungs are clear without pulmonary edema or focal airspace disease. No pleural effusions. Prior median sternotomy. IMPRESSION: 1. No acute cardiopulmonary disease. Electronically Signed   By: Markus Daft M.D.   On: 09/12/2021 09:56  [4 week]   Impression: 50 y/o male with  ESRD on HD, Afib on Eliquis, CAD s/p CABG with subsequent documented graft disease, ICD, h/o UGI bleed due to ulcers in setting of ASA/NSAIDs in 2018 presenting with chest pain. GI consulted due to anemia, concern for GI bleed.  Anemia: Hgb 7.6 on presentation. Per notes, his Hgb was in 11 range in August at dialysis center. Well documented Hgb in 11 range in 01/2021. Patient reports receiving IV iron at times. Received blood transfusions in 2018 with UGI bleed but he is not aware of further transfusions. He denies melena, brbpr. Has BM 2 per week. Hemoccult status unknown, patient refuses DRE. He has had occasional gross hematuria but no other obvious bleeding. Takes ASA 81 mg BID, Eliquis 2.5mg  BID. No evidence of IDA today on labs. B12, folate normal. He has received one unit of prbcs.  Currently no chest pain, High Sensitivity Troponin 98-->120. EKG changes as previously outlined. Heparin and Eliquis on hold per cardiology due to acute anemia. ASA 81mg  BID has been continued. Given no overt GI bleeding would hold off on endoscopy evaluation until stable from cardiac standpoint but he would likely benefit from evaluation given his need for ongoing anticoagulation. Patient declines colonoscopy.  Plan: PPI BID. If anticoagulation needed, would consider heparin drip, which could be stopped and reversed quickly if he displayed drop in Hgb or overt GI bleeding.   Monitor for overt GI bleeding which would indicate more urgent procedure.   We would like to thank you for the opportunity to participate in the care of Shaun Burns.  Laureen Ochs. Bernarda Caffey [facility] [phone] 9/14/20224:17 PM    LOS: 0 days

## 2021-09-12 NOTE — ED Provider Notes (Signed)
Emergency Department Provider Note   I have reviewed the triage vital signs and the nursing notes.   HISTORY  Chief Complaint Chest Pain   HPI Shaun Burns is a 50 y.o. male with past medical history of CAD, ESRD, DM, and HTN presents to the emergency department from dialysis with chest pain.  Patient states he had fairly constant pain over the past week with pain worsening especially over the past 24 hours.  He mentioned this at dialysis and was sent here by EMS.  Patient describes the pain as "tingly and numb" feeling and reminds him somewhat of prior ischemic pain that he's had in the past.  He denies any fevers or chills.  No cough.  No abdominal pain, nausea, vomiting, diaphoresis.  No radiation of symptoms or other modifying factors.  Does state that the pain was bad enough last night that kept him from sleeping for most of the night.    Past Medical History:  Diagnosis Date   CAD (coronary artery disease)    a. s/p CABG in 2019 with LIMA-LAD-D1 and SVG-PDA b. cath 01/2021 [facility] showing CTO mLAD unable to be crossed with wire, diffusely diseased small circ, LIMA-D2-LAD patent, SVG-RCA occluded, and CTO RCA with L-->R collaterals and medical management was recommended   ESRD on hemodialysis [facility])    Essential hypertension    GERD (gastroesophageal reflux disease)    Headache    History of GI bleed    Ischemic cardiomyopathy    Biotronik single chamber ICD (VDD lead) - February 2022 [facility]   Sustained VT (ventricular tachycardia) [facility])    February 2022   Type 2 diabetes mellitus [facility])     Patient Active Problem List   Diagnosis Date Noted   Chest pain 09/12/2021   Abnormal electrocardiogram (ECG) (EKG) 09/12/2021   Elevated troponin 09/12/2021   Anemia in chronic kidney disease (CKD) 09/12/2021   Refuses rectal examination for guiac testing 09/12/2021   Atrial fibrillation with RVR (HCC)    Chronic atrial fibrillation ([place]) 10/16/2020   GERD (gastroesophageal reflux  disease)    Noncompliance with medication regimen    Hypertension    Type 2 diabetes mellitus (HCC)    Tobacco abuse    S/P CABG (coronary artery bypass graft)    CAD (coronary artery disease)    ESRD on dialysis ([place]) 01/04/2016    Past Surgical History:  Procedure Laterality Date   A/V FISTULAGRAM N/A 08/13/2018   Procedure: A/V FISTULAGRAM;  Surgeon: Marty Heck, MD;  Location: [place] [facility];  Service: Cardiovascular;  Laterality: N/A;   A/V FISTULAGRAM N/A 01/25/2020   Procedure: A/V FISTULAGRAM - Left Arm;  Surgeon: Serafina Mitchell, MD;  Location: [place] [facility];  Service: Cardiovascular;  Laterality: N/A;   AV FISTULA PLACEMENT Left 03/30/2015   Procedure: LEFT BRACHIOCEPHALIC ARTERIOVENOUS (AV) FISTULA CREATION;  Surgeon: Elam Dutch, MD;  Location: [place];  Service: Vascular;  Laterality: Left;   CORONARY ARTERY BYPASS GRAFT  2019   KNEE SURGERY     PERIPHERAL VASCULAR BALLOON ANGIOPLASTY Left 08/13/2018   Procedure: PERIPHERAL VASCULAR BALLOON ANGIOPLASTY;  Surgeon: Marty Heck, MD;  Location: [place] [facility];  Service: Cardiovascular;  Laterality: Left;  central venous    PERIPHERAL VASCULAR BALLOON ANGIOPLASTY Left 01/25/2020   Procedure: PERIPHERAL VASCULAR BALLOON ANGIOPLASTY;  Surgeon: Serafina Mitchell, MD;  Location: [place] [facility];  Service: Cardiovascular;  Laterality: Left;  arm fistula   PERIPHERAL VASCULAR CATHETERIZATION Left 01/11/2016  Procedure: A/V Shuntogram/Fistulagram;  Surgeon: Conrad Baldwyn, MD;  Location: [place] [facility];  Service: Cardiovascular;  Laterality: Left;    Allergies Patient has no known allergies.  Family History  Problem Relation Age of Onset   Hypertension Father    Stroke Father    Diabetes Mother     Social History Social History   Tobacco Use   Smoking status: Every Day    Packs/day: 0.50    Types: Cigarettes   Smokeless tobacco: Never  Substance Use Topics   Alcohol use: No     Alcohol/week: 0.0 standard drinks   Drug use: No    Review of Systems  Constitutional: No fever/chills Eyes: No visual changes. ENT: No sore throat. Cardiovascular: Positive chest pain. Respiratory: Denies shortness of breath. Gastrointestinal: No abdominal pain.  No nausea, no vomiting.  No diarrhea.  No constipation. Genitourinary: Negative for dysuria. Musculoskeletal: Negative for back pain. Skin: Negative for rash. Neurological: Negative for headaches, focal weakness or numbness.  10-point ROS otherwise negative.  ____________________________________________   PHYSICAL EXAM:  VITAL SIGNS: ED Triage Vitals  Enc Vitals Group     BP 09/12/21 0845 128/85     Pulse Rate 09/12/21 0852 80     Resp 09/12/21 0845 19     Temp 09/12/21 0849 98 F (36.7 C)     Temp Source 09/12/21 0849 Oral     SpO2 09/12/21 0852 100 %     Weight 09/12/21 0850 205 lb (93 kg)     Height 09/12/21 0850 5\' 10"  (1.778 m)    Constitutional: Alert and oriented. Well appearing and in no acute distress. Eyes: Conjunctivae are normal.  Head: Atraumatic. Nose: No congestion/rhinnorhea. Mouth/Throat: Mucous membranes are moist.   Neck: No stridor.  Cardiovascular: Normal rate, regular rhythm. Good peripheral circulation. Grossly normal heart sounds.   Respiratory: Normal respiratory effort.  No retractions. Lungs CTAB. Gastrointestinal: Soft and nontender. No distention.  Musculoskeletal: No lower extremity tenderness nor edema. No gross deformities of extremities. Neurologic:  Normal speech and language. No gross focal neurologic deficits are appreciated.  Skin:  Skin is warm, dry and intact. No rash noted.  ____________________________________________   LABS (all labs ordered are listed, but only abnormal results are displayed)  Labs Reviewed  BASIC METABOLIC PANEL - Abnormal; Notable for the following components:      Result Value   Chloride 95 (*)    Glucose, Bld 119 (*)    BUN 70 (*)     Creatinine, Ser 11.76 (*)    GFR, Estimated 5 (*)    All other components within normal limits  CBC - Abnormal; Notable for the following components:   RBC 2.50 (*)    Hemoglobin 7.6 (*)    HCT 24.6 (*)    RDW 18.3 (*)    All other components within normal limits  IRON AND TIBC - Abnormal; Notable for the following components:   TIBC 246 (*)    Saturation Ratios 40 (*)    All other components within normal limits  FERRITIN - Abnormal; Notable for the following components:   Ferritin 640 (*)    All other components within normal limits  RETICULOCYTES - Abnormal; Notable for the following components:   Retic Ct Pct 3.2 (*)    RBC. 2.42 (*)    Immature Retic Fract 34.1 (*)    All other components within normal limits  TROPONIN I (HIGH SENSITIVITY) - Abnormal; Notable for the following components:   Troponin I (  High Sensitivity) 98 (*)    All other components within normal limits  TROPONIN I (HIGH SENSITIVITY) - Abnormal; Notable for the following components:   Troponin I (High Sensitivity) 120 (*)    All other components within normal limits  RESP PANEL BY RT-PCR (FLU A&B, COVID) ARPGX2  VITAMIN B12  FOLATE  HEMOGLOBIN A1C  CBG MONITORING, ED  POC OCCULT BLOOD, ED  TYPE AND SCREEN  PREPARE RBC (CROSSMATCH)  ABO/RH   ____________________________________________  EKG   EKG Interpretation  Date/Time:  Wednesday September 12 2021 08:46:33 EDT Ventricular Rate:  86 PR Interval:    QRS Duration: 110 QT Interval:  356 QTC Calculation: 426 R Axis:   25 Text Interpretation: Atrial fibrillation  ST depression lateral and anterior worse when compared to prior Baseline wander in lead(s) V3 Confirmed by Nanda Quinton [phone]) on 09/12/2021 9:02:29 AM        ____________________________________________  RADIOLOGY  DG Chest 2 View  Result Date: 09/12/2021 CLINICAL DATA:  Chest pain. EXAM: CHEST - 2 VIEW COMPARISON:  02/10/2021 FINDINGS: Two views of the chest demonstrate a  right chest single lead ICD. Heart size is within normal limits. Both lungs are clear without pulmonary edema or focal airspace disease. No pleural effusions. Prior median sternotomy. IMPRESSION: 1. No acute cardiopulmonary disease. Electronically Signed   By: Markus Daft M.D.   On: 09/12/2021 09:56    ____________________________________________   PROCEDURES  Procedure(s) performed:   .Critical Care Performed by: Margette Fast, MD Authorized by: Margette Fast, MD   Critical care provider statement:    Critical care time (minutes):  35   Critical care time was exclusive of:  Separately billable procedures and treating other patients and teaching time   Critical care was necessary to treat or prevent imminent or life-threatening deterioration of the following conditions:  Circulatory failure   Critical care was time spent personally by me on the following activities:  Discussions with consultants, evaluation of patient's response to treatment, examination of patient, ordering and performing treatments and interventions, ordering and review of laboratory studies, ordering and review of radiographic studies, pulse oximetry, re-evaluation of patient's condition, obtaining history from patient or surrogate, review of old charts, blood draw for specimens and development of treatment plan with patient or surrogate   I assumed direction of critical care for this patient from another provider in my specialty: no     Care discussed with: admitting provider     ____________________________________________   INITIAL IMPRESSION / [place] / ED COURSE  Pertinent labs & imaging results that were available during my care of the patient were reviewed by me and considered in my medical decision making (see chart for details).   Patient presents the emergency department for evaluation of chest discomfort over the past week but worsening in the last 24 hours and keeping him from sleeping last  night.  The description of pain is somewhat atypical as tingly/numb but patient does state it reminds him somewhat of prior ischemic cardiac pain.  His EKG does show ST depressions in V4 through V6 as well as leads I and aVL.  No clear STEMI changes on EKG but ST changes are new from prior.   Differential includes all life-threatening causes for chest pain. This includes but is not exclusive to acute coronary syndrome, aortic dissection, pulmonary embolism, cardiac tamponade, community-acquired pneumonia, pericarditis, musculoskeletal chest wall pain, etc.  Labs reviewed. Patient with significant drop in H/H compared to values earlier this  year and late 2021. Patient denies any black or BRB in stool. He does describe a history of UGIB requiring transfusion but notes that was "years ago." We discussed needing to check for GI bleed source with DRE but patient refusing. Discussed risk of delaying diagnosis but tells me "they've been trying to get me to do that for years but I just don't want to." Will order PRBC to be given slowly and discussed with Nephrology here at [facility]. They will transfuse later today but several people to be done first.   Discussed case with Dr. Domenic Polite with Cardiology. Patient has some EKG changes and mildly elevated troponin. Would prefer patient be admitted to [facility] in case heart cath needed at some point but no beds available there until possibly tomorrow. Discussed with [facility] and they will admit here with Cardiology consulting.   Discussed patient's case with [facility] to request admission. Patient and family (if present) updated with plan. Care transferred to [facility] service.  I reviewed all nursing notes, vitals, pertinent old records, EKGs, labs, imaging (as available).   ____________________________________________  FINAL CLINICAL IMPRESSION(S) / ED DIAGNOSES  Final diagnoses:  Precordial chest pain  Symptomatic anemia     MEDICATIONS GIVEN DURING THIS VISIT:  Medications   Chlorhexidine Gluconate Cloth 2 % PADS 6 each (has no administration in time range)  insulin aspart (novoLOG) injection 0-6 Units (0 Units Subcutaneous Not Given 09/12/21 1340)  acetaminophen (TYLENOL) tablet 650 mg (has no administration in time range)    Or  acetaminophen (TYLENOL) suppository 650 mg (has no administration in time range)  fentaNYL (SUBLIMAZE) injection 25 mcg (has no administration in time range)  bisacodyl (DULCOLAX) EC tablet 5 mg (has no administration in time range)  ondansetron (ZOFRAN) tablet 4 mg (has no administration in time range)    Or  ondansetron (ZOFRAN) injection 4 mg (has no administration in time range)  pantoprazole (PROTONIX) EC tablet 40 mg (has no administration in time range)  nicotine (NICODERM CQ - dosed in mg/24 hours) patch 14 mg (has no administration in time range)  nitroGLYCERIN (NITROSTAT) SL tablet 0.4 mg (has no administration in time range)  nitroGLYCERIN (NITROGLYN) 2 % ointment 0.5 inch (has no administration in time range)  albuterol (VENTOLIN HFA) 108 (90 Base) MCG/ACT inhaler 2 puff (has no administration in time range)  amiodarone (PACERONE) tablet 200 mg (has no administration in time range)  aspirin EC tablet 81 mg (has no administration in time range)  atorvastatin (LIPITOR) tablet 40 mg (has no administration in time range)  metoprolol succinate (TOPROL-XL) 24 hr tablet 50 mg (has no administration in time range)  sevelamer carbonate (RENVELA) tablet 800 mg (has no administration in time range)  0.9 %  sodium chloride infusion (10 mL/hr Intravenous New Bag/Given 09/12/21 1258)    Note:  This document was prepared using Dragon voice recognition software and may include unintentional dictation errors.  Nanda Quinton, MD, [facility] Emergency Medicine    Amairany Schumpert, [facility], MD 09/12/21 1351

## 2021-09-12 NOTE — ED Notes (Signed)
Date and time results received: 09/12/21 1205   Test: Troponin Critical Value: 120  Name of Provider Notified: Dr. Laverta Baltimore   Orders Received? Or Actions Taken?: Notified

## 2021-09-12 NOTE — ED Notes (Signed)
Pt on the way to dialysis on 2nd floor.

## 2021-09-13 ENCOUNTER — Inpatient Hospital Stay (HOSPITAL_COMMUNITY): Payer: Medicare Other

## 2021-09-13 DIAGNOSIS — R072 Precordial pain: Secondary | ICD-10-CM | POA: Diagnosis not present

## 2021-09-13 DIAGNOSIS — R9431 Abnormal electrocardiogram [ECG] [EKG]: Secondary | ICD-10-CM | POA: Diagnosis not present

## 2021-09-13 DIAGNOSIS — D6489 Other specified anemias: Secondary | ICD-10-CM | POA: Diagnosis not present

## 2021-09-13 DIAGNOSIS — R079 Chest pain, unspecified: Secondary | ICD-10-CM

## 2021-09-13 DIAGNOSIS — I4819 Other persistent atrial fibrillation: Secondary | ICD-10-CM | POA: Diagnosis not present

## 2021-09-13 DIAGNOSIS — N186 End stage renal disease: Secondary | ICD-10-CM | POA: Diagnosis not present

## 2021-09-13 DIAGNOSIS — I200 Unstable angina: Secondary | ICD-10-CM | POA: Diagnosis not present

## 2021-09-13 DIAGNOSIS — R778 Other specified abnormalities of plasma proteins: Secondary | ICD-10-CM | POA: Diagnosis not present

## 2021-09-13 DIAGNOSIS — I255 Ischemic cardiomyopathy: Secondary | ICD-10-CM | POA: Diagnosis not present

## 2021-09-13 LAB — BPAM RBC
Blood Product Expiration Date: 202210162359
ISSUE DATE / TIME: 202209141201
Unit Type and Rh: 6200

## 2021-09-13 LAB — HEPATIC FUNCTION PANEL
ALT: 18 U/L (ref 0–44)
AST: 14 U/L — ABNORMAL LOW (ref 15–41)
Albumin: 3.5 g/dL (ref 3.5–5.0)
Alkaline Phosphatase: 36 U/L — ABNORMAL LOW (ref 38–126)
Bilirubin, Direct: 0.1 mg/dL (ref 0.0–0.2)
Indirect Bilirubin: 0.5 mg/dL (ref 0.3–0.9)
Total Bilirubin: 0.6 mg/dL (ref 0.3–1.2)
Total Protein: 6 g/dL — ABNORMAL LOW (ref 6.5–8.1)

## 2021-09-13 LAB — GLUCOSE, CAPILLARY
Glucose-Capillary: 91 mg/dL (ref 70–99)
Glucose-Capillary: 79 mg/dL (ref 70–99)
Glucose-Capillary: 117 mg/dL — ABNORMAL HIGH (ref 70–99)
Glucose-Capillary: 104 mg/dL — ABNORMAL HIGH (ref 70–99)

## 2021-09-13 LAB — ECHOCARDIOGRAM COMPLETE
AR max vel: 1.64 cm2
AV Area VTI: 1.76 cm2
AV Area mean vel: 1.6 cm2
AV Mean grad: 6.7 mmHg
AV Peak grad: 12.1 mmHg
Ao pk vel: 1.74 m/s
Area-P 1/2: 2.84 cm2
Calc EF: 46.6 %
Height: 70 in
MV VTI: 1.32 cm2
S' Lateral: 3.3 cm
Single Plane A2C EF: 41.6 %
Single Plane A4C EF: 49 %
Weight: 3305.14 oz

## 2021-09-13 LAB — LIPID PANEL
Cholesterol: 89 mg/dL (ref 0–200)
HDL: 39 mg/dL — ABNORMAL LOW (ref >40)
LDL Cholesterol: 46 mg/dL (ref 0–99)
Total CHOL/HDL Ratio: 2.3 RATIO
Triglycerides: 21 mg/dL (ref <150)
VLDL: 4 mg/dL (ref 0–40)

## 2021-09-13 LAB — RENAL FUNCTION PANEL
Albumin: 3.4 g/dL — ABNORMAL LOW (ref 3.5–5.0)
Anion gap: 9 (ref 5–15)
BUN: 34 mg/dL — ABNORMAL HIGH (ref 6–20)
CO2: 30 mmol/L (ref 22–32)
Calcium: 8.2 mg/dL — ABNORMAL LOW (ref 8.9–10.3)
Chloride: 95 mmol/L — ABNORMAL LOW (ref 98–111)
Creatinine, Ser: 6.9 mg/dL — ABNORMAL HIGH (ref 0.61–1.24)
GFR, Estimated: 9 mL/min — ABNORMAL LOW (ref >60)
Glucose, Bld: 90 mg/dL (ref 70–99)
Phosphorus: 4.7 mg/dL — ABNORMAL HIGH (ref 2.5–4.6)
Potassium: 3.8 mmol/L (ref 3.5–5.1)
Sodium: 134 mmol/L — ABNORMAL LOW (ref 135–145)

## 2021-09-13 LAB — CBC
HCT: 22 % — ABNORMAL LOW (ref 39.0–52.0)
Hemoglobin: 7.1 g/dL — ABNORMAL LOW (ref 13.0–17.0)
MCH: 30.3 pg (ref 26.0–34.0)
MCHC: 32.3 g/dL (ref 30.0–36.0)
MCV: 94 fL (ref 80.0–100.0)
Platelets: 136 10*3/uL — ABNORMAL LOW (ref 150–400)
RBC: 2.34 MIL/uL — ABNORMAL LOW (ref 4.22–5.81)
RDW: 19.4 % — ABNORMAL HIGH (ref 11.5–15.5)
WBC: 4.8 10*3/uL (ref 4.0–10.5)
nRBC: 0.4 % — ABNORMAL HIGH (ref 0.0–0.2)

## 2021-09-13 LAB — TYPE AND SCREEN
ABO/RH(D): AB POS
Antibody Screen: NEGATIVE
Unit division: 0

## 2021-09-13 LAB — TROPONIN I (HIGH SENSITIVITY)
Troponin I (High Sensitivity): 513 ng/L (ref <18)
Troponin I (High Sensitivity): 512 ng/L (ref <18)
Troponin I (High Sensitivity): 600 ng/L (ref <18)

## 2021-09-13 LAB — MRSA NEXT GEN BY PCR, NASAL: MRSA by PCR Next Gen: DETECTED — AB

## 2021-09-13 LAB — HEPATITIS B SURFACE ANTIBODY,QUALITATIVE: Hep B S Ab: REACTIVE — AB

## 2021-09-13 LAB — APTT: aPTT: 124 seconds — ABNORMAL HIGH (ref 24–36)

## 2021-09-13 LAB — MAGNESIUM: Magnesium: 1.8 mg/dL (ref 1.7–2.4)

## 2021-09-13 LAB — HEPARIN LEVEL (UNFRACTIONATED): Heparin Unfractionated: >1.1 IU/mL — ABNORMAL HIGH (ref 0.30–0.70)

## 2021-09-13 MED ORDER — DARBEPOETIN ALFA 60 MCG/0.3ML IJ SOSY
60.0000 ug | PREFILLED_SYRINGE | INTRAMUSCULAR | Status: DC
  Administered 2021-09-14: 60 ug via INTRAVENOUS
  Filled 2021-09-13 (×2): qty 0.3

## 2021-09-13 MED ORDER — MIDODRINE HCL 5 MG PO TABS
10.0000 mg | ORAL_TABLET | Freq: Three times a day (TID) | ORAL | Status: DC
  Administered 2021-09-13 – 2021-09-18 (×14): 10 mg via ORAL
  Filled 2021-09-13 (×15): qty 2

## 2021-09-13 MED ORDER — ISOSORBIDE MONONITRATE ER 30 MG PO TB24
15.0000 mg | ORAL_TABLET | Freq: Every day | ORAL | Status: DC
  Administered 2021-09-13: 15 mg via ORAL
  Filled 2021-09-13: qty 1

## 2021-09-13 MED ORDER — MIDODRINE HCL 5 MG PO TABS: 5.0000 mg | ORAL_TABLET | Freq: Three times a day (TID) | ORAL | Status: DC

## 2021-09-13 MED ORDER — CHLORHEXIDINE GLUCONATE CLOTH 2 % EX PADS
6.0000 | MEDICATED_PAD | Freq: Every day | CUTANEOUS | Status: DC
  Administered 2021-09-13 – 2021-09-15 (×2): 6 via TOPICAL

## 2021-09-13 MED ORDER — MUPIROCIN 2 % EX OINT
1.0000 "application " | TOPICAL_OINTMENT | Freq: Two times a day (BID) | CUTANEOUS | Status: AC
  Administered 2021-09-13 – 2021-09-17 (×9): 1 via NASAL
  Filled 2021-09-13 (×2): qty 22

## 2021-09-13 NOTE — Progress Notes (Signed)
ANTICOAGULATION CONSULT NOTE - Follow Up Consult  Pharmacy Consult for heparin Indication:  Afib and r/o ACS  Labs: Recent Labs    09/12/21 0903 09/12/21 1119 09/12/21 2318 09/13/21 0211 09/13/21 0433  HGB 7.6*  --   --   --   --   HCT 24.6*  --   --   --   --   PLT 188  --   --   --   --   APTT  --   --   --   --  124*  HEPARINUNFRC  --   --   --   --  >1.10*  CREATININE 11.76*  --   --   --  6.90*  TROPONINIHS 98*   < > 513* 512* 600*   < > = values in this interval not displayed.    Assessment: 50yo male supratherapeutic on heparin with initial dosing while Eliquis on hold; no infusion issues or signs of bleeding per RN though she does note that pt was admitted for possible GIB (pt currently refusing Hemoccult testing).  Of note pt has restricted access so labs being drawn from hand on the same side as heparin infusion in [facility], which could contaminate results.  Goal of Therapy:  Heparin level 0.3-0.7 units/ml   Plan:  Will decrease heparin infusion by 2 units/kg/hr to 900 units/hr and check level in 8 hours.    Wynona Neat, PharmD, BCPS  09/13/2021,5:52 AM

## 2021-09-13 NOTE — Progress Notes (Signed)
Subjective:  Denies chest pain.  No abdominal pain.  1 bowel movement last night, did not look at color.  Did not notify staff.  Tolerating diet.  Objective: Vital signs in last 24 hours: Temp:  [97.6 F (36.4 C)-98.5 F (36.9 C)] 97.8 F (36.6 C) (09/15 0707) Pulse Rate:  [30-96] 70 (09/15 0918) Resp:  [12-25] 25 (09/15 0900) BP: (86-123)/(53-97) 109/66 (09/15 0900) SpO2:  [83 %-100 %] 98 % (09/15 0900) Weight:  [93.7 kg-95.2 kg] 93.7 kg (09/14 2311) Last BM Date: 09/10/21 General:   Alert,  Well-developed, well-nourished, pleasant and cooperative in NAD Head:  Normocephalic and atraumatic. Eyes:  Sclera clear, no icterus.  Abdomen:  Soft, nontender and nondistended.  Normal bowel sounds, without guarding, and without rebound.   Extremities:  Without clubbing, deformity or edema. Neurologic:  Alert and  oriented x4;  grossly normal neurologically. Skin:  Intact without significant lesions or rashes. Psych:  Alert and cooperative. Normal mood and affect.  Intake/Output from previous day: 09/14 0701 - 09/15 0700 In: 762.3 [I.V.:101.3; Blood:661] Out: 72  Intake/Output this shift: No intake/output data recorded.  Lab Results: CBC Recent Labs    09/12/21 0903 09/13/21 0433  WBC 8.1 4.8  HGB 7.6* 7.1*  HCT 24.6* 22.0*  MCV 98.4 94.0  PLT 188 136*   BMET Recent Labs    09/12/21 0903 09/13/21 0433  NA 137 134*  K 5.0 3.8  CL 95* 95*  CO2 27 30  GLUCOSE 119* 90  BUN 70* 34*  CREATININE 11.76* 6.90*  CALCIUM 9.3 8.2*   LFTs Recent Labs    09/13/21 0433  BILITOT 0.6  BILIDIR 0.1  IBILI 0.5  ALKPHOS 36*  AST 14*  ALT 18  PROT 6.0*  ALBUMIN 3.5  3.4*   No results for input(s): LIPASE in the last 72 hours. PT/INR No results for input(s): LABPROT, INR in the last 72 hours.    Imaging Studies: DG Chest 2 View  Result Date: 09/12/2021 CLINICAL DATA:  Chest pain. EXAM: CHEST - 2 VIEW COMPARISON:  02/10/2021 FINDINGS: Two views of the chest demonstrate a  right chest single lead ICD. Heart size is within normal limits. Both lungs are clear without pulmonary edema or focal airspace disease. No pleural effusions. Prior median sternotomy. IMPRESSION: 1. No acute cardiopulmonary disease. Electronically Signed   By: Markus Daft M.D.   On: 09/12/2021 09:56  [2 weeks]   Assessment:  50 y/o male with ESRD on HD, Afib on Eliquis, CAD s/p CABG with subsequent documented graft disease, ICD, h/o UGI bleed due to ulcers in setting of ASA/NSAIDs in 2018 presenting with chest pain. GI consulted due to anemia, concern for GI bleed.  Anemia: Hemoglobin 7.6 on presentation.  Per notes, hemoglobin in the 11 range in August at dialysis center.  No documented hemoglobin 11 range back in February 2022.  Patient reports receiving IV iron at times during dialysis.  Last known blood transfusion in 2018 when he was hospitalized with upper GI bleed due to ulcer disease.  Currently denies melena or bright red blood per rectum.  Generally has a bowel movement twice per week.  Hemoccult status unknown, patient refused DRE.  Takes aspirin 81 mg twice daily, Eliquis 2.5 mg twice daily.  No evidence of iron deficiency, B12 deficiency, folate deficiency on current labs.  Received 1 unit of packed red blood cells last night.  Hemoglobin this morning is 7.1.  Patient had a BM last night, did not look at the  color or notify staff.  Currently no chest pain.  High-sensitivity troponin initially 98, up to 600 this morning.  Received heparin overnight but this being discontinued by cardiology given no active chest pain and question of GI bleeding.  Further management per cardiology.  For now, given no overt GI bleeding, endoscopy on hold until he is more stable.  Plan: PPI twice daily for now.  Long-term he will need to stay on once daily dosing indefinitely. Stool Hemoccult.  Patient aware, stool specimen needs to be collected and to notify staff when he has a BM. Consider EGD once deemed  stable from a cardiology standpoint.  Patient refuses colonoscopy. Monitor H/H and transfuse if needed.  Laureen Ochs. Bernarda Caffey [facility] [phone] 9/15/202210:14 AM    LOS: 1 day

## 2021-09-13 NOTE — Progress Notes (Signed)
Critical Care transporting pt to [facility]. Report given to RN assuming care of pt going to 6E-07. Pt denies CP/SOB and VSS at this time. Pt is agreeable to transport, leaving with all possessions and has no other questions at this time.

## 2021-09-13 NOTE — Progress Notes (Signed)
Progress Note  Patient Name: Shaun Burns Date of Encounter: 09/13/2021  Primary Cardiologist: Carlyle Dolly, MD (has been [facility], but patient has difficultly with transportation)  Subjective   No chest pain since yesterday.  Tolerated hemodialysis but UF was limited by hypotension.  Did receive 1 unit PRBCs with treatment.  No obvious bleeding.  Eating breakfast this morning.  No abdominal pain.  Inpatient Medications    Scheduled Meds:  amiodarone  200 mg Oral Daily   aspirin EC  81 mg Oral BID   atorvastatin  40 mg Oral QPM   Chlorhexidine Gluconate Cloth  6 each Topical Q0600   influenza vac split quadrivalent PF  0.5 mL Intramuscular Tomorrow-1000   insulin aspart  0-6 Units Subcutaneous TID WC   metoprolol succinate  50 mg Oral Daily   nitroGLYCERIN  0.5 inch Topical Q8H   pantoprazole  40 mg Oral BID AC   sevelamer carbonate  800 mg Oral TID WC   Continuous Infusions:  sodium chloride     sodium chloride     heparin 900 Units/hr (09/13/21 0556)   PRN Meds: sodium chloride, sodium chloride, acetaminophen **OR** acetaminophen, albuterol, bisacodyl, fentaNYL (SUBLIMAZE) injection, lidocaine (PF), lidocaine-prilocaine, nicotine, nitroGLYCERIN, ondansetron **OR** ondansetron (ZOFRAN) IV, pentafluoroprop-tetrafluoroeth   Vital Signs    Vitals:   09/13/21 0400 09/13/21 0500 09/13/21 0600 09/13/21 0707  BP: (!) 89/61 (!) 89/61 101/60   Pulse: 75 78 84 71  Resp: 15 17 13 19   Temp: 98.2 F (36.8 C)   97.8 F (36.6 C)  TempSrc: Oral   Oral  SpO2: 98% 100% (!) 83% 100%  Weight:      Height:        Intake/Output Summary (Last 24 hours) at 09/13/2021 0855 Last data filed at 09/13/2021 0556 Gross per 24 hour  Intake 762.32 ml  Output 72 ml  Net 690.32 ml   Filed Weights   09/12/21 0850 09/12/21 1750 09/12/21 2311  Weight: 93 kg 95.2 kg 93.7 kg    Telemetry    Rate controlled atrial fibrillation.  Personally reviewed.  ECG    An ECG dated 09/12/2021 was  personally reviewed today and demonstrated:  Atrial fibrillation with more prominent inferolateral ST segment depression.  Physical Exam   GEN: No acute distress.   Neck: No JVD. Cardiac: Irregularly irregular, 1/6 systolic murmur, no gallop. Respiratory: Nonlabored. Clear to auscultation bilaterally. GI: Soft, nontender, bowel sounds present. MS: No edema; left upper extremity AV fistula.. Neuro:  Nonfocal. Psych: Alert and oriented x 3. Normal affect.  Labs    Chemistry Recent Labs  Lab 09/12/21 0903 09/13/21 0433  NA 137 134*  K 5.0 3.8  CL 95* 95*  CO2 27 30  GLUCOSE 119* 90  BUN 70* 34*  CREATININE 11.76* 6.90*  CALCIUM 9.3 8.2*  PROT  --  6.0*  ALBUMIN  --  3.5  3.4*  AST  --  14*  ALT  --  18  ALKPHOS  --  36*  BILITOT  --  0.6  GFRNONAA 5* 9*  ANIONGAP 15 9     Hematology Recent Labs  Lab 09/12/21 0903 09/12/21 1006 09/13/21 0433  WBC 8.1  --  4.8  RBC 2.50* 2.42* 2.34*  HGB 7.6*  --  7.1*  HCT 24.6*  --  22.0*  MCV 98.4  --  94.0  MCH 30.4  --  30.3  MCHC 30.9  --  32.3  RDW 18.3*  --  19.4*  PLT 188  --  136*    Cardiac Enzymes Recent Labs  Lab 09/12/21 1912 09/12/21 2054 09/12/21 2318 09/13/21 0211 09/13/21 0433  TROPONINIHS 458* 537* 513* 512* 600*    Radiology    DG Chest 2 View  Result Date: 09/12/2021 CLINICAL DATA:  Chest pain. EXAM: CHEST - 2 VIEW COMPARISON:  02/10/2021 FINDINGS: Two views of the chest demonstrate a right chest single lead ICD. Heart size is within normal limits. Both lungs are clear without pulmonary edema or focal airspace disease. No pleural effusions. Prior median sternotomy. IMPRESSION: 1. No acute cardiopulmonary disease. Electronically Signed   By: Markus Daft M.D.   On: 09/12/2021 09:56    Cardiac Studies   Cardiac catheterization [facility] 02/12/2021: Findings:  1. Severe diffuse 3v CAD.  2. Occluded RCA with right-to-left and left-to-right collaterals that fill  the distal RCA.  3.  Occluded SVG to RCA.    4. Patent LIMA-D2-LAD with a 99% CTO lesion in the mid LAD after the LIMA  anastomosis  5. Diffuse CAD of a small LCx artery.  6. Unable to pass a wire past the mid LAD 99% stenosis, which has the  characteristics of a CTO.    Echocardiogram [facility] 02/13/2021: Summary    1. The left ventricular systolic function is mildly decreased, LVEF is  visually estimated at 40-45%.    2. The left ventricle is normal in size with moderately to severely  increased wall thickness.    3. The anterolateral wall is hypokinetic.    4. There is mild mitral valve regurgitation.   Assessment & Plan    1.  Unstable angina, high-sensitivity troponin I levels have increased into the 400-600 range, still not a precipitous climb and still could be in range for type II NSTEMI.  ECG does show more prominent ST segment depression compared to tracing from January.  He has been pain-free since yesterday.  2.  Multivessel CAD status post two-vessel CABG in 2019.  He has subsequently documented graft disease and inability to perform PCI of mid LAD CTO (distal to LIMA anastomosis) at [facility] back in February.  3.  Acute on chronic anemia with GI bleed suspected.  Was evaluated by gastroenterology, potentially in need of EGD when more stable.  Received 1 unit PRBC with hemodialysis yesterday, hemoglobin 7.1.  Await stool guaiacs.  He does not report any melena or hematochezia.  4.  ESRD on hemodialysis.  Blood pressure limiting UF.  Nephrology following.  5.  Persistent atrial fibrillation with CHA2DS2-VASc score 4.  Eliquis is presently on hold.  Heart rate controlled on Toprol-XL and has also been on amiodarone as an outpatient potentially for additional heart rate control.  6.  Biotronik ICD in place, implanted at [facility] in February in the setting of sustained VT with syncope.  His last LVEF was 40 to 45% with follow-up echocardiogram pending.  Reviewed with hospitalist  team and nephrology.  Patient awaits bed at [facility] for transfer to hospitalist service and continued subspecialty consultation.  Would recommend stopping heparin at this time particularly in light of concerns about GI bleed and with no active chest pain.  Continue aspirin, Lipitor, Toprol-XL, and amiodarone (at least for now, may want to stop and just focus on heart rate control strategy).  Change nitroglycerin paste to Imdur.  Start midodrine.  Focus at this point will be on optimizing medical therapy from the perspective of ischemic heart disease, although if he continues to have angina  symptoms, a follow-up diagnostic cardiac catheterization would not be unreasonable at least to make sure that there are actually no viable PCI options to consider.  ICD also needs to be interrogated.  Follow-up echocardiogram pending.  Signed, Rozann Lesches, MD  09/13/2021, 8:55 AM

## 2021-09-13 NOTE — Progress Notes (Signed)
PROGRESS NOTE   Shaun Burns  INO:[id number] DOB: 1971-02-28 DOA: 09/12/2021 PCP: Briant Sites, PA-C   Chief Complaint  Patient presents with   Chest Pain   Level of care: Progressive  Brief Admission History:  50 y/o male with ESRD on HD, CAD s/p CABG 2019, ICD placement, type 2 DM, HTN, chronic atrial fibrillation on apixaban, presented to ED from outpatient dialysis center with complaints of ongoing chest pain.  He was noted to have a new anemia Hg down to 7.6 down from 11.  He had been having intermittent black stool but denies any recent black stools.     Assessment & Plan:   Active Problems:   Abnormal electrocardiogram (ECG) (EKG)   ESRD on dialysis (HCC)   Chronic atrial fibrillation (HCC)   Noncompliance with medication regimen   Hypertension   Type 2 diabetes mellitus (HCC)   Tobacco abuse   S/P CABG (coronary artery bypass graft)   CAD (coronary artery disease)   Chest pain   Elevated troponin   Anemia in chronic kidney disease (CKD)   Refuses rectal examination for guiac testing   NSTEMI (non-ST elevated myocardial infarction) ([place])   Chest pain with known CAD - discussed with cardiology team, plan is to transfer patient to [facility] for further subspecialty care. He is off IV heparin now that chest pain has resolved and started on imdur.  His Hs troponin peaked at 600.   Cardiology team to evaluate him at [facility] for need for catheterization.   Type 2 DM  with renal complications - He remains stable on sensitive SSI coverage with frequent CBG monitoring.  A1c is 5.7%.   Hypotension - he has been having a problem with hypotension and started on midodrine by nephrology team.   ESRD on HD - he tolerated HD 9/14.  Nephrology team is following him closely.    Chronic atrial fibrillation - he remains rate controlled, apixaban on hold due to concern for GI bleed.    Acute blood loss anemia - appreciate GI consultation, awaiting for stool hemoccults, pt refused rectal  exam for stool guaiac test.  He remains on protonix for GI protection.   DVT prophylaxis: SCD Code Status: full  Family Communication: discussed plan of care with patient at bedside, he verbalized understanding Disposition: transfer to [facility]  Status is: Inpatient  Remains inpatient appropriate because:Inpatient level of care appropriate due to severity of illness  Dispo: The patient is from: Home              Anticipated d/c is to: Home              Patient currently is not medically stable to d/c.   Difficult to place patient No    Consultants:  Cardiology nephrology  Procedures:  N/a  Antimicrobials:  N/a   Subjective: Pt reports having no further chest pain symptoms, no shortness of breath.   Objective: Vitals:   09/13/21 1113 09/13/21 1122 09/13/21 1143 09/13/21 1200  BP:  109/73  114/70  Pulse: 95 60 78 (!) 55  Resp: 18 18 15 20   Temp: 98.8 F (37.1 C)     TempSrc: Oral Oral    SpO2: 100% 99% 100% 100%  Weight:      Height:        Intake/Output Summary (Last 24 hours) at 09/13/2021 1251 Last data filed at 09/13/2021 0556 Gross per 24 hour  Intake 762.32 ml  Output 72 ml  Net 690.32 ml   Danley Danker  Weights   09/12/21 0850 09/12/21 1750 09/12/21 2311  Weight: 93 kg 95.2 kg 93.7 kg    Examination:  General exam: awake, alert, cooperative, Appears calm and comfortable  Respiratory system: Clear to auscultation. Respiratory effort normal. Cardiovascular system: normal S1 & S2 heard. No JVD, murmurs, rubs, gallops or clicks. No pedal edema. Gastrointestinal system: Abdomen is nondistended, soft and nontender. No organomegaly or masses felt. Normal bowel sounds heard. Central nervous system: Alert and oriented. No focal neurological deficits. Extremities: large bulging AV fistula site left upper arm.  Symmetric 5 x 5 power. Skin: No rashes, lesions or ulcers Psychiatry: Judgement and insight appear normal. Mood & affect appropriate.   Data Reviewed: I have  personally reviewed following labs and imaging studies  CBC: Recent Labs  Lab 09/12/21 0903 09/13/21 0433  WBC 8.1 4.8  HGB 7.6* 7.1*  HCT 24.6* 22.0*  MCV 98.4 94.0  PLT 188 136*    Basic Metabolic Panel: Recent Labs  Lab 09/12/21 0903 09/13/21 0433  NA 137 134*  K 5.0 3.8  CL 95* 95*  CO2 27 30  GLUCOSE 119* 90  BUN 70* 34*  CREATININE 11.76* 6.90*  CALCIUM 9.3 8.2*  MG  --  1.8  PHOS  --  4.7*    GFR: Estimated Creatinine Clearance: 14.9 mL/min (A) (by C-G formula based on SCr of 6.9 mg/dL (H)).  Liver Function Tests: Recent Labs  Lab 09/13/21 0433  AST 14*  ALT 18  ALKPHOS 36*  BILITOT 0.6  PROT 6.0*  ALBUMIN 3.5  3.4*    CBG: Recent Labs  Lab 09/12/21 1152 09/13/21 0709 09/13/21 1112  GLUCAP 73 91 79    Recent Results (from the past 240 hour(s))  Resp Panel by RT-PCR (Flu A&B, Covid) Nasopharyngeal Swab     Status: None   Collection Time: 09/12/21 12:30 PM   Specimen: Nasopharyngeal Swab; Nasopharyngeal(NP) swabs in vial transport medium  Result Value Ref Range Status   SARS Coronavirus 2 by RT PCR NEGATIVE NEGATIVE Final    Comment: (NOTE) SARS-CoV-2 target nucleic acids are NOT DETECTED.  The SARS-CoV-2 RNA is generally detectable in upper respiratory specimens during the acute phase of infection. The lowest concentration of SARS-CoV-2 viral copies this assay can detect is 138 copies/mL. A negative result does not preclude SARS-Cov-2 infection and should not be used as the sole basis for treatment or other patient management decisions. A negative result may occur with  improper specimen collection/handling, submission of specimen other than nasopharyngeal swab, presence of viral mutation(s) within the areas targeted by this assay, and inadequate number of viral copies(<138 copies/mL). A negative result must be combined with clinical observations, patient history, and epidemiological information. The expected result is  Negative.  Fact Sheet for Patients:  EntrepreneurPulse.com.au  Fact Sheet for Healthcare Providers:  IncredibleEmployment.be  This test is no t yet approved or cleared by the [place] FDA and  has been authorized for detection and/or diagnosis of SARS-CoV-2 by FDA under an Emergency Use Authorization (EUA). This EUA will remain  in effect (meaning this test can be used) for the duration of the COVID-19 declaration under Section 564(b)(1) of the Act, 21 U.S.C.section 360bbb-3(b)(1), unless the authorization is terminated  or revoked sooner.       Influenza A by PCR NEGATIVE NEGATIVE Final   Influenza B by PCR NEGATIVE NEGATIVE Final    Comment: (NOTE) The Xpert Xpress SARS-CoV-2/FLU/RSV plus assay is intended as an aid in the diagnosis of influenza from  Nasopharyngeal swab specimens and should not be used as a sole basis for treatment. Nasal washings and aspirates are unacceptable for Xpert Xpress SARS-CoV-2/FLU/RSV testing.  Fact Sheet for Patients: EntrepreneurPulse.com.au  Fact Sheet for Healthcare Providers: IncredibleEmployment.be  This test is not yet approved or cleared by the [place] FDA and has been authorized for detection and/or diagnosis of SARS-CoV-2 by FDA under an Emergency Use Authorization (EUA). This EUA will remain in effect (meaning this test can be used) for the duration of the COVID-19 declaration under Section 564(b)(1) of the Act, 21 U.S.C. section 360bbb-3(b)(1), unless the authorization is terminated or revoked.  Performed at [facility], [address], [place], [place] [address]   MRSA Next Gen by PCR, Nasal     Status: Abnormal   Collection Time: 09/12/21 11:00 PM   Specimen: Nasal Mucosa; Nasal Swab  Result Value Ref Range Status   MRSA by PCR Next Gen DETECTED (A) NOT DETECTED Final    Comment: RESULT CALLED TO, READ BACK BY AND VERIFIED WITH: LOOMIS,K AT  1007 ON 9.15.22 BY RUCINSKI,B (NOTE) The GeneXpert MRSA Assay (FDA approved for NASAL specimens only), is one component of a comprehensive MRSA colonization surveillance program. It is not intended to diagnose MRSA infection nor to guide or monitor treatment for MRSA infections. Test performance is not FDA approved in patients less than 28 years old. Performed at [facility], [address] [place], [place] [address]      Radiology Studies: DG Chest 2 View  Result Date: 09/12/2021 CLINICAL DATA:  Chest pain. EXAM: CHEST - 2 VIEW COMPARISON:  02/10/2021 FINDINGS: Two views of the chest demonstrate a right chest single lead ICD. Heart size is within normal limits. Both lungs are clear without pulmonary edema or focal airspace disease. No pleural effusions. Prior median sternotomy. IMPRESSION: 1. No acute cardiopulmonary disease. Electronically Signed   By: Markus Daft M.D.   On: 09/12/2021 09:56   ECHOCARDIOGRAM COMPLETE  Result Date: 09/13/2021    ECHOCARDIOGRAM REPORT   Patient Name:   Shaun Burns Date of Exam: 09/13/2021 Medical Rec #:  [id number]     Height:       70.0 in Accession #:    [id number]    Weight:       206.6 lb Date of Birth:  1971-02-11      BSA:          2.116 m Patient Age:    89 years      BP:           101/60 mmHg Patient Gender: M             HR:           71 bpm. Exam Location:  [facility] Procedure: 2D Echo, Cardiac Doppler and Color Doppler Indications:    Chest Pain  History:        Patient has prior history of Echocardiogram examinations, most                 recent 10/17/2020. CAD and Previous Myocardial Infarction,                 Defibrillator, Abnormal ECG and Prior CABG, Arrythmias:Atrial                 Fibrillation, Signs/Symptoms:Chest Pain; Risk Factors:Tobacco                 abuse, Hypertension and Diabetes.  Sonographer:    Wenda Low Referring Phys: [phone]  Aracelie Addis L Malaiah Viramontes IMPRESSIONS  1. Left ventricular ejection fraction, by estimation, is 50 to 55%.  The left ventricle has low normal function. The left ventricle demonstrates regional wall motion abnormalities (see scoring diagram/findings for description). There is moderate concentric left ventricular hypertrophy. Left ventricular diastolic parameters are indeterminate. Elevated left ventricular end-diastolic pressure.  2. Right ventricular systolic function is normal. The right ventricular size is normal. There is mildly elevated pulmonary artery systolic pressure. The estimated right ventricular systolic pressure is 16.0 mmHg.  3. Left atrial size was severely dilated.  4. The mitral valve is abnormal. Moderate mitral valve regurgitation. Moderate mitral annular calcification.  5. Tricuspid valve regurgitation is moderate.  6. The aortic valve is tricuspid, calcified with restricted motion of the left coronary cusp. Aortic valve regurgitation is not visualized. Mild aortic valve sclerosis is present, with no evidence of aortic valve stenosis. Aortic valve mean gradient measures 6.7 mmHg.  7. The inferior vena cava is dilated in size with >50% respiratory variability, suggesting right atrial pressure of 8 mmHg. Comparison(s): Prior images reviewed side by side. LVEF similar range. FINDINGS  Left Ventricle: Left ventricular ejection fraction, by estimation, is 50 to 55%. The left ventricle has low normal function. The left ventricle demonstrates regional wall motion abnormalities. The left ventricular internal cavity size was normal in size. There is moderate concentric left ventricular hypertrophy. Left ventricular diastolic parameters are indeterminate. Elevated left ventricular end-diastolic pressure.  LV Wall Scoring: The posterior wall and basal inferior segment are hypokinetic. The entire anterior wall, antero-lateral wall, entire septum, entire apex, and mid and distal inferior wall are normal. Right Ventricle: The right ventricular size is normal. No increase in right ventricular wall thickness. Right  ventricular systolic function is normal. There is mildly elevated pulmonary artery systolic pressure. The tricuspid regurgitant velocity is 2.79  m/s, and with an assumed right atrial pressure of 8 mmHg, the estimated right ventricular systolic pressure is 10.9 mmHg. Left Atrium: Left atrial size was severely dilated. Right Atrium: Right atrial size was normal in size. Pericardium: There is no evidence of pericardial effusion. Mitral Valve: The mitral valve is abnormal. Moderate mitral annular calcification. Moderate mitral valve regurgitation, with posteriorly-directed jet. MV peak gradient, 16.5 mmHg. The mean mitral valve gradient is 6.0 mmHg. Tricuspid Valve: The tricuspid valve is grossly normal. Tricuspid valve regurgitation is moderate. Aortic Valve: The aortic valve is tricuspid. Aortic valve regurgitation is not visualized. Mild aortic valve sclerosis is present, with no evidence of aortic valve stenosis. Aortic valve mean gradient measures 6.7 mmHg. Aortic valve peak gradient measures 12.1 mmHg. Aortic valve area, by VTI measures 1.76 cm. Pulmonic Valve: The pulmonic valve was grossly normal. Pulmonic valve regurgitation is trivial. Aorta: The aortic root is normal in size and structure. Venous: The inferior vena cava is dilated in size with greater than 50% respiratory variability, suggesting right atrial pressure of 8 mmHg. IAS/Shunts: No atrial level shunt detected by color flow Doppler. Additional Comments: A device lead is visualized.  LEFT VENTRICLE PLAX 2D LVIDd:         4.90 cm      Diastology LVIDs:         3.30 cm      LV e' lateral:   11.00 cm/s LV PW:         1.60 cm      LV E/e' lateral: 18.0 LV IVS:        1.50 cm LVOT diam:     2.10 cm LV SV:  57 LV SV Index:   27 LVOT Area:     3.46 cm  LV Volumes (MOD) LV vol d, MOD A2C: 116.0 ml LV vol d, MOD A4C: 130.0 ml LV vol s, MOD A2C: 67.7 ml LV vol s, MOD A4C: 66.3 ml LV SV MOD A2C:     48.3 ml LV SV MOD A4C:     130.0 ml LV SV MOD BP:       58.3 ml RIGHT VENTRICLE RV Basal diam:  4.30 cm RV Mid diam:    3.30 cm RV S prime:     9.00 cm/s TAPSE (M-mode): 1.8 cm LEFT ATRIUM              Index       RIGHT ATRIUM           Index LA diam:        5.00 cm  2.36 cm/m  RA Area:     24.00 cm LA Vol (A2C):   154.0 ml 72.77 ml/m RA Volume:   71.40 ml  33.74 ml/m LA Vol (A4C):   120.0 ml 56.71 ml/m LA Biplane Vol: 142.0 ml 67.10 ml/m  AORTIC VALVE AV Area (Vmax):    1.64 cm AV Area (Vmean):   1.60 cm AV Area (VTI):     1.76 cm AV Vmax:           173.67 cm/s AV Vmean:          120.333 cm/s AV VTI:            0.324 m AV Peak Grad:      12.1 mmHg AV Mean Grad:      6.7 mmHg LVOT Vmax:         82.20 cm/s LVOT Vmean:        55.600 cm/s LVOT VTI:          0.165 m LVOT/AV VTI ratio: 0.51  AORTA Ao Root diam: 3.20 cm MITRAL VALVE                TRICUSPID VALVE MV Area (PHT): 2.84 cm     TR Peak grad:   31.1 mmHg MV Area VTI:   1.32 cm     TR Vmax:        279.00 cm/s MV Peak grad:  16.5 mmHg MV Mean grad:  6.0 mmHg     SHUNTS MV Vmax:       2.03 m/s     Systemic VTI:  0.16 m MV Vmean:      111.0 cm/s   Systemic Diam: 2.10 cm MV Decel Time: 267 msec MV E velocity: 198.00 cm/s Rozann Lesches MD Electronically signed by Rozann Lesches MD Signature Date/Time: 09/13/2021/12:31:34 PM    Final     Scheduled Meds:  amiodarone  200 mg Oral Daily   aspirin EC  81 mg Oral BID   atorvastatin  40 mg Oral QPM   Chlorhexidine Gluconate Cloth  6 each Topical Q0600   Chlorhexidine Gluconate Cloth  6 each Topical Q0600   [START ON 09/14/2021] darbepoetin (ARANESP) injection - DIALYSIS  60 mcg Intravenous Q Fri-HD   influenza vac split quadrivalent PF  0.5 mL Intramuscular Tomorrow-1000   insulin aspart  0-6 Units Subcutaneous TID WC   isosorbide mononitrate  15 mg Oral QHS   metoprolol succinate  50 mg Oral Daily   midodrine  10 mg Oral TID WC   mupirocin ointment  1 application Nasal BID   pantoprazole  40  mg Oral BID AC   sevelamer carbonate  800 mg Oral TID WC    Continuous Infusions:  sodium chloride     sodium chloride      LOS: 1 day   Time spent: 36 mins   Zyshonne Malecha Wynetta Emery, MD How to contact the [facility] Attending or Consulting provider [facility] or covering provider during after hours [place], for this patient?  Check the care team in [facility] and look for a) attending/consulting [facility] provider listed and b) the [facility] team listed Log into [email or web address] and use [place]'s universal password to access. If you do not have the password, please contact the hospital operator. Locate the [facility] provider you are looking for under [facility] and page to a number that you can be directly reached. If you still have difficulty reaching the provider, please page the [facility] (Director on Call) for the Hospitalists listed on amion for assistance.  09/13/2021, 12:51 PM

## 2021-09-13 NOTE — Progress Notes (Signed)
[facility] NEPHROLOGY PROGRESS NOTE  Assessment/ Plan: Pt is a 50 y.o. yo male  with history of hypertension, HLD, DM, CAD status post CABG, ESRD on HD MWF at [facility] presented with chest pain, seen as a consultation for the management of ESRD.   OP HD: [name], MWF, LUE AVG.  #Chest pain/unstable angina: Troponin mildly elevated.  Plan to discontinue IV heparin because of GI bleed.  Cardiology is following, pending echo.  There is starting cardiac medications including Imdur, metoprolol.  Not sure if patient can actually tolerate this medication because of hypotension.   # ESRD: MWF at [facility].  Status post HD on 9/14 for 4 hours, unable to UF because of hypotension despite of lowering dialysate temperature and use of albumin.  I will start midodrine 10 mg 3 times daily.  Plan for next HD tomorrow.  #Hypotension/volume: On cardiac medication for angina.  Midodrine started as above.  UF as tolerated.  Patient reports chronic hypotension as outpatient as well.   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  Received a unit of PRBC on 9/14.  Start ESA with HD.  Monitor hemoglobin and transfuse as needed.   # Metabolic Bone Disease: Calcium and phosphorus level at goal.   Discussed with the cardiology team and the nursing staff.  Subjective: Seen and examined.  Patient denies chest pain, shortness of breath, nausea or vomiting.  No new event. Objective Vital signs in last 24 hours: Vitals:   09/13/21 0707 09/13/21 0800 09/13/21 0900 09/13/21 0918  BP:  (!) 87/55 109/66   Pulse: 71 76 78 70  Resp: 19 (!) 21 (!) 25   Temp: 97.8 F (36.6 C)     TempSrc: Oral     SpO2: 100% 99% 98%   Weight:      Height:       Weight change:   Intake/Output Summary (Last 24 hours) at 09/13/2021 0938 Last data filed at 09/13/2021 0556 Gross per 24 hour  Intake 762.32 ml  Output 72 ml  Net 690.32 ml       Labs: Basic Metabolic Panel: Recent Labs  Lab 09/12/21 0903  09/13/21 0433  NA 137 134*  K 5.0 3.8  CL 95* 95*  CO2 27 30  GLUCOSE 119* 90  BUN 70* 34*  CREATININE 11.76* 6.90*  CALCIUM 9.3 8.2*  PHOS  --  4.7*   Liver Function Tests: Recent Labs  Lab 09/13/21 0433  AST 14*  ALT 18  ALKPHOS 36*  BILITOT 0.6  PROT 6.0*  ALBUMIN 3.5  3.4*   No results for input(s): LIPASE, AMYLASE in the last 168 hours. No results for input(s): AMMONIA in the last 168 hours. CBC: Recent Labs  Lab 09/12/21 0903 09/13/21 0433  WBC 8.1 4.8  HGB 7.6* 7.1*  HCT 24.6* 22.0*  MCV 98.4 94.0  PLT 188 136*   Cardiac Enzymes: No results for input(s): CKTOTAL, CKMB, CKMBINDEX, TROPONINI in the last 168 hours. CBG: Recent Labs  Lab 09/12/21 1152 09/13/21 0709  GLUCAP 73 91    Iron Studies:  Recent Labs    09/12/21 1006  IRON 99  TIBC 246*  FERRITIN 640*   Studies/Results: DG Chest 2 View  Result Date: 09/12/2021 CLINICAL DATA:  Chest pain. EXAM: CHEST - 2 VIEW COMPARISON:  02/10/2021 FINDINGS: Two views of the chest demonstrate a right chest single lead ICD. Heart size is within normal limits. Both lungs are clear without pulmonary edema or focal airspace disease. No  pleural effusions. Prior median sternotomy. IMPRESSION: 1. No acute cardiopulmonary disease. Electronically Signed   By: Markus Daft M.D.   On: 09/12/2021 09:56    Medications: Infusions:  sodium chloride     sodium chloride      Scheduled Medications:  amiodarone  200 mg Oral Daily   aspirin EC  81 mg Oral BID   atorvastatin  40 mg Oral QPM   Chlorhexidine Gluconate Cloth  6 each Topical Q0600   influenza vac split quadrivalent PF  0.5 mL Intramuscular Tomorrow-1000   insulin aspart  0-6 Units Subcutaneous TID WC   isosorbide mononitrate  15 mg Oral QHS   metoprolol succinate  50 mg Oral Daily   midodrine  5 mg Oral TID WC   pantoprazole  40 mg Oral BID AC   sevelamer carbonate  800 mg Oral TID WC    have reviewed scheduled and prn medications.  Physical  Exam: General:NAD, comfortable Heart:RRR, s1s2 nl Lungs:clear b/l, no crackle Abdomen:soft, Non-tender, non-distended Extremities:No edema Dialysis Access: Left AVG has good thrill and bruit.  Shaun Burns Shaun Burns 09/13/2021,9:38 AM  LOS: 1 day

## 2021-09-14 DIAGNOSIS — I25810 Atherosclerosis of coronary artery bypass graft(s) without angina pectoris: Secondary | ICD-10-CM | POA: Diagnosis not present

## 2021-09-14 DIAGNOSIS — R778 Other specified abnormalities of plasma proteins: Secondary | ICD-10-CM

## 2021-09-14 DIAGNOSIS — I10 Essential (primary) hypertension: Secondary | ICD-10-CM | POA: Diagnosis not present

## 2021-09-14 DIAGNOSIS — R079 Chest pain, unspecified: Secondary | ICD-10-CM

## 2021-09-14 DIAGNOSIS — R072 Precordial pain: Secondary | ICD-10-CM

## 2021-09-14 DIAGNOSIS — I214 Non-ST elevation (NSTEMI) myocardial infarction: Secondary | ICD-10-CM

## 2021-09-14 LAB — RENAL FUNCTION PANEL
Albumin: 3.3 g/dL — ABNORMAL LOW (ref 3.5–5.0)
Anion gap: 11 (ref 5–15)
BUN: 47 mg/dL — ABNORMAL HIGH (ref 6–20)
CO2: 29 mmol/L (ref 22–32)
Calcium: 9.3 mg/dL (ref 8.9–10.3)
Chloride: 93 mmol/L — ABNORMAL LOW (ref 98–111)
Creatinine, Ser: 8.88 mg/dL — ABNORMAL HIGH (ref 0.61–1.24)
GFR, Estimated: 7 mL/min — ABNORMAL LOW (ref >60)
Glucose, Bld: 103 mg/dL — ABNORMAL HIGH (ref 70–99)
Phosphorus: 6.1 mg/dL — ABNORMAL HIGH (ref 2.5–4.6)
Potassium: 4.5 mmol/L (ref 3.5–5.1)
Sodium: 133 mmol/L — ABNORMAL LOW (ref 135–145)

## 2021-09-14 LAB — CBC
HCT: 22.9 % — ABNORMAL LOW (ref 39.0–52.0)
HCT: 23 % — ABNORMAL LOW (ref 39.0–52.0)
Hemoglobin: 7.3 g/dL — ABNORMAL LOW (ref 13.0–17.0)
Hemoglobin: 7.4 g/dL — ABNORMAL LOW (ref 13.0–17.0)
MCH: 29.4 pg (ref 26.0–34.0)
MCH: 30.1 pg (ref 26.0–34.0)
MCHC: 31.9 g/dL (ref 30.0–36.0)
MCHC: 32.2 g/dL (ref 30.0–36.0)
MCV: 92.3 fL (ref 80.0–100.0)
MCV: 93.5 fL (ref 80.0–100.0)
Platelets: 161 10*3/uL (ref 150–400)
Platelets: 167 10*3/uL (ref 150–400)
RBC: 2.48 MIL/uL — ABNORMAL LOW (ref 4.22–5.81)
RBC: 2.46 MIL/uL — ABNORMAL LOW (ref 4.22–5.81)
RDW: 18.8 % — ABNORMAL HIGH (ref 11.5–15.5)
RDW: 19 % — ABNORMAL HIGH (ref 11.5–15.5)
WBC: 7.6 10*3/uL (ref 4.0–10.5)
WBC: 7 10*3/uL (ref 4.0–10.5)
nRBC: 0 % (ref 0.0–0.2)
nRBC: 0 % (ref 0.0–0.2)

## 2021-09-14 LAB — GLUCOSE, CAPILLARY
Glucose-Capillary: 92 mg/dL (ref 70–99)
Glucose-Capillary: 115 mg/dL — ABNORMAL HIGH (ref 70–99)
Glucose-Capillary: 136 mg/dL — ABNORMAL HIGH (ref 70–99)

## 2021-09-14 LAB — HEPATITIS B SURFACE ANTIBODY, QUANTITATIVE: Hep B S AB Quant (Post): 14.4 m[IU]/mL (ref >9.9)

## 2021-09-14 MED ORDER — SODIUM CHLORIDE 0.9 % IV SOLN: 100.0000 mL | INTRAVENOUS | Status: DC | PRN

## 2021-09-14 MED ORDER — LIDOCAINE-PRILOCAINE 2.5-2.5 % EX CREA: 1.0000 "application " | TOPICAL_CREAM | CUTANEOUS | Status: DC | PRN

## 2021-09-14 MED ORDER — HYDRALAZINE HCL 25 MG PO TABS
25.0000 mg | ORAL_TABLET | Freq: Three times a day (TID) | ORAL | Status: DC
  Administered 2021-09-14: 25 mg via ORAL
  Filled 2021-09-14: qty 1

## 2021-09-14 MED ORDER — PENTAFLUOROPROP-TETRAFLUOROETH EX AERO: 1.0000 "application " | INHALATION_SPRAY | CUTANEOUS | Status: DC | PRN

## 2021-09-14 MED ORDER — HEPARIN SODIUM (PORCINE) 1000 UNIT/ML DIALYSIS: 1000.0000 [IU] | INTRAMUSCULAR | Status: DC | PRN

## 2021-09-14 MED ORDER — ALTEPLASE 2 MG IJ SOLR: 2.0000 mg | Freq: Once | INTRAMUSCULAR | Status: DC | PRN

## 2021-09-14 MED ORDER — LIDOCAINE HCL (PF) 1 % IJ SOLN: 5.0000 mL | INTRAMUSCULAR | Status: DC | PRN

## 2021-09-14 MED ORDER — SODIUM CHLORIDE 0.9 % IV SOLN: INTRAVENOUS | Status: DC

## 2021-09-14 NOTE — Consult Note (Addendum)
Consultation  Referring Provider: Dr. Verlon Au    Primary Care Physician:  Briant Sites, PA-C Primary Gastroenterologist: [facility]       Reason for Consultation: Anemia            HPI:   Shaun Burns is a 50 y.o. male with a past medical history significant for CAD (echo EF 50-55%), status post CABG in 2014, ICD placement, graft occlusion, end-stage renal disease on hemodialysis, type 2 diabetes, chronic A. fib on apixaban and multiple others, who presented to the hospital with chest pain and initial EKG showed ST depression in the lateral leads as well as rate controlled A. fib.  Cardiology recommended he be transferred to [facility] but there were no beds available so he was admitted to [facility] for observation and troponin testing.  We are called in consult in regards to anemia.    At time of presentation patient complained of chest pain which had been going on for the past week and worsening over the past 24 hours, he had been sent by his dialysis center and he was noted to be a poor historian and described some numbness over the central anterior chest wall.    Today, the patient tells me that he started having chest pain while at dialysis on 9/14.  Explains that he was sent over to the ER and admitted to the hospital.  There he had a dark black stool but he tells me he has been having these at least once every 3 to 4 days for "as long as I can remember", and the "nurses got excited", and tested it and "it was positive for blood".  Explains that he has history of a bleeding ulcer in 2018 which was apparently treated at [facility] but "I have not had any problems with it since then".  Is aware that cardiology thinks his chest pain is related to anemia and that we are unsure why he has anemia.  Does tell me he required blood transfusion at that time as well.    When discussing colonoscopy patient is very hesitant and in fact does not want this procedure done.    Denies NSAID  use, fever, chills, weight loss, change in bowel habits, abdominal pain, heartburn or reflux.  ER course: Hemoglobin 7.6 (from 11), apparently reviewed rectal exam for guaiac testing  GI history: 09/12/2021 consulted by GI at [facility]: GI was called in regards to concern for an upper GI bleed and history of bleeding ulcers 11/26/2017 EGD at [facility] for follow-up after gastric ulcers (op note not available BiPAP with gastric erosions, no H. pylori) 07/22/2017 EGD with 2 gastric ulcers, 1 linear ulcer at [facility], pathology with chronic gastritis and 5.0 purulent exudate consistent with ulcer formation, H. pylori  Past Medical History:  Diagnosis Date   CAD (coronary artery disease)    a. s/p CABG in 2019 with LIMA-LAD-D1 and SVG-PDA b. cath 01/2021 [facility] showing CTO mLAD unable to be crossed with wire, diffusely diseased small circ, LIMA-D2-LAD patent, SVG-RCA occluded, and CTO RCA with L-->R collaterals and medical management was recommended   ESRD on hemodialysis [facility])    Essential hypertension    GERD (gastroesophageal reflux disease)    Headache    History of GI bleed    Ischemic cardiomyopathy    Biotronik single chamber ICD (VDD lead) - February 2022 [facility]   Sustained VT (ventricular tachycardia) [facility])    February 2022   Type 2  diabetes mellitus [facility])     Past Surgical History:  Procedure Laterality Date   A/V FISTULAGRAM N/A 08/13/2018   Procedure: A/V FISTULAGRAM;  Surgeon: Marty Heck, MD;  Location: [place] [facility];  Service: Cardiovascular;  Laterality: N/A;   A/V FISTULAGRAM N/A 01/25/2020   Procedure: A/V FISTULAGRAM - Left Arm;  Surgeon: Serafina Mitchell, MD;  Location: [place] [facility];  Service: Cardiovascular;  Laterality: N/A;   AV FISTULA PLACEMENT Left 03/30/2015   Procedure: LEFT BRACHIOCEPHALIC ARTERIOVENOUS (AV) FISTULA CREATION;  Surgeon: Elam Dutch, MD;  Location: [place];  Service: Vascular;  Laterality: Left;   CORONARY ARTERY BYPASS GRAFT  2019    KNEE SURGERY     PERIPHERAL VASCULAR BALLOON ANGIOPLASTY Left 08/13/2018   Procedure: PERIPHERAL VASCULAR BALLOON ANGIOPLASTY;  Surgeon: Marty Heck, MD;  Location: [place] [facility];  Service: Cardiovascular;  Laterality: Left;  central venous    PERIPHERAL VASCULAR BALLOON ANGIOPLASTY Left 01/25/2020   Procedure: PERIPHERAL VASCULAR BALLOON ANGIOPLASTY;  Surgeon: Serafina Mitchell, MD;  Location: [place] [facility];  Service: Cardiovascular;  Laterality: Left;  arm fistula   PERIPHERAL VASCULAR CATHETERIZATION Left 01/11/2016   Procedure: A/V Shuntogram/Fistulagram;  Surgeon: Conrad Alton, MD;  Location: [place] [facility];  Service: Cardiovascular;  Laterality: Left;    Family History  Problem Relation Age of Onset   Hypertension Father    Stroke Father    Diabetes Mother     Social History   Tobacco Use   Smoking status: Every Day    Packs/day: 0.50    Types: Cigarettes   Smokeless tobacco: Never  Substance Use Topics   Alcohol use: No    Alcohol/week: 0.0 standard drinks   Drug use: No    Prior to Admission medications   Medication Sig Start Date End Date Taking? Authorizing Provider  acetaminophen (TYLENOL) 500 MG tablet Take 1,500 mg by mouth 2 (two) times daily as needed for moderate pain.    Yes [provider]  albuterol (VENTOLIN HFA) 108 (90 Base) MCG/ACT inhaler Inhale 2 puffs into the lungs as needed for wheezing or shortness of breath.   Yes [provider]  amiodarone (PACERONE) 200 MG tablet Take 200 mg by mouth daily.   Yes [provider]  apixaban (ELIQUIS) 5 MG TABS tablet Take 1 tablet (5 mg total) by mouth 2 (two) times daily. Patient taking differently: Take 2.5 mg by mouth 2 (two) times daily. 10/19/20  Yes TatShanon Brow, MD  aspirin EC 81 MG tablet Take 81 mg by mouth 2 (two) times daily.  07/08/18  Yes [provider]  atorvastatin (LIPITOR) 40 MG tablet Take 40 mg by mouth every evening.   Yes [provider]  metoprolol succinate (TOPROL-XL) 50 MG 24 hr tablet Take 50 mg by mouth daily. 07/22/21  Yes [provider]  sevelamer carbonate (RENVELA) 800 MG tablet Take 800 mg by mouth in the morning and at bedtime. 10/12/20  Yes [provider]  carvedilol (COREG) 12.5 MG tablet Take 12.5 mg by mouth daily at 6 (six) AM. Patient not taking: Reported on 09/12/2021 10/12/20   [provider]  diltiazem (CARDIZEM CD) 180 MG 24 hr capsule Take 1 capsule (180 mg total) by mouth daily. Patient not taking: No sig reported 10/19/20   Orson Eva, MD  isosorbide mononitrate (IMDUR) 60 MG 24 hr tablet Take 60 mg by mouth daily. Patient not taking: Reported on 09/12/2021 08/24/20  [provider]    Current Facility-Administered Medications  Medication Dose Route Frequency Provider Last Rate Last Admin   0.9 %  sodium chloride infusion  100 mL Intravenous PRN Rosita Fire, MD       0.9 %  sodium chloride infusion  100 mL Intravenous PRN Rosita Fire, MD       acetaminophen (TYLENOL) tablet 650 mg  650 mg Oral Q6H PRN Johnson, Clanford L, MD       Or   acetaminophen (TYLENOL) suppository 650 mg  650 mg Rectal Q6H PRN Johnson, Clanford L, MD       albuterol (VENTOLIN HFA) 108 (90 Base) MCG/ACT inhaler 2 puff  2 puff Inhalation PRN Johnson, Clanford L, MD       amiodarone (PACERONE) tablet 200 mg  200 mg Oral Daily Johnson, Clanford L, MD   200 mg at 09/13/21 0920   atorvastatin (LIPITOR) tablet 40 mg  40 mg Oral QPM Johnson, Clanford L, MD   40 mg at 09/13/21 1647   bisacodyl (DULCOLAX) EC tablet 5 mg  5 mg Oral Daily PRN Wynetta Emery, Clanford L, MD       Chlorhexidine Gluconate Cloth 2 % PADS 6 each  6 each Topical Q0600 Rosita Fire, MD   6 each at 09/13/21 1940   Chlorhexidine Gluconate Cloth 2 % PADS 6 each  6 each Topical Q0600 Rosita Fire, MD   6 each at 09/13/21 1001   Darbepoetin Alfa (ARANESP) injection 60 mcg  60 mcg  Intravenous Q Fri-HD Rosita Fire, MD       fentaNYL (SUBLIMAZE) injection 25 mcg  25 mcg Intravenous Q2H PRN Johnson, Clanford L, MD       hydrALAZINE (APRESOLINE) tablet 25 mg  25 mg Oral Q8H O'Neal, Cassie Freer, MD       influenza vac split quadrivalent PF (FLUARIX) injection 0.5 mL  0.5 mL Intramuscular Tomorrow-1000 Johnson, Clanford L, MD       insulin aspart (novoLOG) injection 0-6 Units  0-6 Units Subcutaneous TID WC Johnson, Clanford L, MD       isosorbide mononitrate (IMDUR) 24 hr tablet 15 mg  15 mg Oral QHS Satira Sark, MD   15 mg at 09/13/21 2121   lidocaine (PF) (XYLOCAINE) 1 % injection 5 mL  5 mL Intradermal PRN Rosita Fire, MD       lidocaine-prilocaine (EMLA) cream 1 application  1 application Topical PRN Rosita Fire, MD       metoprolol succinate (TOPROL-XL) 24 hr tablet 50 mg  50 mg Oral Daily Johnson, Clanford L, MD   50 mg at 09/13/21 0918   midodrine (PROAMATINE) tablet 10 mg  10 mg Oral TID WC Rosita Fire, MD   10 mg at 09/14/21 1214   mupirocin ointment (BACTROBAN) 2 % 1 application  1 application Nasal BID Wynetta Emery, Clanford L, MD   1 application at 16/10/96 0905   nicotine (NICODERM CQ - dosed in mg/24 hours) patch 14 mg  14 mg Transdermal Daily PRN Johnson, Clanford L, MD       nitroGLYCERIN (NITROSTAT) SL tablet 0.4 mg  0.4 mg Sublingual Q5 min PRN Johnson, Clanford L, MD       ondansetron (ZOFRAN) tablet 4 mg  4 mg Oral Q6H PRN Johnson, Clanford L, MD       Or   ondansetron (ZOFRAN) injection 4 mg  4 mg Intravenous Q6H PRN Murlean Iba, MD  pantoprazole (PROTONIX) EC tablet 40 mg  40 mg Oral BID AC Mahala Menghini, PA-C   40 mg at 09/13/21 1647   pentafluoroprop-tetrafluoroeth (GEBAUERS) aerosol 1 application  1 application Topical PRN Rosita Fire, MD       sevelamer carbonate (RENVELA) tablet 800 mg  800 mg Oral TID WC Johnson, Clanford L, MD   800 mg at 09/14/21 1214    Allergies as of 09/12/2021    (No Known Allergies)     Review of Systems:    Constitutional: No weight loss, fever or chills Skin: No rash  Cardiovascular: No palpitations Respiratory: No SOB Gastrointestinal: See HPI and otherwise negative Genitourinary: No dysuria  Neurological: No headache, dizziness or syncope Musculoskeletal: No new muscle or joint pain Hematologic: No bruising Psychiatric: No history of depression or anxiety     Physical Exam:  Vital signs in last 24 hours: Temp:  [98.1 F (36.7 C)-98.7 F (37.1 C)] 98.6 F (37 C) (09/16 1143) Pulse Rate:  [65-88] 76 (09/16 1143) Resp:  [15-22] 18 (09/16 1143) BP: (104-131)/(56-92) 124/77 (09/16 1143) SpO2:  [99 %-100 %] 99 % (09/16 0505) Weight:  [95 kg] 95 kg (09/15 1909) Last BM Date: 09/13/21 General:   AA male appears to be in NAD, Well developed, Well nourished, alert and cooperative Head:  Normocephalic and atraumatic. Eyes:   PEERL, EOMI. No icterus. Conjunctiva pink. Ears:  Normal auditory acuity. Neck:  Supple Throat: Oral cavity and pharynx without inflammation, swelling or lesion. Teeth in good condition. Lungs: Respirations even and unlabored. Lungs clear to auscultation bilaterally.   No wheezes, crackles, or rhonchi.  Heart: Normal S1, S2.  Systolic murmur.  Irregularly irregular no peripheral edema, cyanosis or pallor.  Abdomen:  Soft, nondistended, nontender. No rebound or guarding. Normal bowel sounds. No appreciable masses or hepatomegaly. Rectal: Declined Msk:  Symmetrical without gross deformities. Peripheral pulses intact.  Extremities:  Without edema, no deformity or joint abnormality. Normal ROM, normal sensation. Neurologic:  Alert and  oriented x4;  grossly normal neurologically.  Skin:   Dry and intact without significant lesions or rashes. Psychiatric: Demonstrates good judgement and reason without abnormal affect or behaviors.   LAB RESULTS: Recent Labs    09/13/21 0433 09/14/21 0140 09/14/21 1112  WBC 4.8  7.6 7.0  HGB 7.1* 7.3* 7.4*  HCT 22.0* 22.9* 23.0*  PLT 136* 161 167   BMET Recent Labs    09/12/21 0903 09/13/21 0433 09/14/21 0140  NA 137 134* 133*  K 5.0 3.8 4.5  CL 95* 95* 93*  CO2 27 30 29   GLUCOSE 119* 90 103*  BUN 70* 34* 47*  CREATININE 11.76* 6.90* 8.88*  CALCIUM 9.3 8.2* 9.3   LFT Recent Labs    09/13/21 0433 09/14/21 0140  PROT 6.0*  --   ALBUMIN 3.5  3.4* 3.3*  AST 14*  --   ALT 18  --   ALKPHOS 36*  --   BILITOT 0.6  --   BILIDIR 0.1  --   IBILI 0.5  --     STUDIES: ECHOCARDIOGRAM COMPLETE  Result Date: 09/13/2021    ECHOCARDIOGRAM REPORT   Patient Name:   Shaun Burns Date of Exam: 09/13/2021 Medical Rec #:  [id number]     Height:       70.0 in Accession #:    [id number]    Weight:       206.6 lb Date of Birth:  [birth year]      BSA:  2.116 m Patient Age:    65 years      BP:           101/60 mmHg Patient Gender: M             HR:           71 bpm. Exam Location:  [facility] Procedure: 2D Echo, Cardiac Doppler and Color Doppler Indications:    Chest Pain  History:        Patient has prior history of Echocardiogram examinations, most                 recent 10/17/2020. CAD and Previous Myocardial Infarction,                 Defibrillator, Abnormal ECG and Prior CABG, Arrythmias:Atrial                 Fibrillation, Signs/Symptoms:Chest Pain; Risk Factors:Tobacco                 abuse, Hypertension and Diabetes.  Sonographer:    Wenda Low Referring Phys: [place]  1. Left ventricular ejection fraction, by estimation, is 50 to 55%. The left ventricle has low normal function. The left ventricle demonstrates regional wall motion abnormalities (see scoring diagram/findings for description). There is moderate concentric left ventricular hypertrophy. Left ventricular diastolic parameters are indeterminate. Elevated left ventricular end-diastolic pressure.  2. Right ventricular systolic function is normal. The right ventricular size  is normal. There is mildly elevated pulmonary artery systolic pressure. The estimated right ventricular systolic pressure is 81.0 mmHg.  3. Left atrial size was severely dilated.  4. The mitral valve is abnormal. Moderate mitral valve regurgitation. Moderate mitral annular calcification.  5. Tricuspid valve regurgitation is moderate.  6. The aortic valve is tricuspid, calcified with restricted motion of the left coronary cusp. Aortic valve regurgitation is not visualized. Mild aortic valve sclerosis is present, with no evidence of aortic valve stenosis. Aortic valve mean gradient measures 6.7 mmHg.  7. The inferior vena cava is dilated in size with >50% respiratory variability, suggesting right atrial pressure of 8 mmHg. Comparison(s): Prior images reviewed side by side. LVEF similar range. FINDINGS  Left Ventricle: Left ventricular ejection fraction, by estimation, is 50 to 55%. The left ventricle has low normal function. The left ventricle demonstrates regional wall motion abnormalities. The left ventricular internal cavity size was normal in size. There is moderate concentric left ventricular hypertrophy. Left ventricular diastolic parameters are indeterminate. Elevated left ventricular end-diastolic pressure.  LV Wall Scoring: The posterior wall and basal inferior segment are hypokinetic. The entire anterior wall, antero-lateral wall, entire septum, entire apex, and mid and distal inferior wall are normal. Right Ventricle: The right ventricular size is normal. No increase in right ventricular wall thickness. Right ventricular systolic function is normal. There is mildly elevated pulmonary artery systolic pressure. The tricuspid regurgitant velocity is 2.79  m/s, and with an assumed right atrial pressure of 8 mmHg, the estimated right ventricular systolic pressure is 17.5 mmHg. Left Atrium: Left atrial size was severely dilated. Right Atrium: Right atrial size was normal in size. Pericardium: There is no evidence  of pericardial effusion. Mitral Valve: The mitral valve is abnormal. Moderate mitral annular calcification. Moderate mitral valve regurgitation, with posteriorly-directed jet. MV peak gradient, 16.5 mmHg. The mean mitral valve gradient is 6.0 mmHg. Tricuspid Valve: The tricuspid valve is grossly normal. Tricuspid valve regurgitation is moderate. Aortic Valve: The aortic valve is tricuspid. Aortic valve  regurgitation is not visualized. Mild aortic valve sclerosis is present, with no evidence of aortic valve stenosis. Aortic valve mean gradient measures 6.7 mmHg. Aortic valve peak gradient measures 12.1 mmHg. Aortic valve area, by VTI measures 1.76 cm. Pulmonic Valve: The pulmonic valve was grossly normal. Pulmonic valve regurgitation is trivial. Aorta: The aortic root is normal in size and structure. Venous: The inferior vena cava is dilated in size with greater than 50% respiratory variability, suggesting right atrial pressure of 8 mmHg. IAS/Shunts: No atrial level shunt detected by color flow Doppler. Additional Comments: A device lead is visualized.  LEFT VENTRICLE PLAX 2D LVIDd:         4.90 cm      Diastology LVIDs:         3.30 cm      LV e' lateral:   11.00 cm/s LV PW:         1.60 cm      LV E/e' lateral: 18.0 LV IVS:        1.50 cm LVOT diam:     2.10 cm LV SV:         57 LV SV Index:   27 LVOT Area:     3.46 cm  LV Volumes (MOD) LV vol d, MOD A2C: 116.0 ml LV vol d, MOD A4C: 130.0 ml LV vol s, MOD A2C: 67.7 ml LV vol s, MOD A4C: 66.3 ml LV SV MOD A2C:     48.3 ml LV SV MOD A4C:     130.0 ml LV SV MOD BP:      58.3 ml RIGHT VENTRICLE RV Basal diam:  4.30 cm RV Mid diam:    3.30 cm RV S prime:     9.00 cm/s TAPSE (M-mode): 1.8 cm LEFT ATRIUM              Index       RIGHT ATRIUM           Index LA diam:        5.00 cm  2.36 cm/m  RA Area:     24.00 cm LA Vol (A2C):   154.0 ml 72.77 ml/m RA Volume:   71.40 ml  33.74 ml/m LA Vol (A4C):   120.0 ml 56.71 ml/m LA Biplane Vol: 142.0 ml 67.10 ml/m  AORTIC  VALVE AV Area (Vmax):    1.64 cm AV Area (Vmean):   1.60 cm AV Area (VTI):     1.76 cm AV Vmax:           173.67 cm/s AV Vmean:          120.333 cm/s AV VTI:            0.324 m AV Peak Grad:      12.1 mmHg AV Mean Grad:      6.7 mmHg LVOT Vmax:         82.20 cm/s LVOT Vmean:        55.600 cm/s LVOT VTI:          0.165 m LVOT/AV VTI ratio: 0.51  AORTA Ao Root diam: 3.20 cm MITRAL VALVE                TRICUSPID VALVE MV Area (PHT): 2.84 cm     TR Peak grad:   31.1 mmHg MV Area VTI:   1.32 cm     TR Vmax:        279.00 cm/s MV Peak grad:  16.5 mmHg MV Mean grad:  6.0  mmHg     SHUNTS MV Vmax:       2.03 m/s     Systemic VTI:  0.16 m MV Vmean:      111.0 cm/s   Systemic Diam: 2.10 cm MV Decel Time: 267 msec MV E velocity: 198.00 cm/s Rozann Lesches MD Electronically signed by Rozann Lesches MD Signature Date/Time: 09/13/2021/12:31:34 PM    Final      Impression / Plan:   Impression: 1.  Anemia: Likely multifactorial with ESRD and suspected GI bleed, iron saturation low at 40%, received 1 unit of PRBCs on 1/14, hemoglobin 11.3 on 01/01/2021 --> 7.6 on 09/12/2021 --> 7.1 --> 7.3--> 7.4, started on ESA with HD, seen by Dr. Gala Romney at [facility] yesterday, patient had a small black stool on 9/15 which appeared Hemoccult positive per nursing staff, their plan was to proceed with cardiology evaluation and then GI evaluation and continue on a PPI, history of gastric ulcers in 2018, reports of melena now; likely repeat upper GI bleeding 2.  Chronic A. fib: Eliquis on hold 3.  ESRD on HD 4.  Type 2 diabetes 5.  Chest pain: 09/16/2021 echo with LVEF 50-55%, no evidence of aortic valve stenosis, troponin mildly elevated, initially started on IV heparin but this was discontinued due to GI bleed, cardiology fine following, per cardiology his chest pain.  Strictly demand related with strong type to his anemia, no plans for invasive angiography this admission, medical management was deemed the best option  Plan: 1.  Patient  will benefit from EGD for further evaluation of anemia and reported melena.  With history of upper GI bleeding and gastric ulcers in the past, likely the same now.  Patient will be scheduled for tomorrow with Dr. Lorenso Courier.  Did discuss risks, benefits, limitations and alternatives with patient and he agrees to proceed. 2.  Did briefly discuss a colonoscopy, patient is adamant that he would not like to have this procedure done.  Explained that if EGD is unrevealing we may need to rediscuss. 3.  Continue to monitor hemoglobin with transfusion as needed less than 7 4.  Continue to hold Eliquis and Heparin 5.  Agree with Pantoprazole 40 mg twice daily  Thank you for your kind consultation, we will continue to follow.  Shaun Burns  09/14/2021, 1:00 PM  Addendum:  Patient has now been scheduled for EGD on Sunday due to Endo scheduling.  He can be on heart healthy diet today and tomorrow and then NPO at midnight on Sunday morning.  JLL

## 2021-09-14 NOTE — Progress Notes (Addendum)
Progress Note  Patient Name: Shaun Burns Date of Encounter: 09/14/2021  [facility] Cardiologist: Carlyle Dolly, MD [facility]   Subjective   He confirmed that he had left sided chest pain with exertional activity over the past 2 weeks, radiating to left arm causing numbness. He has been chest pain free for 24 hours. He is feeling well. He is oliguric at baseline.   Inpatient Medications    Scheduled Meds:  amiodarone  200 mg Oral Daily   aspirin EC  81 mg Oral BID   atorvastatin  40 mg Oral QPM   Chlorhexidine Gluconate Cloth  6 each Topical Q0600   Chlorhexidine Gluconate Cloth  6 each Topical Q0600   darbepoetin (ARANESP) injection - DIALYSIS  60 mcg Intravenous Q Fri-HD   influenza vac split quadrivalent PF  0.5 mL Intramuscular Tomorrow-1000   insulin aspart  0-6 Units Subcutaneous TID WC   isosorbide mononitrate  15 mg Oral QHS   metoprolol succinate  50 mg Oral Daily   midodrine  10 mg Oral TID WC   mupirocin ointment  1 application Nasal BID   pantoprazole  40 mg Oral BID AC   sevelamer carbonate  800 mg Oral TID WC   Continuous Infusions:  sodium chloride     sodium chloride     PRN Meds: sodium chloride, sodium chloride, acetaminophen **OR** acetaminophen, albuterol, bisacodyl, fentaNYL (SUBLIMAZE) injection, lidocaine (PF), lidocaine-prilocaine, nicotine, nitroGLYCERIN, ondansetron **OR** ondansetron (ZOFRAN) IV, pentafluoroprop-tetrafluoroeth   Vital Signs    Vitals:   09/14/21 0011 09/14/21 0505 09/14/21 0749 09/14/21 0810  BP: 105/67 (!) 104/56 117/83 122/89  Pulse: 77 73 78   Resp: 19 20 18    Temp: 98.5 F (36.9 C) 98.5 F (36.9 C) 98.5 F (36.9 C)   TempSrc: Oral Oral Oral   SpO2: 100% 99%    Weight:      Height:        Intake/Output Summary (Last 24 hours) at 09/14/2021 0930 Last data filed at 09/14/2021 0900 Gross per 24 hour  Intake 120 ml  Output --  Net 120 ml   Last 3 Weights 09/13/2021 09/12/2021 09/12/2021  Weight (lbs)  209 lb 6.4 oz 206 lb 9.1 oz 209 lb 14.1 oz  Weight (kg) 94.983 kg 93.7 kg 95.2 kg      Telemetry    Persistent A fib, rate of 70-80s, intermittently ventricular paced - Personally Reviewed  ECG    N/A this AM - Personally Reviewed  Physical Exam   GEN: No acute distress.   Neck: No JVD Cardiac: Irregularly irregular, systolic murmur grade III LSB Respiratory: Clear to auscultation bilaterally. On room air.  GI: Soft, nontender, non-distended  MS: No BLE edema; No deformity. Neuro:  Nonfocal  Psych: Normal affect   Labs    High Sensitivity Troponin:   Recent Labs  Lab 09/12/21 1912 09/12/21 2054 09/12/21 2318 09/13/21 0211 09/13/21 0433  TROPONINIHS 458* 537* 513* 512* 600*     Chemistry Recent Labs  Lab 09/12/21 0903 09/13/21 0433 09/14/21 0140  NA 137 134* 133*  K 5.0 3.8 4.5  CL 95* 95* 93*  CO2 27 30 29   GLUCOSE 119* 90 103*  BUN 70* 34* 47*  CREATININE 11.76* 6.90* 8.88*  CALCIUM 9.3 8.2* 9.3  MG  --  1.8  --   PROT  --  6.0*  --   ALBUMIN  --  3.5  3.4* 3.3*  AST  --  14*  --   ALT  --  18  --   ALKPHOS  --  36*  --   BILITOT  --  0.6  --   GFRNONAA 5* 9* 7*  ANIONGAP 15 9 11     Lipids  Recent Labs  Lab 09/13/21 0433  CHOL 89  TRIG 21  HDL 39*  LDLCALC 46  CHOLHDL 2.3    Hematology Recent Labs  Lab 09/12/21 0903 09/12/21 1006 09/13/21 0433 09/14/21 0140  WBC 8.1  --  4.8 7.6  RBC 2.50* 2.42* 2.34* 2.48*  HGB 7.6*  --  7.1* 7.3*  HCT 24.6*  --  22.0* 22.9*  MCV 98.4  --  94.0 92.3  MCH 30.4  --  30.3 29.4  MCHC 30.9  --  32.3 31.9  RDW 18.3*  --  19.4* 18.8*  PLT 188  --  136* 161   Thyroid No results for input(s): TSH, FREET4 in the last 168 hours.  BNPNo results for input(s): BNP, PROBNP in the last 168 hours.  DDimer No results for input(s): DDIMER in the last 168 hours.   Radiology    DG Chest 2 View  Result Date: 09/12/2021 CLINICAL DATA:  Chest pain. EXAM: CHEST - 2 VIEW COMPARISON:  02/10/2021 FINDINGS: Two  views of the chest demonstrate a right chest single lead ICD. Heart size is within normal limits. Both lungs are clear without pulmonary edema or focal airspace disease. No pleural effusions. Prior median sternotomy. IMPRESSION: 1. No acute cardiopulmonary disease. Electronically Signed   By: Markus Daft M.D.   On: 09/12/2021 09:56   ECHOCARDIOGRAM COMPLETE  Result Date: 09/13/2021    ECHOCARDIOGRAM REPORT   Patient Name:   Shaun Burns Date of Exam: 09/13/2021 Medical Rec #:  [id number]     Height:       70.0 in Accession #:    [id number]    Weight:       206.6 lb Date of Birth:  10-10-1971      BSA:          2.116 m Patient Age:    50 years      BP:           101/60 mmHg Patient Gender: M             HR:           71 bpm. Exam Location:  [facility] Procedure: 2D Echo, Cardiac Doppler and Color Doppler Indications:    Chest Pain  History:        Patient has prior history of Echocardiogram examinations, most                 recent 10/17/2020. CAD and Previous Myocardial Infarction,                 Defibrillator, Abnormal ECG and Prior CABG, Arrythmias:Atrial                 Fibrillation, Signs/Symptoms:Chest Pain; Risk Factors:Tobacco                 abuse, Hypertension and Diabetes.  Sonographer:    Wenda Low Referring Phys: [place]  1. Left ventricular ejection fraction, by estimation, is 50 to 55%. The left ventricle has low normal function. The left ventricle demonstrates regional wall motion abnormalities (see scoring diagram/findings for description). There is moderate concentric left ventricular hypertrophy. Left ventricular diastolic parameters are indeterminate. Elevated left ventricular end-diastolic pressure.  2. Right ventricular systolic function is normal. The right ventricular  size is normal. There is mildly elevated pulmonary artery systolic pressure. The estimated right ventricular systolic pressure is 28.4 mmHg.  3. Left atrial size was severely dilated.  4. The  mitral valve is abnormal. Moderate mitral valve regurgitation. Moderate mitral annular calcification.  5. Tricuspid valve regurgitation is moderate.  6. The aortic valve is tricuspid, calcified with restricted motion of the left coronary cusp. Aortic valve regurgitation is not visualized. Mild aortic valve sclerosis is present, with no evidence of aortic valve stenosis. Aortic valve mean gradient measures 6.7 mmHg.  7. The inferior vena cava is dilated in size with >50% respiratory variability, suggesting right atrial pressure of 8 mmHg. Comparison(s): Prior images reviewed side by side. LVEF similar range. FINDINGS  Left Ventricle: Left ventricular ejection fraction, by estimation, is 50 to 55%. The left ventricle has low normal function. The left ventricle demonstrates regional wall motion abnormalities. The left ventricular internal cavity size was normal in size. There is moderate concentric left ventricular hypertrophy. Left ventricular diastolic parameters are indeterminate. Elevated left ventricular end-diastolic pressure.  LV Wall Scoring: The posterior wall and basal inferior segment are hypokinetic. The entire anterior wall, antero-lateral wall, entire septum, entire apex, and mid and distal inferior wall are normal. Right Ventricle: The right ventricular size is normal. No increase in right ventricular wall thickness. Right ventricular systolic function is normal. There is mildly elevated pulmonary artery systolic pressure. The tricuspid regurgitant velocity is 2.79  m/s, and with an assumed right atrial pressure of 8 mmHg, the estimated right ventricular systolic pressure is 13.2 mmHg. Left Atrium: Left atrial size was severely dilated. Right Atrium: Right atrial size was normal in size. Pericardium: There is no evidence of pericardial effusion. Mitral Valve: The mitral valve is abnormal. Moderate mitral annular calcification. Moderate mitral valve regurgitation, with posteriorly-directed jet. MV peak  gradient, 16.5 mmHg. The mean mitral valve gradient is 6.0 mmHg. Tricuspid Valve: The tricuspid valve is grossly normal. Tricuspid valve regurgitation is moderate. Aortic Valve: The aortic valve is tricuspid. Aortic valve regurgitation is not visualized. Mild aortic valve sclerosis is present, with no evidence of aortic valve stenosis. Aortic valve mean gradient measures 6.7 mmHg. Aortic valve peak gradient measures 12.1 mmHg. Aortic valve area, by VTI measures 1.76 cm. Pulmonic Valve: The pulmonic valve was grossly normal. Pulmonic valve regurgitation is trivial. Aorta: The aortic root is normal in size and structure. Venous: The inferior vena cava is dilated in size with greater than 50% respiratory variability, suggesting right atrial pressure of 8 mmHg. IAS/Shunts: No atrial level shunt detected by color flow Doppler. Additional Comments: A device lead is visualized.  LEFT VENTRICLE PLAX 2D LVIDd:         4.90 cm      Diastology LVIDs:         3.30 cm      LV e' lateral:   11.00 cm/s LV PW:         1.60 cm      LV E/e' lateral: 18.0 LV IVS:        1.50 cm LVOT diam:     2.10 cm LV SV:         57 LV SV Index:   27 LVOT Area:     3.46 cm  LV Volumes (MOD) LV vol d, MOD A2C: 116.0 ml LV vol d, MOD A4C: 130.0 ml LV vol s, MOD A2C: 67.7 ml LV vol s, MOD A4C: 66.3 ml LV SV MOD A2C:     48.3 ml  LV SV MOD A4C:     130.0 ml LV SV MOD BP:      58.3 ml RIGHT VENTRICLE RV Basal diam:  4.30 cm RV Mid diam:    3.30 cm RV S prime:     9.00 cm/s TAPSE (M-mode): 1.8 cm LEFT ATRIUM              Index       RIGHT ATRIUM           Index LA diam:        5.00 cm  2.36 cm/m  RA Area:     24.00 cm LA Vol (A2C):   154.0 ml 72.77 ml/m RA Volume:   71.40 ml  33.74 ml/m LA Vol (A4C):   120.0 ml 56.71 ml/m LA Biplane Vol: 142.0 ml 67.10 ml/m  AORTIC VALVE AV Area (Vmax):    1.64 cm AV Area (Vmean):   1.60 cm AV Area (VTI):     1.76 cm AV Vmax:           173.67 cm/s AV Vmean:          120.333 cm/s AV VTI:            0.324 m AV  Peak Grad:      12.1 mmHg AV Mean Grad:      6.7 mmHg LVOT Vmax:         82.20 cm/s LVOT Vmean:        55.600 cm/s LVOT VTI:          0.165 m LVOT/AV VTI ratio: 0.51  AORTA Ao Root diam: 3.20 cm MITRAL VALVE                TRICUSPID VALVE MV Area (PHT): 2.84 cm     TR Peak grad:   31.1 mmHg MV Area VTI:   1.32 cm     TR Vmax:        279.00 cm/s MV Peak grad:  16.5 mmHg MV Mean grad:  6.0 mmHg     SHUNTS MV Vmax:       2.03 m/s     Systemic VTI:  0.16 m MV Vmean:      111.0 cm/s   Systemic Diam: 2.10 cm MV Decel Time: 267 msec MV E velocity: 198.00 cm/s Rozann Lesches MD Electronically signed by Rozann Lesches MD Signature Date/Time: 09/13/2021/12:31:34 PM    Final     Cardiac Studies   Cardiac catheterization [facility] 02/12/2021: Findings:  1. Severe diffuse 3v CAD.  2. Occluded RCA with right-to-left and left-to-right collaterals that fill  the distal RCA.  3. Occluded SVG to RCA.    4. Patent LIMA-D2-LAD with a 99% CTO lesion in the mid LAD after the LIMA  anastomosis  5. Diffuse CAD of a small LCx artery.  6. Unable to pass a wire past the mid LAD 99% stenosis, which has the  characteristics of a CTO.    Echocardiogram [facility] 02/13/2021: Summary    1. The left ventricular systolic function is mildly decreased, LVEF is  visually estimated at 40-45%.    2. The left ventricle is normal in size with moderately to severely  increased wall thickness.    3. The anterolateral wall is hypokinetic.    4. There is mild mitral valve regurgitation.     Patient Profile     50 y.o. 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   Assessment & Plan    Unstable angina Multivessel CAD with hx of CABG x2 2019  -  presented with left sided chest pain for 2 weeks raditing to left arm  - known multivessel CAD with two-vessel CABG in 2019 and subsequently documented graft disease and inability to perform PCI of mid LAD CTO (distal to the LIMA anastomosis) at [facility] in February 2022 - HS trop 98 >120 >326>458> [id number] - ECG does show more prominent ST segment depression compared to tracing from January. - Echo 09/13/21 showed EF 50-55%, moderate concentric LVH, posterior wall and basal inferior segment are hypokinetic, indeterminate diastolic parameters, elevated LVEDP, moderate MR, moderate TR, mild aortic sclerosis - heparin gtt stopped due to possible GI bleeding with worsening anemia, continued on ASA 81mg , Lipitor 40mg , metoprolol 50mg  daily, and Imdur 15mg  for medical therapy  - discussed with MD, no cardiac cath for ischemic evaluation at this time until GI workup for worsening anemia   Acute on chronic anemia  - Hgb dropped from 11 ranges to 7.6 POA, now 7.3, required 1 unit PRBC with dialysis this admission - seen by GI, had one episode of melena, recommend PPI BID, consider EGD - Iron panel from 09/12/21 not suggestive iron deficiency, likely from ESRD - defer to nephrology for Epogen use   Persistent atrial fibrillation - CHA2DS2-VASc score 4.  Eliquis is presently on hold due to worsening anemia  - Heart rate controlled on Toprol-XL and amiodarone   Hx of sustained VT with syncope Biotronik ICD in place, implanted at [facility] in February 2022 -  His last LVEF was 40 to 45%, Echo repeated 09/13/21 with improved EF  50-55% -  requested device interrogation today   ESRD on hemodialysis - managed by nephrology, last HD 09/12/21 , due for dialysis today   Type 2 DM - managed per IM    For questions or updates, please contact [facility] Please consult [email or web address] for contact info under        Signed, Margie Billet, NP  09/14/2021, 9:30 AM

## 2021-09-14 NOTE — Progress Notes (Signed)
PROGRESS NOTE   Shaun Burns  LTJ:[id number] DOB: [birth year] DOA: 09/12/2021 PCP: Briant Sites, PA-C  Brief Narrative:   50 year old black male permanent A. 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-99% CTO mid LAD-(inability to perform PCI of mid LAD at [facility]), sustained VT + syncope + Biotronik ICD placement 01/2021-EF 40-45% at that time ESRD MWF DM TY 2 HTN Chronic tobacco Presented from dialysis center 9/14 ongoing chest pain about a week numbness over central anterior chest wall-on presentation to ED hemoglobin down from 11-7.6-patient refused to guaiac--no reported melena hematemesis-prior history of gastric ulcer 06/2017 [facility] follow-up EGD gastric erosions Initial troponin was 98 but EKG showed ST depression in lateral leads  Developed hypotension, troponin bumped to the 600 range concerning for unstable angina, had 1 dark stool Elevated troponins and, ST depressions prompted transfer to [facility] for consideration of cardiac cath-Heparin was stopped by cardiology   Hospital-Problem based course  NSTEMI-prior CAD CABG 2019 inability to perform PCI to mid LAD at [place] cath candidate-Heparin discontinued 2/2 risk of bleed-defer to cardiology Continuing Toprol-XL 50 daily hydralazine 25 every 8 atorvastatin 40 sublingual nitroglycerin--may have to eventually dose some menance on nondialysis days because of hypotension see below Imdur 60 on hold, aspirin home dose 81 twice daily currently held secondary to bleed He currently has no chest pain Probable upper GI bleed Small black stool yesterday-hemoglobin baseline 11 currently 7.3 Transfusion threshold below 7 Holding Eliquis at this time ?  EGD a.m. per GI, continue Protonix 40 twice daily ESRD MWF Chronic hypotension-continue midodrine 10 mg 3 times daily Anemia panel shows iron is okay saturation ratios are good Aranesp  as per renal Continue Renvela 800 3 times daily Permanent A. 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 Continue amiodarone 200 daily-Eliquis 5 twice daily on hold secondary to GI bleed HFrEF on last echo Volume management with dialysis Is anuric DM TY 2 Not on PTA meds--CBG ranging 92-1 15 eating 100% of meals continue sliding-scale coverage Outpatient reeval   DVT prophylaxis: SCD Code Status: Full Family Communication: None present Disposition:  Status is: Inpatient  Remains inpatient appropriate because:Ongoing active pain requiring inpatient pain management and Ongoing diagnostic testing needed not appropriate for outpatient work up  Dispo: The patient is from: Home              Anticipated d/c is to: Home              Patient currently is not medically stable to d/c.   Difficult to place patient No       Consultants:  Gastroenterology Renal Cardiology  Procedures: None yet  Antimicrobials: No   Subjective: Passing stool--had a dark formed one earlier today when I wet ot visit him Now on HD Looks well, no n/v/abd pain fever Seems comfortable without any CP  Objective: Vitals:   09/13/21 1800 09/13/21 1909 09/14/21 0011 09/14/21 0505  BP: 113/78 (!) 112/92 105/67 (!) 104/56  Pulse: 81 88 77 73  Resp: 17 20 19 20   Temp:  98.7 F (37.1 C) 98.5 F (36.9 C) 98.5 F (36.9 C)  TempSrc:  Oral Oral Oral  SpO2: 99% 99% 100% 99%  Weight:  95 kg    Height:  5\' 10"  (1.778 m)     No intake or output data in the 24 hours ending 09/14/21 0728 Filed Weights   09/12/21 1750 09/12/21 2311 09/13/21 1909  Weight: 95.2  kg 93.7 kg 95 kg    Examination:  Eomi ncat no focal deficit Mild pallor no ict, thorat soft supple Cannot appreciate thyroid Ctab no rales S1 s2 nom, seems to be in afib on monitor Abd soft, no HSM no rebound gaurd Fistula in LUE, seems to be working  No le edema  Data Reviewed: personally reviewed   CBC    Component Value Date/Time    WBC 7.6 09/14/2021 0140   RBC 2.48 (L) 09/14/2021 0140   HGB 7.3 (L) 09/14/2021 0140   HCT 22.9 (L) 09/14/2021 0140   PLT 161 09/14/2021 0140   MCV 92.3 09/14/2021 0140   MCH 29.4 09/14/2021 0140   MCHC 31.9 09/14/2021 0140   RDW 18.8 (H) 09/14/2021 0140   LYMPHSABS 1.5 01/01/2021 0914   MONOABS 0.6 01/01/2021 0914   EOSABS 0.1 01/01/2021 0914   BASOSABS 0.0 01/01/2021 0914   CMP Latest Ref Rng & Units 09/14/2021 09/13/2021 09/12/2021  Glucose 70 - 99 mg/dL 103(H) 90 119(H)  BUN 6 - 20 mg/dL 47(H) 34(H) 70(H)  Creatinine 0.61 - 1.24 mg/dL 8.88(H) 6.90(H) 11.76(H)  Sodium 135 - 145 mmol/L 133(L) 134(L) 137  Potassium 3.5 - 5.1 mmol/L 4.5 3.8 5.0  Chloride 98 - 111 mmol/L 93(L) 95(L) 95(L)  CO2 22 - 32 mmol/L 29 30 27   Calcium 8.9 - 10.3 mg/dL 9.3 8.2(L) 9.3  Total Protein 6.5 - 8.1 g/dL - 6.0(L) -  Total Bilirubin 0.3 - 1.2 mg/dL - 0.6 -  Alkaline Phos 38 - 126 U/L - 36(L) -  AST 15 - 41 U/L - 14(L) -  ALT 0 - 44 U/L - 18 -     Radiology Studies: DG Chest 2 View  Result Date: 09/12/2021 CLINICAL DATA:  Chest pain. EXAM: CHEST - 2 VIEW COMPARISON:  02/10/2021 FINDINGS: Two views of the chest demonstrate a right chest single lead ICD. Heart size is within normal limits. Both lungs are clear without pulmonary edema or focal airspace disease. No pleural effusions. Prior median sternotomy. IMPRESSION: 1. No acute cardiopulmonary disease. Electronically Signed   By: Markus Daft M.D.   On: 09/12/2021 09:56   ECHOCARDIOGRAM COMPLETE  Result Date: 09/13/2021    ECHOCARDIOGRAM REPORT   Patient Name:   Shaun Burns Date of Exam: 09/13/2021 Medical Rec #:  [id number]     Height:       70.0 in Accession #:    [id number]    Weight:       206.6 lb Date of Birth:  [birth year]      BSA:          2.116 m Patient Age:    70 years      BP:           101/60 mmHg Patient Gender: M             HR:           71 bpm. Exam Location:  [facility] Procedure: 2D Echo, Cardiac Doppler and Color Doppler Indications:     Chest Pain  History:        Patient has prior history of Echocardiogram examinations, most                 recent 10/17/2020. CAD and Previous Myocardial Infarction,                 Defibrillator, Abnormal ECG and Prior CABG, Arrythmias:Atrial  Fibrillation, Signs/Symptoms:Chest Pain; Risk Factors:Tobacco                 abuse, Hypertension and Diabetes.  Sonographer:    Wenda Low Referring Phys: [place]  1. Left ventricular ejection fraction, by estimation, is 50 to 55%. The left ventricle has low normal function. The left ventricle demonstrates regional wall motion abnormalities (see scoring diagram/findings for description). There is moderate concentric left ventricular hypertrophy. Left ventricular diastolic parameters are indeterminate. Elevated left ventricular end-diastolic pressure.  2. Right ventricular systolic function is normal. The right ventricular size is normal. There is mildly elevated pulmonary artery systolic pressure. The estimated right ventricular systolic pressure is 35.5 mmHg.  3. Left atrial size was severely dilated.  4. The mitral valve is abnormal. Moderate mitral valve regurgitation. Moderate mitral annular calcification.  5. Tricuspid valve regurgitation is moderate.  6. The aortic valve is tricuspid, calcified with restricted motion of the left coronary cusp. Aortic valve regurgitation is not visualized. Mild aortic valve sclerosis is present, with no evidence of aortic valve stenosis. Aortic valve mean gradient measures 6.7 mmHg.  7. The inferior vena cava is dilated in size with >50% respiratory variability, suggesting right atrial pressure of 8 mmHg. Comparison(s): Prior images reviewed side by side. LVEF similar range. FINDINGS  Left Ventricle: Left ventricular ejection fraction, by estimation, is 50 to 55%. The left ventricle has low normal function. The left ventricle demonstrates regional wall motion abnormalities. The left  ventricular internal cavity size was normal in size. There is moderate concentric left ventricular hypertrophy. Left ventricular diastolic parameters are indeterminate. Elevated left ventricular end-diastolic pressure.  LV Wall Scoring: The posterior wall and basal inferior segment are hypokinetic. The entire anterior wall, antero-lateral wall, entire septum, entire apex, and mid and distal inferior wall are normal. Right Ventricle: The right ventricular size is normal. No increase in right ventricular wall thickness. Right ventricular systolic function is normal. There is mildly elevated pulmonary artery systolic pressure. The tricuspid regurgitant velocity is 2.79  m/s, and with an assumed right atrial pressure of 8 mmHg, the estimated right ventricular systolic pressure is 73.2 mmHg. Left Atrium: Left atrial size was severely dilated. Right Atrium: Right atrial size was normal in size. Pericardium: There is no evidence of pericardial effusion. Mitral Valve: The mitral valve is abnormal. Moderate mitral annular calcification. Moderate mitral valve regurgitation, with posteriorly-directed jet. MV peak gradient, 16.5 mmHg. The mean mitral valve gradient is 6.0 mmHg. Tricuspid Valve: The tricuspid valve is grossly normal. Tricuspid valve regurgitation is moderate. Aortic Valve: The aortic valve is tricuspid. Aortic valve regurgitation is not visualized. Mild aortic valve sclerosis is present, with no evidence of aortic valve stenosis. Aortic valve mean gradient measures 6.7 mmHg. Aortic valve peak gradient measures 12.1 mmHg. Aortic valve area, by VTI measures 1.76 cm. Pulmonic Valve: The pulmonic valve was grossly normal. Pulmonic valve regurgitation is trivial. Aorta: The aortic root is normal in size and structure. Venous: The inferior vena cava is dilated in size with greater than 50% respiratory variability, suggesting right atrial pressure of 8 mmHg. IAS/Shunts: No atrial level shunt detected by color flow  Doppler. Additional Comments: A device lead is visualized.  LEFT VENTRICLE PLAX 2D LVIDd:         4.90 cm      Diastology LVIDs:         3.30 cm      LV e' lateral:   11.00 cm/s LV PW:  1.60 cm      LV E/e' lateral: 18.0 LV IVS:        1.50 cm LVOT diam:     2.10 cm LV SV:         57 LV SV Index:   27 LVOT Area:     3.46 cm  LV Volumes (MOD) LV vol d, MOD A2C: 116.0 ml LV vol d, MOD A4C: 130.0 ml LV vol s, MOD A2C: 67.7 ml LV vol s, MOD A4C: 66.3 ml LV SV MOD A2C:     48.3 ml LV SV MOD A4C:     130.0 ml LV SV MOD BP:      58.3 ml RIGHT VENTRICLE RV Basal diam:  4.30 cm RV Mid diam:    3.30 cm RV S prime:     9.00 cm/s TAPSE (M-mode): 1.8 cm LEFT ATRIUM              Index       RIGHT ATRIUM           Index LA diam:        5.00 cm  2.36 cm/m  RA Area:     24.00 cm LA Vol (A2C):   154.0 ml 72.77 ml/m RA Volume:   71.40 ml  33.74 ml/m LA Vol (A4C):   120.0 ml 56.71 ml/m LA Biplane Vol: 142.0 ml 67.10 ml/m  AORTIC VALVE AV Area (Vmax):    1.64 cm AV Area (Vmean):   1.60 cm AV Area (VTI):     1.76 cm AV Vmax:           173.67 cm/s AV Vmean:          120.333 cm/s AV VTI:            0.324 m AV Peak Grad:      12.1 mmHg AV Mean Grad:      6.7 mmHg LVOT Vmax:         82.20 cm/s LVOT Vmean:        55.600 cm/s LVOT VTI:          0.165 m LVOT/AV VTI ratio: 0.51  AORTA Ao Root diam: 3.20 cm MITRAL VALVE                TRICUSPID VALVE MV Area (PHT): 2.84 cm     TR Peak grad:   31.1 mmHg MV Area VTI:   1.32 cm     TR Vmax:        279.00 cm/s MV Peak grad:  16.5 mmHg MV Mean grad:  6.0 mmHg     SHUNTS MV Vmax:       2.03 m/s     Systemic VTI:  0.16 m MV Vmean:      111.0 cm/s   Systemic Diam: 2.10 cm MV Decel Time: 267 msec MV E velocity: 198.00 cm/s Rozann Lesches MD Electronically signed by Rozann Lesches MD Signature Date/Time: 09/13/2021/12:31:34 PM    Final      Scheduled Meds:  amiodarone  200 mg Oral Daily   aspirin EC  81 mg Oral BID   atorvastatin  40 mg Oral QPM   Chlorhexidine Gluconate Cloth  6  each Topical Q0600   Chlorhexidine Gluconate Cloth  6 each Topical Q0600   darbepoetin (ARANESP) injection - DIALYSIS  60 mcg Intravenous Q Fri-HD   influenza vac split quadrivalent PF  0.5 mL Intramuscular Tomorrow-1000   insulin aspart  0-6 Units Subcutaneous TID WC   isosorbide  mononitrate  15 mg Oral QHS   metoprolol succinate  50 mg Oral Daily   midodrine  10 mg Oral TID WC   mupirocin ointment  1 application Nasal BID   pantoprazole  40 mg Oral BID AC   sevelamer carbonate  800 mg Oral TID WC   Continuous Infusions:  sodium chloride     sodium chloride       LOS: 2 days   Time spent: Castine, MD [facility] To contact the attending provider between 7A-7P or the covering provider during after hours 7P-7A, please log into the web site [email or web address] and access using universal [place] password for that web site. If you do not have the password, please call the hospital operator.  09/14/2021, 7:28 AM

## 2021-09-14 NOTE — Progress Notes (Signed)
[facility] NEPHROLOGY PROGRESS NOTE  Assessment/ Plan: Pt is a 50 y.o. yo male  with history of hypertension, HLD, DM, CAD status post CABG, ESRD on HD MWF at [facility] presented with chest pain, seen as a consultation for the management of ESRD.   OP HD: [name], MWF, LUE AVG.  #Chest pain/unstable angina: Troponin mildly elevated.  Plan to discontinue IV heparin because of GI bleed.  Cardiology is following, pending echo.  Started on mdur, metoprolol.  Not sure if patient can actually tolerate this medication because of hypotension. -medical mgmt   # ESRD: MWF at [place].  Status post HD on 9/14 for 4 hours, unable to UF because of hypotension despite of lowering dialysate temperature and use of albumin.  Started on midodrine 10 mg 3 times daily.  Plan for next HD today  #Hypotension/volume: On cardiac medication for angina.  Midodrine started as above.  UF as tolerated.  Patient reports chronic hypotension as outpatient as well.   # Anemia of ESRD, GIB: Iron saturation 40%.  Received a unit of PRBC on 9/14.  Start ESA with HD.  Monitor hemoglobin and transfuse as needed. GI on board   # Metabolic Bone Disease: Calcium and phosphorus level at goal.   Subjective: transferred from [facility]. No acute events, no complaints. Denies CP Objective Vital signs in last 24 hours: Vitals:   09/14/21 0505 09/14/21 0749 09/14/21 0810 09/14/21 1143  BP: (!) 104/56 117/83 122/89 124/77  Pulse: 73 78  76  Resp: 20 18  18   Temp: 98.5 F (36.9 C) 98.5 F (36.9 C)  98.6 F (37 C)  TempSrc: Oral Oral  Oral  SpO2: 99%     Weight:      Height:       Weight change: 1.996 kg  Intake/Output Summary (Last 24 hours) at 09/14/2021 1209 Last data filed at 09/14/2021 0900 Gross per 24 hour  Intake 120 ml  Output --  Net 120 ml       Labs: Basic Metabolic Panel: Recent Labs  Lab 09/12/21 0903 09/13/21 0433 09/14/21 0140  NA 137 134* 133*  K 5.0 3.8 4.5  CL 95* 95* 93*  CO2  27 30 29   GLUCOSE 119* 90 103*  BUN 70* 34* 47*  CREATININE 11.76* 6.90* 8.88*  CALCIUM 9.3 8.2* 9.3  PHOS  --  4.7* 6.1*   Liver Function Tests: Recent Labs  Lab 09/13/21 0433 09/14/21 0140  AST 14*  --   ALT 18  --   ALKPHOS 36*  --   BILITOT 0.6  --   PROT 6.0*  --   ALBUMIN 3.5  3.4* 3.3*   No results for input(s): LIPASE, AMYLASE in the last 168 hours. No results for input(s): AMMONIA in the last 168 hours. CBC: Recent Labs  Lab 09/12/21 0903 09/13/21 0433 09/14/21 0140 09/14/21 1112  WBC 8.1 4.8 7.6 7.0  HGB 7.6* 7.1* 7.3* 7.4*  HCT 24.6* 22.0* 22.9* 23.0*  MCV 98.4 94.0 92.3 93.5  PLT 188 136* 161 167   Cardiac Enzymes: No results for input(s): CKTOTAL, CKMB, CKMBINDEX, TROPONINI in the last 168 hours. CBG: Recent Labs  Lab 09/13/21 1112 09/13/21 1642 09/13/21 2120 09/14/21 0748 09/14/21 1142  GLUCAP 79 117* 104* 92 115*    Iron Studies:  Recent Labs    09/12/21 1006  IRON 99  TIBC 246*  FERRITIN 640*   Studies/Results: ECHOCARDIOGRAM COMPLETE  Result Date: 09/13/2021    ECHOCARDIOGRAM REPORT  Patient Name:   Jontae Rehman Date of Exam: 09/13/2021 Medical Rec #:  [id number]     Height:       70.0 in Accession #:    [id number]    Weight:       206.6 lb Date of Birth:  1971/12/29      BSA:          2.116 m Patient Age:    59 years      BP:           101/60 mmHg Patient Gender: M             HR:           71 bpm. Exam Location:  [facility] Procedure: 2D Echo, Cardiac Doppler and Color Doppler Indications:    Chest Pain  History:        Patient has prior history of Echocardiogram examinations, most                 recent 10/17/2020. CAD and Previous Myocardial Infarction,                 Defibrillator, Abnormal ECG and Prior CABG, Arrythmias:Atrial                 Fibrillation, Signs/Symptoms:Chest Pain; Risk Factors:Tobacco                 abuse, Hypertension and Diabetes.  Sonographer:    Wenda Low Referring Phys: [place]  1. Left ventricular ejection fraction, by estimation, is 50 to 55%. The left ventricle has low normal function. The left ventricle demonstrates regional wall motion abnormalities (see scoring diagram/findings for description). There is moderate concentric left ventricular hypertrophy. Left ventricular diastolic parameters are indeterminate. Elevated left ventricular end-diastolic pressure.  2. Right ventricular systolic function is normal. The right ventricular size is normal. There is mildly elevated pulmonary artery systolic pressure. The estimated right ventricular systolic pressure is 78.9 mmHg.  3. Left atrial size was severely dilated.  4. The mitral valve is abnormal. Moderate mitral valve regurgitation. Moderate mitral annular calcification.  5. Tricuspid valve regurgitation is moderate.  6. The aortic valve is tricuspid, calcified with restricted motion of the left coronary cusp. Aortic valve regurgitation is not visualized. Mild aortic valve sclerosis is present, with no evidence of aortic valve stenosis. Aortic valve mean gradient measures 6.7 mmHg.  7. The inferior vena cava is dilated in size with >50% respiratory variability, suggesting right atrial pressure of 8 mmHg. Comparison(s): Prior images reviewed side by side. LVEF similar range. FINDINGS  Left Ventricle: Left ventricular ejection fraction, by estimation, is 50 to 55%. The left ventricle has low normal function. The left ventricle demonstrates regional wall motion abnormalities. The left ventricular internal cavity size was normal in size. There is moderate concentric left ventricular hypertrophy. Left ventricular diastolic parameters are indeterminate. Elevated left ventricular end-diastolic pressure.  LV Wall Scoring: The posterior wall and basal inferior segment are hypokinetic. The entire anterior wall, antero-lateral wall, entire septum, entire apex, and mid and distal inferior wall are normal. Right Ventricle: The right  ventricular size is normal. No increase in right ventricular wall thickness. Right ventricular systolic function is normal. There is mildly elevated pulmonary artery systolic pressure. The tricuspid regurgitant velocity is 2.79  m/s, and with an assumed right atrial pressure of 8 mmHg, the estimated right ventricular systolic pressure is 38.1 mmHg. Left Atrium: Left atrial size was severely dilated. Right Atrium: Right atrial  size was normal in size. Pericardium: There is no evidence of pericardial effusion. Mitral Valve: The mitral valve is abnormal. Moderate mitral annular calcification. Moderate mitral valve regurgitation, with posteriorly-directed jet. MV peak gradient, 16.5 mmHg. The mean mitral valve gradient is 6.0 mmHg. Tricuspid Valve: The tricuspid valve is grossly normal. Tricuspid valve regurgitation is moderate. Aortic Valve: The aortic valve is tricuspid. Aortic valve regurgitation is not visualized. Mild aortic valve sclerosis is present, with no evidence of aortic valve stenosis. Aortic valve mean gradient measures 6.7 mmHg. Aortic valve peak gradient measures 12.1 mmHg. Aortic valve area, by VTI measures 1.76 cm. Pulmonic Valve: The pulmonic valve was grossly normal. Pulmonic valve regurgitation is trivial. Aorta: The aortic root is normal in size and structure. Venous: The inferior vena cava is dilated in size with greater than 50% respiratory variability, suggesting right atrial pressure of 8 mmHg. IAS/Shunts: No atrial level shunt detected by color flow Doppler. Additional Comments: A device lead is visualized.  LEFT VENTRICLE PLAX 2D LVIDd:         4.90 cm      Diastology LVIDs:         3.30 cm      LV e' lateral:   11.00 cm/s LV PW:         1.60 cm      LV E/e' lateral: 18.0 LV IVS:        1.50 cm LVOT diam:     2.10 cm LV SV:         57 LV SV Index:   27 LVOT Area:     3.46 cm  LV Volumes (MOD) LV vol d, MOD A2C: 116.0 ml LV vol d, MOD A4C: 130.0 ml LV vol s, MOD A2C: 67.7 ml LV vol s, MOD  A4C: 66.3 ml LV SV MOD A2C:     48.3 ml LV SV MOD A4C:     130.0 ml LV SV MOD BP:      58.3 ml RIGHT VENTRICLE RV Basal diam:  4.30 cm RV Mid diam:    3.30 cm RV S prime:     9.00 cm/s TAPSE (M-mode): 1.8 cm LEFT ATRIUM              Index       RIGHT ATRIUM           Index LA diam:        5.00 cm  2.36 cm/m  RA Area:     24.00 cm LA Vol (A2C):   154.0 ml 72.77 ml/m RA Volume:   71.40 ml  33.74 ml/m LA Vol (A4C):   120.0 ml 56.71 ml/m LA Biplane Vol: 142.0 ml 67.10 ml/m  AORTIC VALVE AV Area (Vmax):    1.64 cm AV Area (Vmean):   1.60 cm AV Area (VTI):     1.76 cm AV Vmax:           173.67 cm/s AV Vmean:          120.333 cm/s AV VTI:            0.324 m AV Peak Grad:      12.1 mmHg AV Mean Grad:      6.7 mmHg LVOT Vmax:         82.20 cm/s LVOT Vmean:        55.600 cm/s LVOT VTI:          0.165 m LVOT/AV VTI ratio: 0.51  AORTA Ao Root diam: 3.20 cm MITRAL VALVE  TRICUSPID VALVE MV Area (PHT): 2.84 cm     TR Peak grad:   31.1 mmHg MV Area VTI:   1.32 cm     TR Vmax:        279.00 cm/s MV Peak grad:  16.5 mmHg MV Mean grad:  6.0 mmHg     SHUNTS MV Vmax:       2.03 m/s     Systemic VTI:  0.16 m MV Vmean:      111.0 cm/s   Systemic Diam: 2.10 cm MV Decel Time: 267 msec MV E velocity: 198.00 cm/s Rozann Lesches MD Electronically signed by Rozann Lesches MD Signature Date/Time: 09/13/2021/12:31:34 PM    Final     Medications: Infusions:  sodium chloride     sodium chloride      Scheduled Medications:  amiodarone  200 mg Oral Daily   atorvastatin  40 mg Oral QPM   Chlorhexidine Gluconate Cloth  6 each Topical Q0600   Chlorhexidine Gluconate Cloth  6 each Topical Q0600   darbepoetin (ARANESP) injection - DIALYSIS  60 mcg Intravenous Q Fri-HD   hydrALAZINE  25 mg Oral Q8H   influenza vac split quadrivalent PF  0.5 mL Intramuscular Tomorrow-1000   insulin aspart  0-6 Units Subcutaneous TID WC   isosorbide mononitrate  15 mg Oral QHS   metoprolol succinate  50 mg Oral Daily   midodrine   10 mg Oral TID WC   mupirocin ointment  1 application Nasal BID   pantoprazole  40 mg Oral BID AC   sevelamer carbonate  800 mg Oral TID WC    have reviewed scheduled and prn medications.  Physical Exam: General:NAD, comfortable Heart:RRR, s1s2 nl Lungs:clear b/l, no crackle Abdomen:soft, Non-tender, non-distended Extremities:No edema Dialysis Access: Left AVG has good thrill and bruit.  Kaelynn Igo 09/14/2021,12:09 PM  LOS: 2 days

## 2021-09-14 NOTE — H&P (View-Only) (Signed)
Consultation  Referring Provider: Dr. Verlon Au    Primary Care Physician:  Briant Sites, PA-C Primary Gastroenterologist: [facility]       Reason for Consultation: Anemia            HPI:   Shaun Burns is a 50 y.o. male with a past medical history significant for CAD (echo EF 50-55%), status post CABG in 2014, ICD placement, graft occlusion, end-stage renal disease on hemodialysis, type 2 diabetes, chronic A. fib on apixaban and multiple others, who presented to the hospital with chest pain and initial EKG showed ST depression in the lateral leads as well as rate controlled A. fib.  Cardiology recommended he be transferred to [facility] but there were no beds available so he was admitted to [facility] for observation and troponin testing.  We are called in consult in regards to anemia.    At time of presentation patient complained of chest pain which had been going on for the past week and worsening over the past 24 hours, he had been sent by his dialysis center and he was noted to be a poor historian and described some numbness over the central anterior chest wall.    Today, the patient tells me that he started having chest pain while at dialysis on 9/14.  Explains that he was sent over to the ER and admitted to the hospital.  There he had a dark black stool but he tells me he has been having these at least once every 3 to 4 days for "as long as I can remember", and the "nurses got excited", and tested it and "it was positive for blood".  Explains that he has history of a bleeding ulcer in 2018 which was apparently treated at [facility] but "I have not had any problems with it since then".  Is aware that cardiology thinks his chest pain is related to anemia and that we are unsure why he has anemia.  Does tell me he required blood transfusion at that time as well.    When discussing colonoscopy patient is very hesitant and in fact does not want this procedure done.    Denies NSAID  use, fever, chills, weight loss, change in bowel habits, abdominal pain, heartburn or reflux.  ER course: Hemoglobin 7.6 (from 11), apparently reviewed rectal exam for guaiac testing  GI history: 09/12/2021 consulted by GI at [facility]: GI was called in regards to concern for an upper GI bleed and history of bleeding ulcers 11/26/2017 EGD at [facility] for follow-up after gastric ulcers (op note not available BiPAP with gastric erosions, no H. pylori) 07/22/2017 EGD with 2 gastric ulcers, 1 linear ulcer at [facility], pathology with chronic gastritis and 5.0 purulent exudate consistent with ulcer formation, H. pylori  Past Medical History:  Diagnosis Date   CAD (coronary artery disease)    a. s/p CABG in 2019 with LIMA-LAD-D1 and SVG-PDA b. cath 01/2021 [facility] showing CTO mLAD unable to be crossed with wire, diffusely diseased small circ, LIMA-D2-LAD patent, SVG-RCA occluded, and CTO RCA with L-->R collaterals and medical management was recommended   ESRD on hemodialysis [facility])    Essential hypertension    GERD (gastroesophageal reflux disease)    Headache    History of GI bleed    Ischemic cardiomyopathy    Biotronik single chamber ICD (VDD lead) - February 2022 [facility]   Sustained VT (ventricular tachycardia) [facility])    February 2022   Type 2  diabetes mellitus [facility])     Past Surgical History:  Procedure Laterality Date   A/V FISTULAGRAM N/A 08/13/2018   Procedure: A/V FISTULAGRAM;  Surgeon: Marty Heck, MD;  Location: [place] [facility];  Service: Cardiovascular;  Laterality: N/A;   A/V FISTULAGRAM N/A 01/25/2020   Procedure: A/V FISTULAGRAM - Left Arm;  Surgeon: Serafina Mitchell, MD;  Location: [place] [facility];  Service: Cardiovascular;  Laterality: N/A;   AV FISTULA PLACEMENT Left 03/30/2015   Procedure: LEFT BRACHIOCEPHALIC ARTERIOVENOUS (AV) FISTULA CREATION;  Surgeon: Elam Dutch, MD;  Location: [place];  Service: Vascular;  Laterality: Left;   CORONARY ARTERY BYPASS GRAFT  2019    KNEE SURGERY     PERIPHERAL VASCULAR BALLOON ANGIOPLASTY Left 08/13/2018   Procedure: PERIPHERAL VASCULAR BALLOON ANGIOPLASTY;  Surgeon: Marty Heck, MD;  Location: [place] [facility];  Service: Cardiovascular;  Laterality: Left;  central venous    PERIPHERAL VASCULAR BALLOON ANGIOPLASTY Left 01/25/2020   Procedure: PERIPHERAL VASCULAR BALLOON ANGIOPLASTY;  Surgeon: Serafina Mitchell, MD;  Location: [place] [facility];  Service: Cardiovascular;  Laterality: Left;  arm fistula   PERIPHERAL VASCULAR CATHETERIZATION Left 01/11/2016   Procedure: A/V Shuntogram/Fistulagram;  Surgeon: Conrad West Kittanning, MD;  Location: [place] [facility];  Service: Cardiovascular;  Laterality: Left;    Family History  Problem Relation Age of Onset   Hypertension Father    Stroke Father    Diabetes Mother     Social History   Tobacco Use   Smoking status: Every Day    Packs/day: 0.50    Types: Cigarettes   Smokeless tobacco: Never  Substance Use Topics   Alcohol use: No    Alcohol/week: 0.0 standard drinks   Drug use: No    Prior to Admission medications   Medication Sig Start Date End Date Taking? Authorizing Provider  acetaminophen (TYLENOL) 500 MG tablet Take 1,500 mg by mouth 2 (two) times daily as needed for moderate pain.    Yes [provider]  albuterol (VENTOLIN HFA) 108 (90 Base) MCG/ACT inhaler Inhale 2 puffs into the lungs as needed for wheezing or shortness of breath.   Yes [provider]  amiodarone (PACERONE) 200 MG tablet Take 200 mg by mouth daily.   Yes [provider]  apixaban (ELIQUIS) 5 MG TABS tablet Take 1 tablet (5 mg total) by mouth 2 (two) times daily. Patient taking differently: Take 2.5 mg by mouth 2 (two) times daily. 10/19/20  Yes TatShanon Brow, MD  aspirin EC 81 MG tablet Take 81 mg by mouth 2 (two) times daily.  07/08/18  Yes [provider]  atorvastatin (LIPITOR) 40 MG tablet Take 40 mg by mouth every evening.   Yes [provider]  metoprolol succinate (TOPROL-XL) 50 MG 24 hr tablet Take 50 mg by mouth daily. 07/22/21  Yes [provider]  sevelamer carbonate (RENVELA) 800 MG tablet Take 800 mg by mouth in the morning and at bedtime. 10/12/20  Yes [provider]  carvedilol (COREG) 12.5 MG tablet Take 12.5 mg by mouth daily at 6 (six) AM. Patient not taking: Reported on 09/12/2021 10/12/20   [provider]  diltiazem (CARDIZEM CD) 180 MG 24 hr capsule Take 1 capsule (180 mg total) by mouth daily. Patient not taking: No sig reported 10/19/20   Orson Eva, MD  isosorbide mononitrate (IMDUR) 60 MG 24 hr tablet Take 60 mg by mouth daily. Patient not taking: Reported on 09/12/2021 08/24/20  [provider]    Current Facility-Administered Medications  Medication Dose Route Frequency Provider Last Rate Last Admin   0.9 %  sodium chloride infusion  100 mL Intravenous PRN Rosita Fire, MD       0.9 %  sodium chloride infusion  100 mL Intravenous PRN Rosita Fire, MD       acetaminophen (TYLENOL) tablet 650 mg  650 mg Oral Q6H PRN Johnson, Clanford L, MD       Or   acetaminophen (TYLENOL) suppository 650 mg  650 mg Rectal Q6H PRN Johnson, Clanford L, MD       albuterol (VENTOLIN HFA) 108 (90 Base) MCG/ACT inhaler 2 puff  2 puff Inhalation PRN Johnson, Clanford L, MD       amiodarone (PACERONE) tablet 200 mg  200 mg Oral Daily Johnson, Clanford L, MD   200 mg at 09/13/21 0920   atorvastatin (LIPITOR) tablet 40 mg  40 mg Oral QPM Johnson, Clanford L, MD   40 mg at 09/13/21 1647   bisacodyl (DULCOLAX) EC tablet 5 mg  5 mg Oral Daily PRN Wynetta Emery, Clanford L, MD       Chlorhexidine Gluconate Cloth 2 % PADS 6 each  6 each Topical Q0600 Rosita Fire, MD   6 each at 09/13/21 1940   Chlorhexidine Gluconate Cloth 2 % PADS 6 each  6 each Topical Q0600 Rosita Fire, MD   6 each at 09/13/21 1001   Darbepoetin Alfa (ARANESP) injection 60 mcg  60 mcg  Intravenous Q Fri-HD Rosita Fire, MD       fentaNYL (SUBLIMAZE) injection 25 mcg  25 mcg Intravenous Q2H PRN Johnson, Clanford L, MD       hydrALAZINE (APRESOLINE) tablet 25 mg  25 mg Oral Q8H O'Neal, Cassie Freer, MD       influenza vac split quadrivalent PF (FLUARIX) injection 0.5 mL  0.5 mL Intramuscular Tomorrow-1000 Johnson, Clanford L, MD       insulin aspart (novoLOG) injection 0-6 Units  0-6 Units Subcutaneous TID WC Johnson, Clanford L, MD       isosorbide mononitrate (IMDUR) 24 hr tablet 15 mg  15 mg Oral QHS Satira Sark, MD   15 mg at 09/13/21 2121   lidocaine (PF) (XYLOCAINE) 1 % injection 5 mL  5 mL Intradermal PRN Rosita Fire, MD       lidocaine-prilocaine (EMLA) cream 1 application  1 application Topical PRN Rosita Fire, MD       metoprolol succinate (TOPROL-XL) 24 hr tablet 50 mg  50 mg Oral Daily Johnson, Clanford L, MD   50 mg at 09/13/21 0918   midodrine (PROAMATINE) tablet 10 mg  10 mg Oral TID WC Rosita Fire, MD   10 mg at 09/14/21 1214   mupirocin ointment (BACTROBAN) 2 % 1 application  1 application Nasal BID Wynetta Emery, Clanford L, MD   1 application at 23/55/73 0905   nicotine (NICODERM CQ - dosed in mg/24 hours) patch 14 mg  14 mg Transdermal Daily PRN Johnson, Clanford L, MD       nitroGLYCERIN (NITROSTAT) SL tablet 0.4 mg  0.4 mg Sublingual Q5 min PRN Johnson, Clanford L, MD       ondansetron (ZOFRAN) tablet 4 mg  4 mg Oral Q6H PRN Johnson, Clanford L, MD       Or   ondansetron (ZOFRAN) injection 4 mg  4 mg Intravenous Q6H PRN Murlean Iba, MD  pantoprazole (PROTONIX) EC tablet 40 mg  40 mg Oral BID AC Mahala Menghini, PA-C   40 mg at 09/13/21 1647   pentafluoroprop-tetrafluoroeth (GEBAUERS) aerosol 1 application  1 application Topical PRN Rosita Fire, MD       sevelamer carbonate (RENVELA) tablet 800 mg  800 mg Oral TID WC Johnson, Clanford L, MD   800 mg at 09/14/21 1214    Allergies as of 09/12/2021    (No Known Allergies)     Review of Systems:    Constitutional: No weight loss, fever or chills Skin: No rash  Cardiovascular: No palpitations Respiratory: No SOB Gastrointestinal: See HPI and otherwise negative Genitourinary: No dysuria  Neurological: No headache, dizziness or syncope Musculoskeletal: No new muscle or joint pain Hematologic: No bruising Psychiatric: No history of depression or anxiety     Physical Exam:  Vital signs in last 24 hours: Temp:  [98.1 F (36.7 C)-98.7 F (37.1 C)] 98.6 F (37 C) (09/16 1143) Pulse Rate:  [65-88] 76 (09/16 1143) Resp:  [15-22] 18 (09/16 1143) BP: (104-131)/(56-92) 124/77 (09/16 1143) SpO2:  [99 %-100 %] 99 % (09/16 0505) Weight:  [95 kg] 95 kg (09/15 1909) Last BM Date: 09/13/21 General:   AA male appears to be in NAD, Well developed, Well nourished, alert and cooperative Head:  Normocephalic and atraumatic. Eyes:   PEERL, EOMI. No icterus. Conjunctiva pink. Ears:  Normal auditory acuity. Neck:  Supple Throat: Oral cavity and pharynx without inflammation, swelling or lesion. Teeth in good condition. Lungs: Respirations even and unlabored. Lungs clear to auscultation bilaterally.   No wheezes, crackles, or rhonchi.  Heart: Normal S1, S2.  Systolic murmur.  Irregularly irregular no peripheral edema, cyanosis or pallor.  Abdomen:  Soft, nondistended, nontender. No rebound or guarding. Normal bowel sounds. No appreciable masses or hepatomegaly. Rectal: Declined Msk:  Symmetrical without gross deformities. Peripheral pulses intact.  Extremities:  Without edema, no deformity or joint abnormality. Normal ROM, normal sensation. Neurologic:  Alert and  oriented x4;  grossly normal neurologically.  Skin:   Dry and intact without significant lesions or rashes. Psychiatric: Demonstrates good judgement and reason without abnormal affect or behaviors.   LAB RESULTS: Recent Labs    09/13/21 0433 09/14/21 0140 09/14/21 1112  WBC 4.8  7.6 7.0  HGB 7.1* 7.3* 7.4*  HCT 22.0* 22.9* 23.0*  PLT 136* 161 167   BMET Recent Labs    09/12/21 0903 09/13/21 0433 09/14/21 0140  NA 137 134* 133*  K 5.0 3.8 4.5  CL 95* 95* 93*  CO2 27 30 29   GLUCOSE 119* 90 103*  BUN 70* 34* 47*  CREATININE 11.76* 6.90* 8.88*  CALCIUM 9.3 8.2* 9.3   LFT Recent Labs    09/13/21 0433 09/14/21 0140  PROT 6.0*  --   ALBUMIN 3.5  3.4* 3.3*  AST 14*  --   ALT 18  --   ALKPHOS 36*  --   BILITOT 0.6  --   BILIDIR 0.1  --   IBILI 0.5  --     STUDIES: ECHOCARDIOGRAM COMPLETE  Result Date: 09/13/2021    ECHOCARDIOGRAM REPORT   Patient Name:   Shaun Burns Date of Exam: 09/13/2021 Medical Rec #:  [id number]     Height:       70.0 in Accession #:    [id number]    Weight:       206.6 lb Date of Birth:  01-09-1971      BSA:  2.116 m Patient Age:    77 years      BP:           101/60 mmHg Patient Gender: M             HR:           71 bpm. Exam Location:  [facility] Procedure: 2D Echo, Cardiac Doppler and Color Doppler Indications:    Chest Pain  History:        Patient has prior history of Echocardiogram examinations, most                 recent 10/17/2020. CAD and Previous Myocardial Infarction,                 Defibrillator, Abnormal ECG and Prior CABG, Arrythmias:Atrial                 Fibrillation, Signs/Symptoms:Chest Pain; Risk Factors:Tobacco                 abuse, Hypertension and Diabetes.  Sonographer:    Wenda Low Referring Phys: [place]  1. Left ventricular ejection fraction, by estimation, is 50 to 55%. The left ventricle has low normal function. The left ventricle demonstrates regional wall motion abnormalities (see scoring diagram/findings for description). There is moderate concentric left ventricular hypertrophy. Left ventricular diastolic parameters are indeterminate. Elevated left ventricular end-diastolic pressure.  2. Right ventricular systolic function is normal. The right ventricular size  is normal. There is mildly elevated pulmonary artery systolic pressure. The estimated right ventricular systolic pressure is 92.3 mmHg.  3. Left atrial size was severely dilated.  4. The mitral valve is abnormal. Moderate mitral valve regurgitation. Moderate mitral annular calcification.  5. Tricuspid valve regurgitation is moderate.  6. The aortic valve is tricuspid, calcified with restricted motion of the left coronary cusp. Aortic valve regurgitation is not visualized. Mild aortic valve sclerosis is present, with no evidence of aortic valve stenosis. Aortic valve mean gradient measures 6.7 mmHg.  7. The inferior vena cava is dilated in size with >50% respiratory variability, suggesting right atrial pressure of 8 mmHg. Comparison(s): Prior images reviewed side by side. LVEF similar range. FINDINGS  Left Ventricle: Left ventricular ejection fraction, by estimation, is 50 to 55%. The left ventricle has low normal function. The left ventricle demonstrates regional wall motion abnormalities. The left ventricular internal cavity size was normal in size. There is moderate concentric left ventricular hypertrophy. Left ventricular diastolic parameters are indeterminate. Elevated left ventricular end-diastolic pressure.  LV Wall Scoring: The posterior wall and basal inferior segment are hypokinetic. The entire anterior wall, antero-lateral wall, entire septum, entire apex, and mid and distal inferior wall are normal. Right Ventricle: The right ventricular size is normal. No increase in right ventricular wall thickness. Right ventricular systolic function is normal. There is mildly elevated pulmonary artery systolic pressure. The tricuspid regurgitant velocity is 2.79  m/s, and with an assumed right atrial pressure of 8 mmHg, the estimated right ventricular systolic pressure is 30.0 mmHg. Left Atrium: Left atrial size was severely dilated. Right Atrium: Right atrial size was normal in size. Pericardium: There is no evidence  of pericardial effusion. Mitral Valve: The mitral valve is abnormal. Moderate mitral annular calcification. Moderate mitral valve regurgitation, with posteriorly-directed jet. MV peak gradient, 16.5 mmHg. The mean mitral valve gradient is 6.0 mmHg. Tricuspid Valve: The tricuspid valve is grossly normal. Tricuspid valve regurgitation is moderate. Aortic Valve: The aortic valve is tricuspid. Aortic valve  regurgitation is not visualized. Mild aortic valve sclerosis is present, with no evidence of aortic valve stenosis. Aortic valve mean gradient measures 6.7 mmHg. Aortic valve peak gradient measures 12.1 mmHg. Aortic valve area, by VTI measures 1.76 cm. Pulmonic Valve: The pulmonic valve was grossly normal. Pulmonic valve regurgitation is trivial. Aorta: The aortic root is normal in size and structure. Venous: The inferior vena cava is dilated in size with greater than 50% respiratory variability, suggesting right atrial pressure of 8 mmHg. IAS/Shunts: No atrial level shunt detected by color flow Doppler. Additional Comments: A device lead is visualized.  LEFT VENTRICLE PLAX 2D LVIDd:         4.90 cm      Diastology LVIDs:         3.30 cm      LV e' lateral:   11.00 cm/s LV PW:         1.60 cm      LV E/e' lateral: 18.0 LV IVS:        1.50 cm LVOT diam:     2.10 cm LV SV:         57 LV SV Index:   27 LVOT Area:     3.46 cm  LV Volumes (MOD) LV vol d, MOD A2C: 116.0 ml LV vol d, MOD A4C: 130.0 ml LV vol s, MOD A2C: 67.7 ml LV vol s, MOD A4C: 66.3 ml LV SV MOD A2C:     48.3 ml LV SV MOD A4C:     130.0 ml LV SV MOD BP:      58.3 ml RIGHT VENTRICLE RV Basal diam:  4.30 cm RV Mid diam:    3.30 cm RV S prime:     9.00 cm/s TAPSE (M-mode): 1.8 cm LEFT ATRIUM              Index       RIGHT ATRIUM           Index LA diam:        5.00 cm  2.36 cm/m  RA Area:     24.00 cm LA Vol (A2C):   154.0 ml 72.77 ml/m RA Volume:   71.40 ml  33.74 ml/m LA Vol (A4C):   120.0 ml 56.71 ml/m LA Biplane Vol: 142.0 ml 67.10 ml/m  AORTIC  VALVE AV Area (Vmax):    1.64 cm AV Area (Vmean):   1.60 cm AV Area (VTI):     1.76 cm AV Vmax:           173.67 cm/s AV Vmean:          120.333 cm/s AV VTI:            0.324 m AV Peak Grad:      12.1 mmHg AV Mean Grad:      6.7 mmHg LVOT Vmax:         82.20 cm/s LVOT Vmean:        55.600 cm/s LVOT VTI:          0.165 m LVOT/AV VTI ratio: 0.51  AORTA Ao Root diam: 3.20 cm MITRAL VALVE                TRICUSPID VALVE MV Area (PHT): 2.84 cm     TR Peak grad:   31.1 mmHg MV Area VTI:   1.32 cm     TR Vmax:        279.00 cm/s MV Peak grad:  16.5 mmHg MV Mean grad:  6.0  mmHg     SHUNTS MV Vmax:       2.03 m/s     Systemic VTI:  0.16 m MV Vmean:      111.0 cm/s   Systemic Diam: 2.10 cm MV Decel Time: 267 msec MV E velocity: 198.00 cm/s Rozann Lesches MD Electronically signed by Rozann Lesches MD Signature Date/Time: 09/13/2021/12:31:34 PM    Final      Impression / Plan:   Impression: 1.  Anemia: Likely multifactorial with ESRD and suspected GI bleed, iron saturation low at 40%, received 1 unit of PRBCs on 1/14, hemoglobin 11.3 on 01/01/2021 --> 7.6 on 09/12/2021 --> 7.1 --> 7.3--> 7.4, started on ESA with HD, seen by Dr. Gala Romney at [facility] yesterday, patient had a small black stool on 9/15 which appeared Hemoccult positive per nursing staff, their plan was to proceed with cardiology evaluation and then GI evaluation and continue on a PPI, history of gastric ulcers in 2018, reports of melena now; likely repeat upper GI bleeding 2.  Chronic A. fib: Eliquis on hold 3.  ESRD on HD 4.  Type 2 diabetes 5.  Chest pain: 09/16/2021 echo with LVEF 50-55%, no evidence of aortic valve stenosis, troponin mildly elevated, initially started on IV heparin but this was discontinued due to GI bleed, cardiology fine following, per cardiology his chest pain.  Strictly demand related with strong type to his anemia, no plans for invasive angiography this admission, medical management was deemed the best option  Plan: 1.  Patient  will benefit from EGD for further evaluation of anemia and reported melena.  With history of upper GI bleeding and gastric ulcers in the past, likely the same now.  Patient will be scheduled for tomorrow with Dr. Lorenso Courier.  Did discuss risks, benefits, limitations and alternatives with patient and he agrees to proceed. 2.  Did briefly discuss a colonoscopy, patient is adamant that he would not like to have this procedure done.  Explained that if EGD is unrevealing we may need to rediscuss. 3.  Continue to monitor hemoglobin with transfusion as needed less than 7 4.  Continue to hold Eliquis and Heparin 5.  Agree with Pantoprazole 40 mg twice daily  Thank you for your kind consultation, we will continue to follow.  Lavone Nian Avyay Coger  09/14/2021, 1:00 PM  Addendum:  Patient has now been scheduled for EGD on Sunday due to Endo scheduling.  He can be on heart healthy diet today and tomorrow and then NPO at midnight on Sunday morning.  JLL

## 2021-09-15 DIAGNOSIS — R079 Chest pain, unspecified: Secondary | ICD-10-CM | POA: Diagnosis not present

## 2021-09-15 DIAGNOSIS — R778 Other specified abnormalities of plasma proteins: Secondary | ICD-10-CM | POA: Diagnosis not present

## 2021-09-15 DIAGNOSIS — R072 Precordial pain: Secondary | ICD-10-CM | POA: Diagnosis not present

## 2021-09-15 DIAGNOSIS — I10 Essential (primary) hypertension: Secondary | ICD-10-CM | POA: Diagnosis not present

## 2021-09-15 DIAGNOSIS — I25810 Atherosclerosis of coronary artery bypass graft(s) without angina pectoris: Secondary | ICD-10-CM | POA: Diagnosis not present

## 2021-09-15 LAB — CBC
HCT: 22.9 % — ABNORMAL LOW (ref 39.0–52.0)
Hemoglobin: 7.7 g/dL — ABNORMAL LOW (ref 13.0–17.0)
MCH: 30.7 pg (ref 26.0–34.0)
MCHC: 33.6 g/dL (ref 30.0–36.0)
MCV: 91.2 fL (ref 80.0–100.0)
Platelets: 169 10*3/uL (ref 150–400)
RBC: 2.51 MIL/uL — ABNORMAL LOW (ref 4.22–5.81)
RDW: 18.8 % — ABNORMAL HIGH (ref 11.5–15.5)
WBC: 6.5 10*3/uL (ref 4.0–10.5)
nRBC: 0 % (ref 0.0–0.2)

## 2021-09-15 LAB — RENAL FUNCTION PANEL
Albumin: 3.3 g/dL — ABNORMAL LOW (ref 3.5–5.0)
Anion gap: 13 (ref 5–15)
BUN: 26 mg/dL — ABNORMAL HIGH (ref 6–20)
CO2: 25 mmol/L (ref 22–32)
Calcium: 8.9 mg/dL (ref 8.9–10.3)
Chloride: 95 mmol/L — ABNORMAL LOW (ref 98–111)
Creatinine, Ser: 6.21 mg/dL — ABNORMAL HIGH (ref 0.61–1.24)
GFR, Estimated: 10 mL/min — ABNORMAL LOW (ref >60)
Glucose, Bld: 75 mg/dL (ref 70–99)
Phosphorus: 4.4 mg/dL (ref 2.5–4.6)
Potassium: 4.4 mmol/L (ref 3.5–5.1)
Sodium: 133 mmol/L — ABNORMAL LOW (ref 135–145)

## 2021-09-15 LAB — GLUCOSE, CAPILLARY
Glucose-Capillary: 84 mg/dL (ref 70–99)
Glucose-Capillary: 114 mg/dL — ABNORMAL HIGH (ref 70–99)
Glucose-Capillary: 119 mg/dL — ABNORMAL HIGH (ref 70–99)
Glucose-Capillary: 139 mg/dL — ABNORMAL HIGH (ref 70–99)
Glucose-Capillary: 139 mg/dL — ABNORMAL HIGH (ref 70–99)

## 2021-09-15 MED ORDER — ISOSORBIDE MONONITRATE ER 30 MG PO TB24
15.0000 mg | ORAL_TABLET | Freq: Every day | ORAL | Status: DC
  Administered 2021-09-15 – 2021-09-18 (×4): 15 mg via ORAL
  Filled 2021-09-15 (×4): qty 1

## 2021-09-15 NOTE — Progress Notes (Addendum)
Cardiology Progress Note  Patient ID: Shaun Burns MRN: [id number] DOB: [birth year] Date of Encounter: 09/15/2021  Primary Cardiologist: Carlyle Dolly, MD  Subjective   Chief Complaint: None.  HPI: Denies chest pain.  Plans for EGD on 9/18.  Hemoglobin stable.  ROS:  All other ROS reviewed and negative. Pertinent positives noted in the HPI.     Inpatient Medications  Scheduled Meds:  amiodarone  200 mg Oral Daily   atorvastatin  40 mg Oral QPM   Chlorhexidine Gluconate Cloth  6 each Topical Q0600   Chlorhexidine Gluconate Cloth  6 each Topical Q0600   darbepoetin (ARANESP) injection - DIALYSIS  60 mcg Intravenous Q Fri-HD   hydrALAZINE  25 mg Oral Q8H   influenza vac split quadrivalent PF  0.5 mL Intramuscular Tomorrow-1000   insulin aspart  0-6 Units Subcutaneous TID WC   metoprolol succinate  50 mg Oral Daily   midodrine  10 mg Oral TID WC   mupirocin ointment  1 application Nasal BID   pantoprazole  40 mg Oral BID AC   sevelamer carbonate  800 mg Oral TID WC   Continuous Infusions:  sodium chloride     PRN Meds: acetaminophen **OR** acetaminophen, albuterol, bisacodyl, fentaNYL (SUBLIMAZE) injection, nicotine, nitroGLYCERIN, ondansetron **OR** ondansetron (ZOFRAN) IV   Vital Signs   Vitals:   09/14/21 2119 09/15/21 0014 09/15/21 0448 09/15/21 0520  BP: (!) 136/57 (!) 119/49 (!) 99/48 110/73  Pulse:  74 80 80  Resp:  19 16 18   Temp:  98.1 F (36.7 C) 98.6 F (37 C) 98.6 F (37 C)  TempSrc:  Oral Oral Oral  SpO2:  100% 100% 100%  Weight:      Height:        Intake/Output Summary (Last 24 hours) at 09/15/2021 0804 Last data filed at 09/14/2021 1815 Gross per 24 hour  Intake 120 ml  Output 2000 ml  Net -1880 ml   Last 3 Weights 09/14/2021 09/14/2021 09/13/2021  Weight (lbs) 208 lb 8.9 oz 213 lb 6.5 oz 209 lb 6.4 oz  Weight (kg) 94.6 kg 96.8 kg 94.983 kg      Telemetry  Overnight telemetry shows A. fib heart rate in the 80s, which I personally reviewed.    Physical Exam   Vitals:   09/14/21 2119 09/15/21 0014 09/15/21 0448 09/15/21 0520  BP: (!) 136/57 (!) 119/49 (!) 99/48 110/73  Pulse:  74 80 80  Resp:  19 16 18   Temp:  98.1 F (36.7 C) 98.6 F (37 C) 98.6 F (37 C)  TempSrc:  Oral Oral Oral  SpO2:  100% 100% 100%  Weight:      Height:        Intake/Output Summary (Last 24 hours) at 09/15/2021 0804 Last data filed at 09/14/2021 1815 Gross per 24 hour  Intake 120 ml  Output 2000 ml  Net -1880 ml    Last 3 Weights 09/14/2021 09/14/2021 09/13/2021  Weight (lbs) 208 lb 8.9 oz 213 lb 6.5 oz 209 lb 6.4 oz  Weight (kg) 94.6 kg 96.8 kg 94.983 kg    Body mass index is 29.92 kg/m.  General: Well nourished, well developed, in no acute distress Head: Atraumatic, normal size  Eyes: PEERLA, EOMI  Neck: Supple, no JVD Endocrine: No thryomegaly Cardiac: Normal S1, S2; irregular rhythm, 2 out of 6 systolic ejection murmur Lungs: Clear to auscultation bilaterally, no wheezing, rhonchi or rales  Abd: Soft, nontender, no hepatomegaly  Ext: No edema, pulses 2+ Musculoskeletal: No deformities, BUE  and BLE strength normal and equal Skin: Warm and dry, no rashes   Neuro: Alert and oriented to person, place, time, and situation, CNII-XII grossly intact, no focal deficits  Psych: Normal mood and affect   Labs  High Sensitivity Troponin:   Recent Labs  Lab 09/12/21 1912 09/12/21 2054 09/12/21 2318 09/13/21 0211 09/13/21 0433  TROPONINIHS 458* 537* 513* 512* 600*     Cardiac EnzymesNo results for input(s): TROPONINI in the last 168 hours. No results for input(s): TROPIPOC in the last 168 hours.  Chemistry Recent Labs  Lab 09/13/21 0433 09/14/21 0140 09/15/21 0259  NA 134* 133* 133*  K 3.8 4.5 4.4  CL 95* 93* 95*  CO2 30 29 25   GLUCOSE 90 103* 75  BUN 34* 47* 26*  CREATININE 6.90* 8.88* 6.21*  CALCIUM 8.2* 9.3 8.9  PROT 6.0*  --   --   ALBUMIN 3.5  3.4* 3.3* 3.3*  AST 14*  --   --   ALT 18  --   --   ALKPHOS 36*  --   --    BILITOT 0.6  --   --   GFRNONAA 9* 7* 10*  ANIONGAP 9 11 13     Hematology Recent Labs  Lab 09/14/21 0140 09/14/21 1112 09/15/21 0259  WBC 7.6 7.0 6.5  RBC 2.48* 2.46* 2.51*  HGB 7.3* 7.4* 7.7*  HCT 22.9* 23.0* 22.9*  MCV 92.3 93.5 91.2  MCH 29.4 30.1 30.7  MCHC 31.9 32.2 33.6  RDW 18.8* 19.0* 18.8*  PLT 161 167 169   BNPNo results for input(s): BNP, PROBNP in the last 168 hours.  DDimer No results for input(s): DDIMER in the last 168 hours.   Radiology  ECHOCARDIOGRAM COMPLETE  Result Date: 09/13/2021    ECHOCARDIOGRAM REPORT   Patient Name:   Shaun Burns Date of Exam: 09/13/2021 Medical Rec #:  [id number]     Height:       70.0 in Accession #:    [id number]    Weight:       206.6 lb Date of Birth:  [birth year]      BSA:          2.116 m Patient Age:    50 years      BP:           101/60 mmHg Patient Gender: M             HR:           71 bpm. Exam Location:  [facility] Procedure: 2D Echo, Cardiac Doppler and Color Doppler Indications:    Chest Pain  History:        Patient has prior history of Echocardiogram examinations, most                 recent 10/17/2020. CAD and Previous Myocardial Infarction,                 Defibrillator, Abnormal ECG and Prior CABG, Arrythmias:Atrial                 Fibrillation, Signs/Symptoms:Chest Pain; Risk Factors:Tobacco                 abuse, Hypertension and Diabetes.  Sonographer:    Wenda Low Referring Phys: [place]  1. Left ventricular ejection fraction, by estimation, is 50 to 55%. The left ventricle has low normal function. The left ventricle demonstrates regional wall motion abnormalities (see scoring diagram/findings for description). There is moderate  concentric left ventricular hypertrophy. Left ventricular diastolic parameters are indeterminate. Elevated left ventricular end-diastolic pressure.  2. Right ventricular systolic function is normal. The right ventricular size is normal. There is mildly elevated  pulmonary artery systolic pressure. The estimated right ventricular systolic pressure is 76.2 mmHg.  3. Left atrial size was severely dilated.  4. The mitral valve is abnormal. Moderate mitral valve regurgitation. Moderate mitral annular calcification.  5. Tricuspid valve regurgitation is moderate.  6. The aortic valve is tricuspid, calcified with restricted motion of the left coronary cusp. Aortic valve regurgitation is not visualized. Mild aortic valve sclerosis is present, with no evidence of aortic valve stenosis. Aortic valve mean gradient measures 6.7 mmHg.  7. The inferior vena cava is dilated in size with >50% respiratory variability, suggesting right atrial pressure of 8 mmHg. Comparison(s): Prior images reviewed side by side. LVEF similar range. FINDINGS  Left Ventricle: Left ventricular ejection fraction, by estimation, is 50 to 55%. The left ventricle has low normal function. The left ventricle demonstrates regional wall motion abnormalities. The left ventricular internal cavity size was normal in size. There is moderate concentric left ventricular hypertrophy. Left ventricular diastolic parameters are indeterminate. Elevated left ventricular end-diastolic pressure.  LV Wall Scoring: The posterior wall and basal inferior segment are hypokinetic. The entire anterior wall, antero-lateral wall, entire septum, entire apex, and mid and distal inferior wall are normal. Right Ventricle: The right ventricular size is normal. No increase in right ventricular wall thickness. Right ventricular systolic function is normal. There is mildly elevated pulmonary artery systolic pressure. The tricuspid regurgitant velocity is 2.79  m/s, and with an assumed right atrial pressure of 8 mmHg, the estimated right ventricular systolic pressure is 83.1 mmHg. Left Atrium: Left atrial size was severely dilated. Right Atrium: Right atrial size was normal in size. Pericardium: There is no evidence of pericardial effusion. Mitral  Valve: The mitral valve is abnormal. Moderate mitral annular calcification. Moderate mitral valve regurgitation, with posteriorly-directed jet. MV peak gradient, 16.5 mmHg. The mean mitral valve gradient is 6.0 mmHg. Tricuspid Valve: The tricuspid valve is grossly normal. Tricuspid valve regurgitation is moderate. Aortic Valve: The aortic valve is tricuspid. Aortic valve regurgitation is not visualized. Mild aortic valve sclerosis is present, with no evidence of aortic valve stenosis. Aortic valve mean gradient measures 6.7 mmHg. Aortic valve peak gradient measures 12.1 mmHg. Aortic valve area, by VTI measures 1.76 cm. Pulmonic Valve: The pulmonic valve was grossly normal. Pulmonic valve regurgitation is trivial. Aorta: The aortic root is normal in size and structure. Venous: The inferior vena cava is dilated in size with greater than 50% respiratory variability, suggesting right atrial pressure of 8 mmHg. IAS/Shunts: No atrial level shunt detected by color flow Doppler. Additional Comments: A device lead is visualized.  LEFT VENTRICLE PLAX 2D LVIDd:         4.90 cm      Diastology LVIDs:         3.30 cm      LV e' lateral:   11.00 cm/s LV PW:         1.60 cm      LV E/e' lateral: 18.0 LV IVS:        1.50 cm LVOT diam:     2.10 cm LV SV:         57 LV SV Index:   27 LVOT Area:     3.46 cm  LV Volumes (MOD) LV vol d, MOD A2C: 116.0 ml LV vol d, MOD A4C:  130.0 ml LV vol s, MOD A2C: 67.7 ml LV vol s, MOD A4C: 66.3 ml LV SV MOD A2C:     48.3 ml LV SV MOD A4C:     130.0 ml LV SV MOD BP:      58.3 ml RIGHT VENTRICLE RV Basal diam:  4.30 cm RV Mid diam:    3.30 cm RV S prime:     9.00 cm/s TAPSE (M-mode): 1.8 cm LEFT ATRIUM              Index       RIGHT ATRIUM           Index LA diam:        5.00 cm  2.36 cm/m  RA Area:     24.00 cm LA Vol (A2C):   154.0 ml 72.77 ml/m RA Volume:   71.40 ml  33.74 ml/m LA Vol (A4C):   120.0 ml 56.71 ml/m LA Biplane Vol: 142.0 ml 67.10 ml/m  AORTIC VALVE AV Area (Vmax):    1.64 cm  AV Area (Vmean):   1.60 cm AV Area (VTI):     1.76 cm AV Vmax:           173.67 cm/s AV Vmean:          120.333 cm/s AV VTI:            0.324 m AV Peak Grad:      12.1 mmHg AV Mean Grad:      6.7 mmHg LVOT Vmax:         82.20 cm/s LVOT Vmean:        55.600 cm/s LVOT VTI:          0.165 m LVOT/AV VTI ratio: 0.51  AORTA Ao Root diam: 3.20 cm MITRAL VALVE                TRICUSPID VALVE MV Area (PHT): 2.84 cm     TR Peak grad:   31.1 mmHg MV Area VTI:   1.32 cm     TR Vmax:        279.00 cm/s MV Peak grad:  16.5 mmHg MV Mean grad:  6.0 mmHg     SHUNTS MV Vmax:       2.03 m/s     Systemic VTI:  0.16 m MV Vmean:      111.0 cm/s   Systemic Diam: 2.10 cm MV Decel Time: 267 msec MV E velocity: 198.00 cm/s Rozann Lesches MD Electronically signed by Rozann Lesches MD Signature Date/Time: 09/13/2021/12:31:34 PM    Final     Cardiac Studies  TTE 09/13/2021  1. Left ventricular ejection fraction, by estimation, is 50 to 55%. The  left ventricle has low normal function. The left ventricle demonstrates  regional wall motion abnormalities (see scoring diagram/findings for  description). There is moderate  concentric left ventricular hypertrophy. Left ventricular diastolic  parameters are indeterminate. Elevated left ventricular end-diastolic  pressure.   2. Right ventricular systolic function is normal. The right ventricular  size is normal. There is mildly elevated pulmonary artery systolic  pressure. The estimated right ventricular systolic pressure is 54.6 mmHg.   3. Left atrial size was severely dilated.   4. The mitral valve is abnormal. Moderate mitral valve regurgitation.  Moderate mitral annular calcification.   5. Tricuspid valve regurgitation is moderate.   6. The aortic valve is tricuspid, calcified with restricted motion of the  left coronary cusp. Aortic valve regurgitation is not visualized. Mild  aortic  valve sclerosis is present, with no evidence of aortic valve  stenosis. Aortic valve mean  gradient  measures 6.7 mmHg.   7. The inferior vena cava is dilated in size with >50% respiratory  variability, suggesting right atrial pressure of 8 mmHg.   Left heart cath 02/12/2021 @ [facility] 100% occluded RCA Occluded vein graft RCA Patent LIMA to LAD with 99% mid LAD stenosis after the vein graft insertion (not amendable to PCI) Small vessel disease in the left circumflex    Patient Profile  50 year old male with history of persistent atrial fibrillation on Eliquis, ESRD on hemodialysis, hypertension, 2 vessel CAD status post CABG (LIMA to LAD, vein graft to RCA), sustained ventricular tachycardia status post ICD, GI bleed who was admitted on 09/12/2021 with chest pain in the setting of worsening anemia.  Assessment & Plan   #Non-STEMI #Demand ischemia in the setting of anemia #Three-vessel CAD -Very severe three-vessel CAD from left heart cath in February 2022.  RCA is occluded.  Vein graft RCA is occluded.  Patent LIMA to LAD however he has a significant stenosis in the mid LAD after the anastomosis. -Now admitted with symptoms of chest pain for 2 weeks.  This occurs with a drop in hemoglobin down to 7.6 from 11. -Troponins were elevated but flat.  EKG with anterolateral ST depressions. -This is likely just demand ischemia.  He has known three-vessel CAD.  I suspect in the setting of anemia he is having more angina.  Would recommend hold aspirin and heparin.  He needs to have GI evaluation which she will do tomorrow. -My recommendation will be medical management. -Continue high intensity statin. -He is on a beta-blocker and Imdur.  #Ischemic cardiomyopathy, EF 50-55% -EF improved.  Wall motion abnormalities in the basal inferior segments. -Continue metoprolol succinate. -He apparently has hypotension with dialysis and is on midodrine.  Would hold hydralazine.  We have had him on Imdur for angina.  We will continue this as we are able. -Not a candidate for ACE/ARB/Arni/MRA given ESRD.   No SGLT2 inhibitor.  #VT status post ICD -No issues.  #Persistent atrial fibrillation -Holding Eliquis. -Rate control without issues.  #Anemia -Hemoglobin stable.  Will transfuse to maintain hemoglobin above 8.  He is close to this. -GI evaluation tomorrow.  For questions or updates, please contact [place] Please consult [email or web address] for contact info under   Time Spent with Patient: I have spent a total of 35 minutes with patient reviewing hospital notes, telemetry, EKGs, labs and examining the patient as well as establishing an assessment and plan that was discussed with the patient.  > 50% of time was spent in direct patient care.    Signed, Addison Naegeli. Audie Box, MD, [place]  09/15/2021 8:04 AM

## 2021-09-15 NOTE — Progress Notes (Signed)
PROGRESS NOTE   Shaun Burns  XAJ:[id number] DOB: 06/23/1971 DOA: 09/12/2021 PCP: Shaun Sites, PA-C  Brief Narrative:   50 year old black male permanent A. 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-99% CTO mid LAD-(inability to perform PCI of mid LAD at [facility]), sustained VT + syncope + Biotronik ICD placement 01/2021-EF 40-45% at that time ESRD MWF DM TY 2 HTN Chronic tobacco Presented from dialysis center 9/14 ongoing chest pain about a week numbness over central anterior chest wall-on presentation to ED hemoglobin down from 11-7.6-patient refused to guaiac--no reported melena hematemesis-prior history of gastric ulcer 06/2017 [facility] follow-up EGD gastric erosions Initial troponin was 98 but EKG showed ST depression in lateral leads  Developed hypotension, troponin bumped to the 600 range concerning for unstable angina, had 1 dark stool Elevated troponins and, ST depressions prompted transfer to [facility] for consideration of cardiac cath-Heparin was stopped by cardiology   Hospital-Problem based course  NSTEMI-prior CAD CABG 2019 inability to perform PCI to mid LAD at [place] cath candidate-Heparin discontinued 2/2 risk of bleed-defer to cardiology Continuing Toprol-XL 50 daily hydralazine 25 every 8 atorvastatin 40 sublingual nitroglycerin--? Eventual no meds on dialysis days because of hypotension see below Imdur 60 on hold, aspirin twice daily currently held secondary to bleed Cardiology recommending medical management, feel NSTEMI secondary to demand ischemia secondary to low blood count known CAD Probable upper GI bleed Small black stool yesterday-hemoglobin baseline 11 currently 7.7 Transfusion threshold below 7 Holding Eliquis at this time ?  EGD a.m. per GI, continue Protonix 40 twice daily ESRD MWF Chronic hypotension-continue midodrine 10 mg 3 times daily Iron  saturation ratios are good Aranesp as per renal Continue Renvela 800 3 times daily Permanent A. 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 Continue amiodarone 200 daily-Eliquis 5 twice daily on hold secondary to GI bleed He has some PVCs on the monitor but they are not severe or significant HFrEF on last echo Volume management with dialysis Is anuric at baseline DM TY 2 Not on PTA meds--CBG ranging 84-1 14 eating 100% of meals continue sliding-scale coverage Outpatient reeval   DVT prophylaxis: SCD Code Status: Full Family Communication: None present Disposition:  Status is: Inpatient  Remains inpatient appropriate because:Ongoing active pain requiring inpatient pain management and Ongoing diagnostic testing needed not appropriate for outpatient work up  Dispo: The patient is from: Home              Anticipated d/c is to: Home              Patient currently is not medically stable to d/c.   Difficult to place patient No       Consultants:  Gastroenterology Renal Cardiology  Procedures: None yet  Antimicrobials: No   Subjective:  1 further dark formed stool subsequent to my evaluation of him on the HD unit yesterday 9/16 No chest pain no fever at this time and no stool overnight Just finished breakfast no rales no rhonchi no weakness is ambulatory  Objective: Vitals:   09/15/21 0014 09/15/21 0448 09/15/21 0520 09/15/21 0811  BP: (!) 119/49 (!) 99/48 110/73 (!) 104/56  Pulse: 74 80 80 84  Resp: 19 16 18    Temp: 98.1 F (36.7 C) 98.6 F (37 C) 98.6 F (37 C)   TempSrc: Oral Oral Oral   SpO2: 100% 100% 100% 100%  Weight:      Height:  Intake/Output Summary (Last 24 hours) at 09/15/2021 0859 Last data filed at 09/14/2021 1815 Gross per 24 hour  Intake 120 ml  Output 2000 ml  Net -1880 ml   Filed Weights   09/13/21 1909 09/14/21 1355 09/14/21 1815  Weight: 95 kg 96.8 kg 94.6 kg    Examination:  Pleasant coherent EOMI NCAT no focal  deficit CTA B no added sound no rales no rhonchi Slightly thick neck No murmur no rub no gallop-monitor shows some PVCs Abdomen is soft no rebound no guarding no epigastric tenderness no hepatosplenomegaly no distention No lower extremity edema no rash Neurologically intact overall moving 4 limbs without deficit rest of exam deferred Psych euthymic congruent pleasant  Data Reviewed: personally reviewed   CBC    Component Value Date/Time   WBC 6.5 09/15/2021 0259   RBC 2.51 (L) 09/15/2021 0259   HGB 7.7 (L) 09/15/2021 0259   HCT 22.9 (L) 09/15/2021 0259   PLT 169 09/15/2021 0259   MCV 91.2 09/15/2021 0259   MCH 30.7 09/15/2021 0259   MCHC 33.6 09/15/2021 0259   RDW 18.8 (H) 09/15/2021 0259   LYMPHSABS 1.5 01/01/2021 0914   MONOABS 0.6 01/01/2021 0914   EOSABS 0.1 01/01/2021 0914   BASOSABS 0.0 01/01/2021 0914   CMP Latest Ref Rng & Units 09/15/2021 09/14/2021 09/13/2021  Glucose 70 - 99 mg/dL 75 103(H) 90  BUN 6 - 20 mg/dL 26(H) 47(H) 34(H)  Creatinine 0.61 - 1.24 mg/dL 6.21(H) 8.88(H) 6.90(H)  Sodium 135 - 145 mmol/L 133(L) 133(L) 134(L)  Potassium 3.5 - 5.1 mmol/L 4.4 4.5 3.8  Chloride 98 - 111 mmol/L 95(L) 93(L) 95(L)  CO2 22 - 32 mmol/L 25 29 30   Calcium 8.9 - 10.3 mg/dL 8.9 9.3 8.2(L)  Total Protein 6.5 - 8.1 g/dL - - 6.0(L)  Total Bilirubin 0.3 - 1.2 mg/dL - - 0.6  Alkaline Phos 38 - 126 U/L - - 36(L)  AST 15 - 41 U/L - - 14(L)  ALT 0 - 44 U/L - - 18     Radiology Studies: ECHOCARDIOGRAM COMPLETE  Result Date: 09/13/2021    ECHOCARDIOGRAM REPORT   Patient Name:   Shaun Burns Date of Exam: 09/13/2021 Medical Rec #:  [id number]     Height:       70.0 in Accession #:    [id number]    Weight:       206.6 lb Date of Birth:  1971-03-30      BSA:          2.116 m Patient Age:    32 years      BP:           101/60 mmHg Patient Gender: M             HR:           71 bpm. Exam Location:  [facility] Procedure: 2D Echo, Cardiac Doppler and Color Doppler Indications:    Chest Pain   History:        Patient has prior history of Echocardiogram examinations, most                 recent 10/17/2020. CAD and Previous Myocardial Infarction,                 Defibrillator, Abnormal ECG and Prior CABG, Arrythmias:Atrial                 Fibrillation, Signs/Symptoms:Chest Pain; Risk Factors:Tobacco  abuse, Hypertension and Diabetes.  Sonographer:    Wenda Low Referring Phys: [address]  1. Left ventricular ejection fraction, by estimation, is 50 to 55%. The left ventricle has low normal function. The left ventricle demonstrates regional wall motion abnormalities (see scoring diagram/findings for description). There is moderate concentric left ventricular hypertrophy. Left ventricular diastolic parameters are indeterminate. Elevated left ventricular end-diastolic pressure.  2. Right ventricular systolic function is normal. The right ventricular size is normal. There is mildly elevated pulmonary artery systolic pressure. The estimated right ventricular systolic pressure is 85.6 mmHg.  3. Left atrial size was severely dilated.  4. The mitral valve is abnormal. Moderate mitral valve regurgitation. Moderate mitral annular calcification.  5. Tricuspid valve regurgitation is moderate.  6. The aortic valve is tricuspid, calcified with restricted motion of the left coronary cusp. Aortic valve regurgitation is not visualized. Mild aortic valve sclerosis is present, with no evidence of aortic valve stenosis. Aortic valve mean gradient measures 6.7 mmHg.  7. The inferior vena cava is dilated in size with >50% respiratory variability, suggesting right atrial pressure of 8 mmHg. Comparison(s): Prior images reviewed side by side. LVEF similar range. FINDINGS  Left Ventricle: Left ventricular ejection fraction, by estimation, is 50 to 55%. The left ventricle has low normal function. The left ventricle demonstrates regional wall motion abnormalities. The left ventricular  internal cavity size was normal in size. There is moderate concentric left ventricular hypertrophy. Left ventricular diastolic parameters are indeterminate. Elevated left ventricular end-diastolic pressure.  LV Wall Scoring: The posterior wall and basal inferior segment are hypokinetic. The entire anterior wall, antero-lateral wall, entire septum, entire apex, and mid and distal inferior wall are normal. Right Ventricle: The right ventricular size is normal. No increase in right ventricular wall thickness. Right ventricular systolic function is normal. There is mildly elevated pulmonary artery systolic pressure. The tricuspid regurgitant velocity is 2.79  m/s, and with an assumed right atrial pressure of 8 mmHg, the estimated right ventricular systolic pressure is 31.4 mmHg. Left Atrium: Left atrial size was severely dilated. Right Atrium: Right atrial size was normal in size. Pericardium: There is no evidence of pericardial effusion. Mitral Valve: The mitral valve is abnormal. Moderate mitral annular calcification. Moderate mitral valve regurgitation, with posteriorly-directed jet. MV peak gradient, 16.5 mmHg. The mean mitral valve gradient is 6.0 mmHg. Tricuspid Valve: The tricuspid valve is grossly normal. Tricuspid valve regurgitation is moderate. Aortic Valve: The aortic valve is tricuspid. Aortic valve regurgitation is not visualized. Mild aortic valve sclerosis is present, with no evidence of aortic valve stenosis. Aortic valve mean gradient measures 6.7 mmHg. Aortic valve peak gradient measures 12.1 mmHg. Aortic valve area, by VTI measures 1.76 cm. Pulmonic Valve: The pulmonic valve was grossly normal. Pulmonic valve regurgitation is trivial. Aorta: The aortic root is normal in size and structure. Venous: The inferior vena cava is dilated in size with greater than 50% respiratory variability, suggesting right atrial pressure of 8 mmHg. IAS/Shunts: No atrial level shunt detected by color flow Doppler.  Additional Comments: A device lead is visualized.  LEFT VENTRICLE PLAX 2D LVIDd:         4.90 cm      Diastology LVIDs:         3.30 cm      LV e' lateral:   11.00 cm/s LV PW:         1.60 cm      LV E/e' lateral: 18.0 LV IVS:  1.50 cm LVOT diam:     2.10 cm LV SV:         57 LV SV Index:   27 LVOT Area:     3.46 cm  LV Volumes (MOD) LV vol d, MOD A2C: 116.0 ml LV vol d, MOD A4C: 130.0 ml LV vol s, MOD A2C: 67.7 ml LV vol s, MOD A4C: 66.3 ml LV SV MOD A2C:     48.3 ml LV SV MOD A4C:     130.0 ml LV SV MOD BP:      58.3 ml RIGHT VENTRICLE RV Basal diam:  4.30 cm RV Mid diam:    3.30 cm RV S prime:     9.00 cm/s TAPSE (M-mode): 1.8 cm LEFT ATRIUM              Index       RIGHT ATRIUM           Index LA diam:        5.00 cm  2.36 cm/m  RA Area:     24.00 cm LA Vol (A2C):   154.0 ml 72.77 ml/m RA Volume:   71.40 ml  33.74 ml/m LA Vol (A4C):   120.0 ml 56.71 ml/m LA Biplane Vol: 142.0 ml 67.10 ml/m  AORTIC VALVE AV Area (Vmax):    1.64 cm AV Area (Vmean):   1.60 cm AV Area (VTI):     1.76 cm AV Vmax:           173.67 cm/s AV Vmean:          120.333 cm/s AV VTI:            0.324 m AV Peak Grad:      12.1 mmHg AV Mean Grad:      6.7 mmHg LVOT Vmax:         82.20 cm/s LVOT Vmean:        55.600 cm/s LVOT VTI:          0.165 m LVOT/AV VTI ratio: 0.51  AORTA Ao Root diam: 3.20 cm MITRAL VALVE                TRICUSPID VALVE MV Area (PHT): 2.84 cm     TR Peak grad:   31.1 mmHg MV Area VTI:   1.32 cm     TR Vmax:        279.00 cm/s MV Peak grad:  16.5 mmHg MV Mean grad:  6.0 mmHg     SHUNTS MV Vmax:       2.03 m/s     Systemic VTI:  0.16 m MV Vmean:      111.0 cm/s   Systemic Diam: 2.10 cm MV Decel Time: 267 msec MV E velocity: 198.00 cm/s Rozann Lesches MD Electronically signed by Rozann Lesches MD Signature Date/Time: 09/13/2021/12:31:34 PM    Final      Scheduled Meds:  amiodarone  200 mg Oral Daily   atorvastatin  40 mg Oral QPM   Chlorhexidine Gluconate Cloth  6 each Topical Q0600   Chlorhexidine  Gluconate Cloth  6 each Topical Q0600   darbepoetin (ARANESP) injection - DIALYSIS  60 mcg Intravenous Q Fri-HD   hydrALAZINE  25 mg Oral Q8H   influenza vac split quadrivalent PF  0.5 mL Intramuscular Tomorrow-1000   insulin aspart  0-6 Units Subcutaneous TID WC   isosorbide mononitrate  15 mg Oral Daily   metoprolol succinate  50 mg Oral Daily   midodrine  10 mg  Oral TID WC   mupirocin ointment  1 application Nasal BID   pantoprazole  40 mg Oral BID AC   sevelamer carbonate  800 mg Oral TID WC   Continuous Infusions:  sodium chloride       LOS: 3 days   Time spent: [place], MD [facility] To contact the attending provider between 7A-7P or the covering provider during after hours 7P-7A, please log into the web site [email or web address] and access using universal [place] password for that web site. If you do not have the password, please call the hospital operator.  09/15/2021, 8:59 AM

## 2021-09-15 NOTE — Progress Notes (Signed)
[facility] NEPHROLOGY PROGRESS NOTE  Assessment/ Plan: Pt is a 50 y.o. yo male  with history of hypertension, HLD, DM, CAD status post CABG, ESRD on HD MWF at [facility] presented with chest pain, seen as a consultation for the management of ESRD.   OP HD: [name], MWF, LUE AVG.  #Chest pain/unstable angina: Troponin mildly elevated.  Plan to discontinue IV heparin because of GI bleed.  Cardiology is following, pending echo.  Started on mdur, metoprolol.  Not sure if patient can actually tolerate this medication because of hypotension. -medical mgmt   # ESRD: MWF at [place].  Status post HD on 9/14 for 4 hours, unable to UF because of hypotension despite of lowering dialysate temperature and use of albumin.  Started on midodrine 10 mg 3 times daily.  Next HD on mon if still here  #Hypotension/volume: On cardiac medication for angina.  Midodrine started as above.  UF as tolerated.  Patient reports chronic hypotension as outpatient as well.   # Anemia of ESRD, GIB: Iron saturation 40%.  Received a unit of PRBC on 9/14.  Start ESA with HD.  Monitor hemoglobin and transfuse as needed. GI on board, egd 9/18 possibly   # Metabolic Bone Disease: Calcium and phosphorus level at goal.   Subjective: . No acute events, no complaints. Denies CP.  Tolerated HD yesterday, net UF 2 L Objective Vital signs in last 24 hours: Vitals:   09/15/21 0448 09/15/21 0520 09/15/21 0811 09/15/21 0922  BP: (!) 99/48 110/73 (!) 104/56 (!) 106/49  Pulse: 80 80 84 85  Resp: 16 18    Temp: 98.6 F (37 C) 98.6 F (37 C)    TempSrc: Oral Oral    SpO2: 100% 100% 100%   Weight:      Height:       Weight change: 1.817 kg  Intake/Output Summary (Last 24 hours) at 09/15/2021 1151 Last data filed at 09/14/2021 1815 Gross per 24 hour  Intake --  Output 2000 ml  Net -2000 ml       Labs: Basic Metabolic Panel: Recent Labs  Lab 09/13/21 0433 09/14/21 0140 09/15/21 0259  NA 134* 133* 133*   K 3.8 4.5 4.4  CL 95* 93* 95*  CO2 30 29 25   GLUCOSE 90 103* 75  BUN 34* 47* 26*  CREATININE 6.90* 8.88* 6.21*  CALCIUM 8.2* 9.3 8.9  PHOS 4.7* 6.1* 4.4   Liver Function Tests: Recent Labs  Lab 09/13/21 0433 09/14/21 0140 09/15/21 0259  AST 14*  --   --   ALT 18  --   --   ALKPHOS 36*  --   --   BILITOT 0.6  --   --   PROT 6.0*  --   --   ALBUMIN 3.5  3.4* 3.3* 3.3*   No results for input(s): LIPASE, AMYLASE in the last 168 hours. No results for input(s): AMMONIA in the last 168 hours. CBC: Recent Labs  Lab 09/12/21 0903 09/13/21 0433 09/14/21 0140 09/14/21 1112 09/15/21 0259  WBC 8.1 4.8 7.6 7.0 6.5  HGB 7.6* 7.1* 7.3* 7.4* 7.7*  HCT 24.6* 22.0* 22.9* 23.0* 22.9*  MCV 98.4 94.0 92.3 93.5 91.2  PLT 188 136* 161 167 169   Cardiac Enzymes: No results for input(s): CKTOTAL, CKMB, CKMBINDEX, TROPONINI in the last 168 hours. CBG: Recent Labs  Lab 09/14/21 1142 09/14/21 2119 09/15/21 0518 09/15/21 0732 09/15/21 1045  GLUCAP 115* 136* 84 114* 119*    Iron Studies:  No results for input(s): IRON, TIBC, TRANSFERRIN, FERRITIN in the last 72 hours.  Studies/Results: No results found.  Medications: Infusions:  sodium chloride      Scheduled Medications:  amiodarone  200 mg Oral Daily   atorvastatin  40 mg Oral QPM   Chlorhexidine Gluconate Cloth  6 each Topical Q0600   Chlorhexidine Gluconate Cloth  6 each Topical Q0600   darbepoetin (ARANESP) injection - DIALYSIS  60 mcg Intravenous Q Fri-HD   hydrALAZINE  25 mg Oral Q8H   influenza vac split quadrivalent PF  0.5 mL Intramuscular Tomorrow-1000   insulin aspart  0-6 Units Subcutaneous TID WC   isosorbide mononitrate  15 mg Oral Daily   metoprolol succinate  50 mg Oral Daily   midodrine  10 mg Oral TID WC   mupirocin ointment  1 application Nasal BID   pantoprazole  40 mg Oral BID AC   sevelamer carbonate  800 mg Oral TID WC    have reviewed scheduled and prn medications.  Physical  Exam: General:NAD, comfortable Heart:RRR, s1s2 nl Lungs: normal wob Abdomen:soft, Non-tender, non-distended Extremities:No edema Dialysis Access: Left AVG has good thrill and bruit.  [name] 09/15/2021,11:51 AM  LOS: 3 days

## 2021-09-16 ENCOUNTER — Encounter (HOSPITAL_COMMUNITY): Payer: Self-pay | Admitting: Family Medicine

## 2021-09-16 ENCOUNTER — Encounter (HOSPITAL_COMMUNITY)
Admission: EM | Disposition: A | Discharge status: Home / Self Care | Payer: Self-pay | Source: Ambulatory Visit | Attending: Family Medicine

## 2021-09-16 ENCOUNTER — Inpatient Hospital Stay (HOSPITAL_COMMUNITY): Payer: Medicare Other | Admitting: Registered Nurse

## 2021-09-16 DIAGNOSIS — I208 Other forms of angina pectoris: Secondary | ICD-10-CM | POA: Diagnosis not present

## 2021-09-16 DIAGNOSIS — I25810 Atherosclerosis of coronary artery bypass graft(s) without angina pectoris: Secondary | ICD-10-CM | POA: Diagnosis not present

## 2021-09-16 DIAGNOSIS — K3189 Other diseases of stomach and duodenum: Secondary | ICD-10-CM

## 2021-09-16 DIAGNOSIS — R072 Precordial pain: Secondary | ICD-10-CM | POA: Diagnosis not present

## 2021-09-16 HISTORY — PX: ESOPHAGOGASTRODUODENOSCOPY (EGD) WITH PROPOFOL: SHX5813

## 2021-09-16 LAB — PREPARE RBC (CROSSMATCH)

## 2021-09-16 LAB — RENAL FUNCTION PANEL
Albumin: 3.3 g/dL — ABNORMAL LOW (ref 3.5–5.0)
Anion gap: 12 (ref 5–15)
BUN: 37 mg/dL — ABNORMAL HIGH (ref 6–20)
CO2: 26 mmol/L (ref 22–32)
Calcium: 9 mg/dL (ref 8.9–10.3)
Chloride: 93 mmol/L — ABNORMAL LOW (ref 98–111)
Creatinine, Ser: 8.72 mg/dL — ABNORMAL HIGH (ref 0.61–1.24)
GFR, Estimated: 7 mL/min — ABNORMAL LOW (ref >60)
Glucose, Bld: 101 mg/dL — ABNORMAL HIGH (ref 70–99)
Phosphorus: 5.2 mg/dL — ABNORMAL HIGH (ref 2.5–4.6)
Potassium: 4.6 mmol/L (ref 3.5–5.1)
Sodium: 131 mmol/L — ABNORMAL LOW (ref 135–145)

## 2021-09-16 LAB — GLUCOSE, CAPILLARY
Glucose-Capillary: 98 mg/dL (ref 70–99)
Glucose-Capillary: 71 mg/dL (ref 70–99)
Glucose-Capillary: 79 mg/dL (ref 70–99)
Glucose-Capillary: 147 mg/dL — ABNORMAL HIGH (ref 70–99)
Glucose-Capillary: 114 mg/dL — ABNORMAL HIGH (ref 70–99)

## 2021-09-16 LAB — CBC
HCT: 22.8 % — ABNORMAL LOW (ref 39.0–52.0)
Hemoglobin: 7.5 g/dL — ABNORMAL LOW (ref 13.0–17.0)
MCH: 30.5 pg (ref 26.0–34.0)
MCHC: 32.9 g/dL (ref 30.0–36.0)
MCV: 92.7 fL (ref 80.0–100.0)
Platelets: 149 10*3/uL — ABNORMAL LOW (ref 150–400)
RBC: 2.46 MIL/uL — ABNORMAL LOW (ref 4.22–5.81)
RDW: 18.9 % — ABNORMAL HIGH (ref 11.5–15.5)
WBC: 6.9 10*3/uL (ref 4.0–10.5)
nRBC: 0.3 % — ABNORMAL HIGH (ref 0.0–0.2)

## 2021-09-16 SURGERY — ESOPHAGOGASTRODUODENOSCOPY (EGD) WITH PROPOFOL
Anesthesia: Monitor Anesthesia Care

## 2021-09-16 MED ORDER — LIDOCAINE 2% (20 MG/ML) 5 ML SYRINGE
INTRAMUSCULAR | Status: DC | PRN
  Administered 2021-09-16: 60 mg via INTRAVENOUS
  Administered 2021-09-16: 40 mg via INTRAVENOUS

## 2021-09-16 MED ORDER — APIXABAN 2.5 MG PO TABS
2.5000 mg | ORAL_TABLET | Freq: Two times a day (BID) | ORAL | Status: DC
  Administered 2021-09-16 – 2021-09-17 (×2): 2.5 mg via ORAL
  Filled 2021-09-16 (×2): qty 1

## 2021-09-16 MED ORDER — PROPOFOL 10 MG/ML IV BOLUS
INTRAVENOUS | Status: DC | PRN
  Administered 2021-09-16: 20 mg via INTRAVENOUS
  Administered 2021-09-16: 30 mg via INTRAVENOUS

## 2021-09-16 MED ORDER — SODIUM CHLORIDE 0.9% IV SOLUTION: Freq: Once | INTRAVENOUS | Status: AC

## 2021-09-16 MED ORDER — PROPOFOL 500 MG/50ML IV EMUL
INTRAVENOUS | Status: DC | PRN
  Administered 2021-09-16: 75 ug/kg/min via INTRAVENOUS

## 2021-09-16 SURGICAL SUPPLY — 15 items

## 2021-09-16 NOTE — Anesthesia Postprocedure Evaluation (Signed)
Anesthesia Post Note  Patient: Shaun Burns  Procedure(s) Performed: ESOPHAGOGASTRODUODENOSCOPY (EGD) WITH PROPOFOL     Patient location during evaluation: PACU Anesthesia Type: MAC Level of consciousness: awake and alert Pain management: pain level controlled Vital Signs Assessment: post-procedure vital signs reviewed and stable Respiratory status: spontaneous breathing Cardiovascular status: stable Anesthetic complications: no   No notable events documented.  Last Vitals:  Vitals:   09/16/21 1046 09/16/21 1103  BP: 103/75 (!) 152/72  Pulse:  72  Resp: 19 17  Temp: 36.5 C 36.8 C  SpO2: 100% 100%    Last Pain:  Vitals:   09/16/21 1216  TempSrc:   PainSc: 0-No pain                 Nolon Nations

## 2021-09-16 NOTE — Progress Notes (Signed)
Shaun Burns  Assessment/ Plan: Pt is a 50 y.o. yo male  with history of hypertension, HLD, DM, CAD status post CABG, ESRD on HD MWF at [facility] presented with chest pain, seen as a consultation for the management of ESRD.   OP HD: [name], MWF, LUE AVG.  #Chest pain/unstable angina: Troponin mildly elevated.  Plan to discontinue IV heparin because of GI bleed.  Cardiology is following, pending echo.  Started on mdur, metoprolol.  Not sure if patient can actually tolerate this medication because of hypotension. -medical mgmt   # ESRD: MWF at [place].  Status post HD on 9/14 for 4 hours, unable to UF because of hypotension despite of lowering dialysate temperature and use of albumin.  Started on midodrine 10 mg 3 times daily.  Next HD tomorrow  #Hypotension/volume: On cardiac medication for angina.  Midodrine started as above.  UF as tolerated.  Patient reports chronic hypotension as outpatient as well.   # Anemia of ESRD, GIB: Iron saturation 40%.  Received a unit of PRBC on 9/14.  Started ESA with HD.  Monitor hemoglobin and transfuse as needed. GI on board, egd 9/18 w/ Erosive gastropathy, no stigmata of bleeding, patient declines c-scope   # Metabolic Bone Disease: Calcium and phosphorus level at goal.  #Hyponatremia -managing with HD  #Vascular access: LUE AVG, with some prolonged bleeding. Likely outflow stenosis, if anticipating a long inpatient stay then can have IR evaluate him for a shuntoogram otherwise can be done as an outpatient  Subjective: . No acute events. S/p egd today: Erosive gastropathy, no stigmata of bleeding. Patient reports that he had very slightl bleeding from his AVF overnight. Apparently, this has been an intermittent issue for a while now. Objective Vital signs in last 24 hours: Vitals:   09/16/21 0450 09/16/21 0817 09/16/21 0923 09/16/21 1020  BP: (!) 129/57 (!) 124/58 (!) 146/50 110/69  Pulse: 77 94 79   Resp:  18   15  Temp: 98.4 F (36.9 C) 98.2 F (36.8 C) (!) 97.5 F (36.4 C) 97.8 F (36.6 C)  TempSrc: Oral Oral Oral   SpO2: 100% 100% 100% 100%  Weight:      Height:       Weight change:   Intake/Output Summary (Last 24 hours) at 09/16/2021 1036 Last data filed at 09/16/2021 1011 Gross per 24 hour  Intake 320 ml  Output 0 ml  Net 320 ml       Labs: Basic Metabolic Panel: Recent Labs  Lab 09/14/21 0140 09/15/21 0259 09/16/21 0411  NA 133* 133* 131*  K 4.5 4.4 4.6  CL 93* 95* 93*  CO2 29 25 26   GLUCOSE 103* 75 101*  BUN 47* 26* 37*  CREATININE 8.88* 6.21* 8.72*  CALCIUM 9.3 8.9 9.0  PHOS 6.1* 4.4 5.2*   Liver Function Tests: Recent Labs  Lab 09/13/21 0433 09/14/21 0140 09/15/21 0259 09/16/21 0411  AST 14*  --   --   --   ALT 18  --   --   --   ALKPHOS 36*  --   --   --   BILITOT 0.6  --   --   --   PROT 6.0*  --   --   --   ALBUMIN 3.5  3.4* 3.3* 3.3* 3.3*   No results for input(s): LIPASE, AMYLASE in the last 168 hours. No results for input(s): AMMONIA in the last 168 hours. CBC: Recent Labs  Lab 09/13/21  7014 09/14/21 0140 09/14/21 1112 09/15/21 0259 09/16/21 0411  WBC 4.8 7.6 7.0 6.5 6.9  HGB 7.1* 7.3* 7.4* 7.7* 7.5*  HCT 22.0* 22.9* 23.0* 22.9* 22.8*  MCV 94.0 92.3 93.5 91.2 92.7  PLT 136* 161 167 169 149*   Cardiac Enzymes: No results for input(s): CKTOTAL, CKMB, CKMBINDEX, TROPONINI in the last 168 hours. CBG: Recent Labs  Lab 09/15/21 1045 09/15/21 1540 09/15/21 2114 09/16/21 0744 09/16/21 1021  GLUCAP 119* 139* 139* 98 71    Iron Studies:  No results for input(s): IRON, TIBC, TRANSFERRIN, FERRITIN in the last 72 hours.  Studies/Results: No results found.  Medications: Infusions:  sodium chloride      Scheduled Medications:  [MAR Hold] amiodarone  200 mg Oral Daily   [MAR Hold] atorvastatin  40 mg Oral QPM   [MAR Hold] Chlorhexidine Gluconate Cloth  6 each Topical Q0600   [MAR Hold] Chlorhexidine Gluconate Cloth  6  each Topical Q0600   [MAR Hold] darbepoetin (ARANESP) injection - DIALYSIS  60 mcg Intravenous Q Fri-HD   influenza vac split quadrivalent PF  0.5 mL Intramuscular Tomorrow-1000   [MAR Hold] insulin aspart  0-6 Units Subcutaneous TID WC   [MAR Hold] isosorbide mononitrate  15 mg Oral Daily   [MAR Hold] metoprolol succinate  50 mg Oral Daily   [MAR Hold] midodrine  10 mg Oral TID WC   [MAR Hold] mupirocin ointment  1 application Nasal BID   [MAR Hold] pantoprazole  40 mg Oral BID AC   [MAR Hold] sevelamer carbonate  800 mg Oral TID WC    have reviewed scheduled and prn medications.  Physical Exam: General:NAD, comfortable Heart:RRR, s1s2 nl Lungs: normal wob Abdomen:soft, Non-tender, non-distended Extremities:No edema Dialysis Access: Left AVG has good thrill and bruit.  [name] 09/16/2021,10:36 AM  LOS: 4 days

## 2021-09-16 NOTE — Anesthesia Procedure Notes (Signed)
Procedure Name: MAC Date/Time: 09/16/2021 9:56 AM Performed by: Trinna Post., CRNA Pre-anesthesia Checklist: Patient identified, Suction available, Patient being monitored, Timeout performed and Emergency Drugs available Patient Re-evaluated:Patient Re-evaluated prior to induction Oxygen Delivery Method: Nasal cannula Preoxygenation: Pre-oxygenation with 100% oxygen Induction Type: IV induction Placement Confirmation: positive ETCO2

## 2021-09-16 NOTE — Progress Notes (Signed)
PROGRESS NOTE   Shaun Burns  EGB:[id number] DOB: July 08, 1971 DOA: 09/12/2021 PCP: Briant Sites, PA-C  Brief Narrative:   50 year old black male permanent A. 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-99% CTO mid LAD-(inability to perform PCI of mid LAD at [facility]), sustained VT + syncope + Biotronik ICD placement 01/2021-EF 40-45% at that time ESRD MWF DM TY 2 HTN Chronic tobacco  Presented from dialysis center 9/14 ongoing chest pain about a week numbness over central anterior chest wall-on presentation to ED hemoglobin down from 11-7.6-patient refused to guaiac--no reported melena hematemesis-prior history of gastric ulcer 06/2017 [facility] follow-up EGD gastric erosions Initial troponin was 98 but EKG showed ST depression in lateral leads  Developed hypotension, troponin bumped to the 600 range concerning for unstable angina, had 1 dark stool Elevated troponins and, ST depressions prompted transfer to [facility] for consideration of cardiac cath-Heparin was stopped by cardiology  Eventually underwent EGD without obvious bleeding source Had further CP and cardiology re-evalauted   Hospital-Problem based course  NSTEMI-prior CAD CABG 2019 inability to perform PCI to mid LAD at [facility] Cardiology recommending medical management, feel NSTEMI secondary to demand ischemia --see below Further recurrence CP 9/18 post procedure--unclear if anginal or missed BB, Nitrate or related to scope  not cath candidate-Heparin discontinued 2/2 risk of bleed/ cardiology Continuing Toprol-XL 50 daily atorvastatin 40 sublingual nitroglycerin Imdur 60 -->15 daily, hydralazine held, Was on aspirin twice daily ??held secondary to bleed--aspiring in additioan to Eliquis deferred to cardiology Transfusing 1 U  PRBC UGIB-EGD 9/18-erosions hemoglobin baseline 11 currently 7.5 Transfusing 1 U prbc for demand ischemia [threshold likely  to be below 8 in patient with CAD, not 7] Ppi BID x 8 wk's, no NSAID and follow h pylori stool AG Anemia NOS Iron studies don't show IDA.  Likely needs Aranesp.  OP work-up for hemolysis if felt needed by PCP, suspect he might have Colonic source which he doesn't want to have colonoscopy for--was on bid ASA and also eliquis ESRD MWF Chronic hypotension-continue midodrine 10 mg 3 times daily Aranesp as per renal Eventually when no angina may need to adjust heart meds based on BP Continue Renvela 800 3 times daily Permanent A. 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 Continue amiodarone 200 daily-Eliquis 5 bid initially held-resume 9/18 post-scope per GI He has some PVCs on the monitor but they are not severe or significant HFrEF on last echo Volume management with dialysis anuric at baseline DM TY 2 Not on PTA meds--CBG ranging 70-100 eating 100% of meals continue sliding-scale coverage Outpatient reeval   DVT prophylaxis: SCD Code Status: Full Family Communication: None present Disposition:  Status is: Inpatient  Remains inpatient appropriate because:Ongoing active pain requiring inpatient pain management and Ongoing diagnostic testing needed not appropriate for outpatient work up  Dispo: The patient is from: Home              Anticipated d/c is to: Home              Patient currently is not medically stable to d/c.   Difficult to place patient No       Consultants:  Gastroenterology Renal Cardiology  Procedures:  EGD 9/18 Impression:               - Normal esophagus.                           -  Erosive gastropathy with no stigmata of recent                            bleeding.                           - Normal examined duodenum.                           - No specimens collected.   Antimicrobials: No   Subjective:  Chest pain this am-responsive to nitro--didn't have food, had scope, didn't recive BB or Imdur either this am To my read EKG was unchanged  with minimal ST-T changes alerted cardiology  Objective: Vitals:   09/16/21 0923 09/16/21 1020 09/16/21 1046 09/16/21 1103  BP: (!) 146/50 110/69 103/75 (!) 152/72  Pulse: 79   72  Resp:  15 19 17   Temp: (!) 97.5 F (36.4 C) 97.8 F (36.6 C) 97.7 F (36.5 C) 98.2 F (36.8 C)  TempSrc: Oral   Oral  SpO2: 100% 100% 100% 100%  Weight:      Height:        Intake/Output Summary (Last 24 hours) at 09/16/2021 1451 Last data filed at 09/16/2021 1011 Gross per 24 hour  Intake 200 ml  Output 0 ml  Net 200 ml    Filed Weights   09/13/21 1909 09/14/21 1355 09/14/21 1815  Weight: 95 kg 96.8 kg 94.6 kg    Examination:  coherent EOMI NCAT no focal deficit CTA B no added sound Slightly thick neck No murmur no rub no gallop-monitor shows some PVCs Abdomen is soft no rebound no guarding no epigastric tenderness no hepatosplenomegaly no distention No lower extremity edema no rash Neurologically intact overall moving 4 limbs without deficit  Psych euthymic congruent pleasant  Data Reviewed: personally reviewed   CBC    Component Value Date/Time   WBC 6.9 09/16/2021 0411   RBC 2.46 (L) 09/16/2021 0411   HGB 7.5 (L) 09/16/2021 0411   HCT 22.8 (L) 09/16/2021 0411   PLT 149 (L) 09/16/2021 0411   MCV 92.7 09/16/2021 0411   MCH 30.5 09/16/2021 0411   MCHC 32.9 09/16/2021 0411   RDW 18.9 (H) 09/16/2021 0411   LYMPHSABS 1.5 01/01/2021 0914   MONOABS 0.6 01/01/2021 0914   EOSABS 0.1 01/01/2021 0914   BASOSABS 0.0 01/01/2021 0914   CMP Latest Ref Rng & Units 09/16/2021 09/15/2021 09/14/2021  Glucose 70 - 99 mg/dL 101(H) 75 103(H)  BUN 6 - 20 mg/dL 37(H) 26(H) 47(H)  Creatinine 0.61 - 1.24 mg/dL 8.72(H) 6.21(H) 8.88(H)  Sodium 135 - 145 mmol/L 131(L) 133(L) 133(L)  Potassium 3.5 - 5.1 mmol/L 4.6 4.4 4.5  Chloride 98 - 111 mmol/L 93(L) 95(L) 93(L)  CO2 22 - 32 mmol/L 26 25 29   Calcium 8.9 - 10.3 mg/dL 9.0 8.9 9.3  Total Protein 6.5 - 8.1 g/dL - - -  Total Bilirubin 0.3 - 1.2 mg/dL  - - -  Alkaline Phos 38 - 126 U/L - - -  AST 15 - 41 U/L - - -  ALT 0 - 44 U/L - - -     Radiology Studies: No results found.   Scheduled Meds:  sodium chloride   Intravenous Once   amiodarone  200 mg Oral Daily   apixaban  5 mg Oral BID   atorvastatin  40 mg Oral QPM  Chlorhexidine Gluconate Cloth  6 each Topical Q0600   Chlorhexidine Gluconate Cloth  6 each Topical Q0600   darbepoetin (ARANESP) injection - DIALYSIS  60 mcg Intravenous Q Fri-HD   influenza vac split quadrivalent PF  0.5 mL Intramuscular Tomorrow-1000   insulin aspart  0-6 Units Subcutaneous TID WC   isosorbide mononitrate  15 mg Oral Daily   metoprolol succinate  50 mg Oral Daily   midodrine  10 mg Oral TID WC   mupirocin ointment  1 application Nasal BID   pantoprazole  40 mg Oral BID AC   sevelamer carbonate  800 mg Oral TID WC   Continuous Infusions:     LOS: 4 days   Time spent: 40  Nita Sells, MD [facility] To contact the attending provider between 7A-7P or the covering provider during after hours 7P-7A, please log into the web site [email or web address] and access using universal [place] password for that web site. If you do not have the password, please call the hospital operator.  09/16/2021, 2:51 PM

## 2021-09-16 NOTE — Anesthesia Preprocedure Evaluation (Addendum)
Anesthesia Evaluation  Patient identified by MRN, date of birth, ID band Patient awake    Reviewed: Allergy & Precautions, NPO status , Patient's Chart, lab work & pertinent test results  Airway Mallampati: II  TM Distance: >3 FB Neck ROM: Full    Dental  (+) Dental Advisory Given, Chipped, Poor Dentition, Missing   Pulmonary Current Smoker and Patient abstained from smoking.,    breath sounds clear to auscultation       Cardiovascular hypertension, Pt. on home beta blockers + CAD, + Past MI and + CABG  + Valvular Problems/Murmurs MR  Rhythm:Regular Rate:Normal  Echo 09/13/2021 1. Left ventricular ejection fraction, by estimation, is 50 to 55%. The left ventricle has low normal function. The left ventricle demonstrates regional wall motion abnormalities (see scoring diagram/findings for description). There is moderate concentric left ventricular hypertrophy. Left ventricular diastolic parameters are indeterminate. Elevated left ventricular end-diastolic pressure.  2. Right ventricular systolic function is normal. The right ventricular size is normal. There is mildly elevated pulmonary artery systolic pressure. The estimated right ventricular systolic pressure is 32.9 mmHg.  3. Left atrial size was severely dilated.  4. The mitral valve is abnormal. Moderate mitral valve regurgitation. Moderate mitral annular calcification.  5. Tricuspid valve regurgitation is moderate.  6. The aortic valve is tricuspid, calcified with restricted motion of the left coronary cusp. Aortic valve regurgitation is not visualized. Mild aortic valve sclerosis is present, with no evidence of aortic valve stenosis. Aortic valve mean gradient measures 6.7 mmHg.  7. The inferior vena cava is dilated in size with >50% respiratory variability, suggesting right atrial pressure of 8 mmHg.    AICD implant 01/2021 Shaun Burns is a 50 y.o. 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 He was found to be COVID-19 nasal PCR positive with clinically mild infection. He had recurrent syncope at [facility], and on telemetry at the time was found to have monomorphic VT rate ~184/minute. He was started on amiodarone. Coronary angiography shows severe, nonrevascularizable 3 vessel coronary disease. He presents for secondary prevention ICD implantation.Given monomorphic VT, and option for ATP therapy, we recommended an endovascular ICD, with right-sided implantation given the presence of left arm AV fistula for IHD.     Neuro/Psych  Headaches,    GI/Hepatic GERD  ,  Endo/Other  diabetes, Well Controlled, Type 2, Oral Hypoglycemic Agents  Renal/GU Renal Insufficiency, CRF and DialysisRenal diseaseHD the last two months via diatek right chest, Shaun Burns     Musculoskeletal   Abdominal   Peds  Hematology  (+) Blood dyscrasia, anemia ,   Anesthesia Other Findings   Reproductive/Obstetrics                           Anesthesia Physical  Anesthesia Plan  ASA: 4  Anesthesia Plan: MAC   Post-op Pain Management:    Induction: Intravenous  PONV Risk Score and Plan: 0 and Propofol infusion, Treatment may vary due to age or medical condition and TIVA  Airway Management Planned: Natural Airway and Simple Face Mask  Additional Equipment: None  Intra-op Plan:   Post-operative Plan:   Informed Consent: I have reviewed the patients History and Physical, chart, labs and  discussed the procedure including the risks, benefits and alternatives for the proposed anesthesia with the patient or authorized representative who has indicated his/her understanding and acceptance.      Dental advisory given  Plan Discussed with: CRNA  Anesthesia Plan Comments:     Anesthesia Quick Evaluation

## 2021-09-16 NOTE — Progress Notes (Signed)
Cardiology Progress Note  Patient ID: Shaun Burns MRN: [id number] DOB: Jun 16, 1971 Date of Encounter: 09/16/2021  Primary Cardiologist: Carlyle Dolly, MD  Subjective   Chief Complaint: Chest pain  HPI: Chest pain this morning after procedure.  Did not receive her medications as he was having EGD done today.  EGD shows evidence of gastritis with no active bleeding.  Hemoglobin remains below 8.  No further chest pain.  Responded to nitroglycerin.  EKG unchanged.  ROS:  All other ROS reviewed and negative. Pertinent positives noted in the HPI.     Inpatient Medications  Scheduled Meds:  sodium chloride   Intravenous Once   amiodarone  200 mg Oral Daily   atorvastatin  40 mg Oral QPM   Chlorhexidine Gluconate Cloth  6 each Topical Q0600   Chlorhexidine Gluconate Cloth  6 each Topical Q0600   darbepoetin (ARANESP) injection - DIALYSIS  60 mcg Intravenous Q Fri-HD   influenza vac split quadrivalent PF  0.5 mL Intramuscular Tomorrow-1000   insulin aspart  0-6 Units Subcutaneous TID WC   isosorbide mononitrate  15 mg Oral Daily   metoprolol succinate  50 mg Oral Daily   midodrine  10 mg Oral TID WC   mupirocin ointment  1 application Nasal BID   pantoprazole  40 mg Oral BID AC   sevelamer carbonate  800 mg Oral TID WC   Continuous Infusions:  PRN Meds: acetaminophen **OR** acetaminophen, albuterol, bisacodyl, fentaNYL (SUBLIMAZE) injection, nicotine, nitroGLYCERIN, ondansetron **OR** ondansetron (ZOFRAN) IV   Vital Signs   Vitals:   09/16/21 0923 09/16/21 1020 09/16/21 1046 09/16/21 1103  BP: (!) 146/50 110/69 103/75 (!) 152/72  Pulse: 79   72  Resp:  15 19 17   Temp: (!) 97.5 F (36.4 C) 97.8 F (36.6 C) 97.7 F (36.5 C) 98.2 F (36.8 C)  TempSrc: Oral   Oral  SpO2: 100% 100% 100% 100%  Weight:      Height:        Intake/Output Summary (Last 24 hours) at 09/16/2021 1358 Last data filed at 09/16/2021 1011 Gross per 24 hour  Intake 200 ml  Output 0 ml  Net 200 ml    Last 3 Weights 09/14/2021 09/14/2021 09/13/2021  Weight (lbs) 208 lb 8.9 oz 213 lb 6.5 oz 209 lb 6.4 oz  Weight (kg) 94.6 kg 96.8 kg 94.983 kg      Telemetry  Overnight telemetry shows A. fib heart rate 80s, which I personally reviewed.   ECG  The most recent ECG shows atrial fibrillation heart rate 92, nonspecific ST-T changes, which I personally reviewed.   Physical Exam   Vitals:   09/16/21 0923 09/16/21 1020 09/16/21 1046 09/16/21 1103  BP: (!) 146/50 110/69 103/75 (!) 152/72  Pulse: 79   72  Resp:  15 19 17   Temp: (!) 97.5 F (36.4 C) 97.8 F (36.6 C) 97.7 F (36.5 C) 98.2 F (36.8 C)  TempSrc: Oral   Oral  SpO2: 100% 100% 100% 100%  Weight:      Height:        Intake/Output Summary (Last 24 hours) at 09/16/2021 1358 Last data filed at 09/16/2021 1011 Gross per 24 hour  Intake 200 ml  Output 0 ml  Net 200 ml    Last 3 Weights 09/14/2021 09/14/2021 09/13/2021  Weight (lbs) 208 lb 8.9 oz 213 lb 6.5 oz 209 lb 6.4 oz  Weight (kg) 94.6 kg 96.8 kg 94.983 kg    Body mass index is 29.92 kg/m.  General:  Well nourished, well developed, in no acute distress Head: Atraumatic, normal size  Eyes: PEERLA, EOMI  Neck: Supple, no JVD Endocrine: No thryomegaly Cardiac: Normal S1, S2; irregular rhythm, 2 out of 6 systolic ejection murmur Lungs: Clear to auscultation bilaterally, no wheezing, rhonchi or rales  Abd: Soft, nontender, no hepatomegaly  Ext: No edema, pulses 2+ Musculoskeletal: No deformities, BUE and BLE strength normal and equal Skin: Warm and dry, no rashes   Neuro: Alert and oriented to person, place, time, and situation, CNII-XII grossly intact, no focal deficits  Psych: Normal mood and affect   Labs  High Sensitivity Troponin:   Recent Labs  Lab 09/12/21 1912 09/12/21 2054 09/12/21 2318 09/13/21 0211 09/13/21 0433  TROPONINIHS 458* 537* 513* 512* 600*     Cardiac EnzymesNo results for input(s): TROPONINI in the last 168 hours. No results for input(s):  TROPIPOC in the last 168 hours.  Chemistry Recent Labs  Lab 09/13/21 0433 09/14/21 0140 09/15/21 0259 09/16/21 0411  NA 134* 133* 133* 131*  K 3.8 4.5 4.4 4.6  CL 95* 93* 95* 93*  CO2 30 29 25 26   GLUCOSE 90 103* 75 101*  BUN 34* 47* 26* 37*  CREATININE 6.90* 8.88* 6.21* 8.72*  CALCIUM 8.2* 9.3 8.9 9.0  PROT 6.0*  --   --   --   ALBUMIN 3.5  3.4* 3.3* 3.3* 3.3*  AST 14*  --   --   --   ALT 18  --   --   --   ALKPHOS 36*  --   --   --   BILITOT 0.6  --   --   --   GFRNONAA 9* 7* 10* 7*  ANIONGAP 9 11 13 12     Hematology Recent Labs  Lab 09/14/21 1112 09/15/21 0259 09/16/21 0411  WBC 7.0 6.5 6.9  RBC 2.46* 2.51* 2.46*  HGB 7.4* 7.7* 7.5*  HCT 23.0* 22.9* 22.8*  MCV 93.5 91.2 92.7  MCH 30.1 30.7 30.5  MCHC 32.2 33.6 32.9  RDW 19.0* 18.8* 18.9*  PLT 167 169 149*   BNPNo results for input(s): BNP, PROBNP in the last 168 hours.  DDimer No results for input(s): DDIMER in the last 168 hours.   Radiology  No results found.  Cardiac Studies  TTE 09/13/2021  1. Left ventricular ejection fraction, by estimation, is 50 to 55%. The  left ventricle has low normal function. The left ventricle demonstrates  regional wall motion abnormalities (see scoring diagram/findings for  description). There is moderate  concentric left ventricular hypertrophy. Left ventricular diastolic  parameters are indeterminate. Elevated left ventricular end-diastolic  pressure.   2. Right ventricular systolic function is normal. The right ventricular  size is normal. There is mildly elevated pulmonary artery systolic  pressure. The estimated right ventricular systolic pressure is 78.4 mmHg.   3. Left atrial size was severely dilated.   4. The mitral valve is abnormal. Moderate mitral valve regurgitation.  Moderate mitral annular calcification.   5. Tricuspid valve regurgitation is moderate.   6. The aortic valve is tricuspid, calcified with restricted motion of the  left coronary cusp. Aortic  valve regurgitation is not visualized. Mild  aortic valve sclerosis is present, with no evidence of aortic valve  stenosis. Aortic valve mean gradient  measures 6.7 mmHg.   7. The inferior vena cava is dilated in size with >50% respiratory  variability, suggesting right atrial pressure of 8 mmHg.   Patient Profile  50 year old male with history of persistent  atrial fibrillation on Eliquis, ESRD on hemodialysis, hypertension, 2 vessel CAD status post CABG (LIMA to LAD, vein graft to RCA), sustained ventricular tachycardia status post ICD, GI bleed who was admitted on 09/12/2021 with chest pain in the setting of worsening anemia.  Assessment & Plan   #Non-STEMI #Demand ischemia in setting of anemia #Three-vessel CAD -Chest pain episode this morning likely related to not receiving medications as he underwent EGD.  EKG unchanged.  Would recommend to give him his home medications.  His pain resolved with nitroglycerin. -He was admitted for newfound anemia.  Hemoglobin is dropped from 11-7.6.  He had troponins that were elevated but this was in the setting of new anemia. -He has known three-vessel CAD with an occluded vein graft to the RCA.  Also has a severe lesion in the mid LAD after the anastomosis of the LIMA to LAD.  This was not intervened upon due to technical difficulties at [facility] in February.  Overall I feel his presentation of unstable angina is related to demand.  Invasive angiography has not been recommended due to anemia and no real targets for revascularization on recent heart cath. -Echo actually shows EF has improved.  No regional wall motion abnormalities. -We will continue home beta-blocker and Imdur. -EGD shows gastritis. -I am going to transfuse him 1 unit of packed red blood cells.  Would like his hemoglobin above 8.  Likely will feel better. -As long as hemoglobin remained stable we will add back aspirin tomorrow.  #Ischemic cardiomyopathy, 50-55% -EF has improved.  Up to  55%. -Continue metoprolol succinate.  -Hypotension with dialysis has precluded aggressive guideline directed medical therapy.  He also has ESRD and cannot be on an ACE/ARB/Arni/MRA.  No SGLT2 inhibitor. -He is well compensated and volume status is managed by dialysis.  #VT status post ICD -No issues  #Persistent atrial fibrillation -Hemoglobin stable.  Transfusing 1 unit.  Gastritis seen on EGD.  Plan to start Eliquis tomorrow as long as hemoglobin is okay.  #Anemia -Hemoglobin stable. -Transfusing 1 unit today. -Gastritis with no active bleeding and EGD.  Declines colonoscopy. -Plan to restart aspirin and Eliquis tomorrow as long as hemoglobin is stable.  For questions or updates, please contact [place] Please consult [email or web address] for contact info under   Time Spent with Patient: I have spent a total of 25 minutes with patient reviewing hospital notes, telemetry, EKGs, labs and examining the patient as well as establishing an assessment and plan that was discussed with the patient.  > 50% of time was spent in direct patient care.    Signed, Addison Naegeli. Audie Box, MD, [place]  09/16/2021 1:58 PM

## 2021-09-16 NOTE — Transfer of Care (Signed)
Immediate Anesthesia Transfer of Care Note  Patient: Shaun Burns  Procedure(s) Performed: ESOPHAGOGASTRODUODENOSCOPY (EGD) WITH PROPOFOL  Patient Location: PACU  Anesthesia Type:MAC  Level of Consciousness: awake, alert  and oriented  Airway & Oxygen Therapy: Patient Spontanous Breathing and Patient connected to nasal cannula oxygen  Post-op Assessment: Report given to RN and Post -op Vital signs reviewed and stable  Post vital signs: Reviewed and stable  Last Vitals:  Vitals Value Taken Time  BP 118/50 09/16/21 1035  Temp 36.6 C 09/16/21 1020  Pulse 82 09/16/21 1039  Resp 22 09/16/21 1041  SpO2 100 % 09/16/21 1039  Vitals shown include unvalidated device data.  Last Pain:  Vitals:   09/16/21 1020  TempSrc:   PainSc: 0-No pain         Complications: No notable events documented.

## 2021-09-16 NOTE — Interval H&P Note (Signed)
History and Physical Interval Note:  09/16/2021 9:48 AM  Shaun Burns  has presented today for surgery, with the diagnosis of Anemia, Melena.  The various methods of treatment have been discussed with the patient and family. After consideration of risks, benefits and other options for treatment, the patient has consented to  Procedure(s): ESOPHAGOGASTRODUODENOSCOPY (EGD) WITH PROPOFOL (N/A) as a surgical intervention.  The patient's history has been reviewed, patient examined, no change in status, stable for surgery.  I have reviewed the patient's chart and labs.  Questions were answered to the patient's satisfaction.     Sharyn Creamer

## 2021-09-16 NOTE — Op Note (Signed)
[facility] Patient Name: Shaun Burns Procedure Date : 09/16/2021 MRN: [id number] Attending MD: Georgian Co ,  Date of Birth: 1971/12/01 CSN: [id number] Age: 50 Admit Type: Inpatient Procedure:                Upper GI endoscopy Indications:              Anemia Providers:                Adline Mango" Carmin Richmond, RN,                            Cherylynn Ridges, Technician, Dewitt Hoes, CRNA Referring MD:             Hospitalist group Medicines:                Monitored Anesthesia Care Complications:            No immediate complications. Estimated Blood Loss:     Estimated blood loss: none. Procedure:                Pre-Anesthesia Assessment:                           - Prior to the procedure, a History and Physical                            was performed, and patient medications and                            allergies were reviewed. The patient is competent.                            The risks and benefits of the procedure and the                            sedation options and risks were discussed with the                            patient. All questions were answered and informed                            consent was obtained. Patient identification and                            proposed procedure were verified by the physician                            in the pre-procedure area. Mental Status                            Examination: normal. Prophylactic Antibiotics: The                            patient does not require prophylactic antibiotics.                            Prior  Anticoagulants: The patient has taken Eliquis                            (apixaban), last dose was 2 days prior to                            procedure. ASA Grade Assessment: III - A patient                            with severe systemic disease. After reviewing the                            risks and benefits, the patient was deemed in                             satisfactory condition to undergo the procedure.                            The anesthesia plan was to use monitored anesthesia                            care (MAC). Immediately prior to administration of                            medications, the patient was re-assessed for                            adequacy to receive sedatives. The heart rate,                            respiratory rate, oxygen saturations, blood                            pressure, adequacy of pulmonary ventilation, and                            response to care were monitored throughout the                            procedure. The physical status of the patient was                            re-assessed after the procedure.                           After obtaining informed consent, the endoscope was                            passed under direct vision. Throughout the                            procedure, the patient's blood pressure, pulse, and  oxygen saturations were monitored continuously. The                            [id number]-[id number] ([id number]) Olympus endoscope was introduced                            through the mouth, and advanced to the second part                            of duodenum. Scope In: Scope Out: Findings:      The examined esophagus was normal.      A few localized erosions with no stigmata of recent bleeding were found       in the gastric antrum.      The examined duodenum was normal. Impression:               - Normal esophagus.                           - Erosive gastropathy with no stigmata of recent                            bleeding.                           - Normal examined duodenum.                           - No specimens collected. Recommendation:           - Return patient to hospital ward for ongoing care.                           - Patient's anemia could have had contribution from                            gastric erosions, but I would also  perform a full                            anemia work up to look for other etiologies since                            his iron studies are not consistent with iron                            deficiency anemia. Patient has previously declined                            colonoscopy for further work up.                           - Check H pylori stool antigen                           - PPI BID for 8 weeks                           -  Avoid NSAIDs                           - The findings and recommendations were discussed                            with the patient. Procedure Code(s):        --- Professional ---                           [id number], Esophagogastroduodenoscopy, flexible,                            transoral; diagnostic, including collection of                            specimen(s) by brushing or washing, when performed                            (separate procedure) Diagnosis Code(s):        --- Professional ---                           K31.89, Other diseases of stomach and duodenum                           D64.9, Anemia, unspecified CPT copyright 2019 American Medical Association. All rights reserved. The codes documented in this report are preliminary and upon coder review may  be revised to meet current compliance requirements. Shaun Burns "[name],  09/16/2021 10:27:07 AM Number of Addenda: 0

## 2021-09-17 DIAGNOSIS — Z951 Presence of aortocoronary bypass graft: Secondary | ICD-10-CM

## 2021-09-17 DIAGNOSIS — N186 End stage renal disease: Secondary | ICD-10-CM | POA: Diagnosis not present

## 2021-09-17 DIAGNOSIS — I482 Chronic atrial fibrillation, unspecified: Secondary | ICD-10-CM

## 2021-09-17 DIAGNOSIS — I214 Non-ST elevation (NSTEMI) myocardial infarction: Secondary | ICD-10-CM | POA: Diagnosis not present

## 2021-09-17 DIAGNOSIS — R072 Precordial pain: Secondary | ICD-10-CM | POA: Diagnosis not present

## 2021-09-17 LAB — CBC
HCT: 28 % — ABNORMAL LOW (ref 39.0–52.0)
Hemoglobin: 9.1 g/dL — ABNORMAL LOW (ref 13.0–17.0)
MCH: 29.7 pg (ref 26.0–34.0)
MCHC: 32.5 g/dL (ref 30.0–36.0)
MCV: 91.5 fL (ref 80.0–100.0)
Platelets: 152 10*3/uL (ref 150–400)
RBC: 3.06 MIL/uL — ABNORMAL LOW (ref 4.22–5.81)
RDW: 18.6 % — ABNORMAL HIGH (ref 11.5–15.5)
WBC: 7.3 10*3/uL (ref 4.0–10.5)
nRBC: 0 % (ref 0.0–0.2)

## 2021-09-17 LAB — GLUCOSE, CAPILLARY
Glucose-Capillary: 86 mg/dL (ref 70–99)
Glucose-Capillary: 72 mg/dL (ref 70–99)
Glucose-Capillary: 121 mg/dL — ABNORMAL HIGH (ref 70–99)
Glucose-Capillary: 152 mg/dL — ABNORMAL HIGH (ref 70–99)

## 2021-09-17 LAB — RENAL FUNCTION PANEL
Albumin: 3.5 g/dL (ref 3.5–5.0)
Anion gap: 15 (ref 5–15)
BUN: 47 mg/dL — ABNORMAL HIGH (ref 6–20)
CO2: 24 mmol/L (ref 22–32)
Calcium: 9.2 mg/dL (ref 8.9–10.3)
Chloride: 91 mmol/L — ABNORMAL LOW (ref 98–111)
Creatinine, Ser: 10.55 mg/dL — ABNORMAL HIGH (ref 0.61–1.24)
GFR, Estimated: 5 mL/min — ABNORMAL LOW (ref >60)
Glucose, Bld: 107 mg/dL — ABNORMAL HIGH (ref 70–99)
Phosphorus: 5.8 mg/dL — ABNORMAL HIGH (ref 2.5–4.6)
Potassium: 5.1 mmol/L (ref 3.5–5.1)
Sodium: 130 mmol/L — ABNORMAL LOW (ref 135–145)

## 2021-09-17 LAB — TYPE AND SCREEN
ABO/RH(D): AB POS
Antibody Screen: NEGATIVE
Unit division: 0

## 2021-09-17 LAB — BPAM RBC
Blood Product Expiration Date: 202210112359
ISSUE DATE / TIME: 202209181807
Unit Type and Rh: 8400

## 2021-09-17 MED ORDER — ASPIRIN 81 MG PO CHEW
81.0000 mg | CHEWABLE_TABLET | Freq: Every day | ORAL | Status: DC
  Administered 2021-09-17 – 2021-09-18 (×2): 81 mg via ORAL
  Filled 2021-09-17 (×2): qty 1

## 2021-09-17 MED ORDER — APIXABAN 5 MG PO TABS
5.0000 mg | ORAL_TABLET | Freq: Two times a day (BID) | ORAL | Status: DC
  Administered 2021-09-17 – 2021-09-18 (×2): 5 mg via ORAL
  Filled 2021-09-17 (×2): qty 1

## 2021-09-17 NOTE — Progress Notes (Signed)
Shaun Burns ROUNDING NOTE   Subjective:   Interval History: This 50 year old history of hypertension hyperlipidemia diabetes mellitus end-stage renal disease hemodialysis dependent Monday Wednesday Friday at [facility].  Presented with chest pain evaluated by cardiology.  Is currently receiving dialysis 09/17/2021.  His last dialysis was 09/14/2021 with 2 L removed.  Blood pressure 154/77 pulse 64 temperature 98.8 O2 sats 100% room air  Sodium 130 potassium 5.1 chloride 91 CO2 24 BUN 47 creatinine 10 glucose 107 calcium 9.2 hemoglobin 9.1.  Objective:  Vital signs in last 24 hours:  Temp:  [97.5 F (36.4 C)-98.8 F (37.1 C)] 98.8 F (37.1 C) (09/19 0653) Pulse Rate:  [66-94] 80 (09/19 0653) Resp:  [15-19] 18 (09/19 0653) BP: (103-161)/(50-89) 142/66 (09/19 0653) SpO2:  [100 %] 100 % (09/19 0653)  Weight change:  Filed Weights   09/13/21 1909 09/14/21 1355 09/14/21 1815  Weight: 95 kg 96.8 kg 94.6 kg    Intake/Output: I/O last 3 completed shifts: In: 816 [P.O.:240; I.V.:200; Blood:376] Out: 0    Intake/Output this shift:  No intake/output data recorded.  General:NAD, comfortable Heart:RRR, s1s2 nl Lungs: normal wob Abdomen:soft, Non-tender, non-distended Extremities:No edema Dialysis Access: Left AVG has good thrill and bruit.   Basic Metabolic Panel: Recent Labs  Lab 09/13/21 0433 09/14/21 0140 09/15/21 0259 09/16/21 0411 09/17/21 0219  NA 134* 133* 133* 131* 130*  K 3.8 4.5 4.4 4.6 5.1  CL 95* 93* 95* 93* 91*  CO2 30 29 25 26 24   GLUCOSE 90 103* 75 101* 107*  BUN 34* 47* 26* 37* 47*  CREATININE 6.90* 8.88* 6.21* 8.72* 10.55*  CALCIUM 8.2* 9.3 8.9 9.0 9.2  MG 1.8  --   --   --   --   PHOS 4.7* 6.1* 4.4 5.2* 5.8*    Liver Function Tests: Recent Labs  Lab 09/13/21 0433 09/14/21 0140 09/15/21 0259 09/16/21 0411 09/17/21 0219  AST 14*  --   --   --   --   ALT 18  --   --   --   --   ALKPHOS 36*  --   --   --   --   BILITOT 0.6  --    --   --   --   PROT 6.0*  --   --   --   --   ALBUMIN 3.5  3.4* 3.3* 3.3* 3.3* 3.5   No results for input(s): LIPASE, AMYLASE in the last 168 hours. No results for input(s): AMMONIA in the last 168 hours.  CBC: Recent Labs  Lab 09/14/21 0140 09/14/21 1112 09/15/21 0259 09/16/21 0411 09/17/21 0219  WBC 7.6 7.0 6.5 6.9 7.3  HGB 7.3* 7.4* 7.7* 7.5* 9.1*  HCT 22.9* 23.0* 22.9* 22.8* 28.0*  MCV 92.3 93.5 91.2 92.7 91.5  PLT 161 167 169 149* 152    Cardiac Enzymes: No results for input(s): CKTOTAL, CKMB, CKMBINDEX, TROPONINI in the last 168 hours.  BNP: Invalid input(s): POCBNP  CBG: Recent Labs  Lab 09/16/21 1021 09/16/21 1137 09/16/21 1643 09/16/21 2137 09/17/21 0645  GLUCAP 71 79 147* 114* 86    Microbiology: Results for orders placed or performed during the hospital encounter of 09/12/21  Resp Panel by RT-PCR (Flu A&B, Covid) Nasopharyngeal Swab     Status: None   Collection Time: 09/12/21 12:30 PM   Specimen: Nasopharyngeal Swab; Nasopharyngeal(NP) swabs in vial transport medium  Result Value Ref Range Status   SARS Coronavirus 2 by RT PCR NEGATIVE NEGATIVE Final  Comment: (NOTE) SARS-CoV-2 target nucleic acids are NOT DETECTED.  The SARS-CoV-2 RNA is generally detectable in upper respiratory specimens during the acute phase of infection. The lowest concentration of SARS-CoV-2 viral copies this assay can detect is 138 copies/mL. A negative result does not preclude SARS-Cov-2 infection and should not be used as the sole basis for treatment or other patient management decisions. A negative result may occur with  improper specimen collection/handling, submission of specimen other than nasopharyngeal swab, presence of viral mutation(s) within the areas targeted by this assay, and inadequate number of viral copies(<138 copies/mL). A negative result must be combined with clinical observations, patient history, and epidemiological information. The expected  result is Negative.  Fact Sheet for Patients:  EntrepreneurPulse.com.au  Fact Sheet for Healthcare Providers:  IncredibleEmployment.be  This test is no t yet approved or cleared by the [place] FDA and  has been authorized for detection and/or diagnosis of SARS-CoV-2 by FDA under an Emergency Use Authorization (EUA). This EUA will remain  in effect (meaning this test can be used) for the duration of the COVID-19 declaration under Section 564(b)(1) of the Act, 21 U.S.C.section 360bbb-3(b)(1), unless the authorization is terminated  or revoked sooner.       Influenza A by PCR NEGATIVE NEGATIVE Final   Influenza B by PCR NEGATIVE NEGATIVE Final    Comment: (NOTE) The Xpert Xpress SARS-CoV-2/FLU/RSV plus assay is intended as an aid in the diagnosis of influenza from Nasopharyngeal swab specimens and should not be used as a sole basis for treatment. Nasal washings and aspirates are unacceptable for Xpert Xpress SARS-CoV-2/FLU/RSV testing.  Fact Sheet for Patients: EntrepreneurPulse.com.au  Fact Sheet for Healthcare Providers: IncredibleEmployment.be  This test is not yet approved or cleared by the [place] FDA and has been authorized for detection and/or diagnosis of SARS-CoV-2 by FDA under an Emergency Use Authorization (EUA). This EUA will remain in effect (meaning this test can be used) for the duration of the COVID-19 declaration under Section 564(b)(1) of the Act, 21 U.S.C. section 360bbb-3(b)(1), unless the authorization is terminated or revoked.  Performed at [facility], [address], [place], [place] [address]   MRSA Next Gen by PCR, Nasal     Status: Abnormal   Collection Time: 09/12/21 11:00 PM   Specimen: Nasal Mucosa; Nasal Swab  Result Value Ref Range Status   MRSA by PCR Next Gen DETECTED (A) NOT DETECTED Final    Comment: RESULT CALLED TO, READ BACK BY AND VERIFIED  WITH: LOOMIS,K AT 1007 ON 9.15.22 BY RUCINSKI,B (NOTE) The GeneXpert MRSA Assay (FDA approved for NASAL specimens only), is one component of a comprehensive MRSA colonization surveillance program. It is not intended to diagnose MRSA infection nor to guide or monitor treatment for MRSA infections. Test performance is not FDA approved in patients less than 75 years old. Performed at [facility], [address] [place], [place] [address]     Coagulation Studies: No results for input(s): LABPROT, INR in the last 72 hours.  Urinalysis: No results for input(s): COLORURINE, LABSPEC, PHURINE, GLUCOSEU, HGBUR, BILIRUBINUR, KETONESUR, PROTEINUR, UROBILINOGEN, NITRITE, LEUKOCYTESUR in the last 72 hours.  Invalid input(s): APPERANCEUR    Imaging: No results found.   Medications:     amiodarone  200 mg Oral Daily   apixaban  2.5 mg Oral BID   atorvastatin  40 mg Oral QPM   Chlorhexidine Gluconate Cloth  6 each Topical Q0600   Chlorhexidine Gluconate Cloth  6 each Topical Q0600   darbepoetin (ARANESP)  injection - DIALYSIS  60 mcg Intravenous Q Fri-HD   influenza vac split quadrivalent PF  0.5 mL Intramuscular Tomorrow-1000   insulin aspart  0-6 Units Subcutaneous TID WC   isosorbide mononitrate  15 mg Oral Daily   metoprolol succinate  50 mg Oral Daily   midodrine  10 mg Oral TID WC   mupirocin ointment  1 application Nasal BID   pantoprazole  40 mg Oral BID AC   sevelamer carbonate  800 mg Oral TID WC   acetaminophen **OR** acetaminophen, albuterol, bisacodyl, fentaNYL (SUBLIMAZE) injection, nicotine, nitroGLYCERIN, ondansetron **OR** ondansetron (ZOFRAN) IV  Assessment/ Plan:  OP HD: [name], MWF, LUE AVG.   #Chest pain/unstable angina: Troponin mildly elevated.  Plan to discontinue IV heparin because of GI bleed.  Cardiology is following, pending echo.  Started on mdur, metoprolol.  Not sure if patient can actually tolerate this medication because of hypotension. -medical  mgmt   # ESRD: MWF at [place].  Status post HD on 9/14 for 4 hours, unable to UF because of hypotension despite of lowering dialysate temperature and use of albumin.  Started on midodrine 10 mg 3 times daily.  Patient receiving dialysis 09/17/2021   #Hypotension/volume: On cardiac medication for angina.  Midodrine started as above.  UF as tolerated.  Patient reports chronic hypotension as outpatient as well.   # Anemia of ESRD, GIB: Iron saturation 40%.  Received a unit of PRBC on 9/14.  Started ESA with HD.  Monitor hemoglobin and transfuse as needed. GI on board, egd 9/18 w/ Erosive gastropathy, no stigmata of bleeding, patient declines c-scope   # Metabolic Bone Disease: Calcium and phosphorus level at goal.   #Hyponatremia -managing with HD   #Vascular access: LUE AVG, with some prolonged bleeding. Likely outflow stenosis, if anticipating a long inpatient stay then can have IR evaluate him for a shuntoogram otherwise can be done as an outpatient   LOS: [place] @TODAY @7 :49 AM

## 2021-09-17 NOTE — Progress Notes (Signed)
Progress Note  Patient Name: Shaun Burns Date of Encounter: 09/17/2021  [facility] Cardiologist: Carlyle Dolly, MD   Subjective   No acute overnight events. No recurrent chest pain since episode yesterday morning. No shortness of breath. Received 1 unit of PRBCs yesterday and hemoglobin improved to 9.1 today.  Inpatient Medications    Scheduled Meds:  amiodarone  200 mg Oral Daily   apixaban  2.5 mg Oral BID   atorvastatin  40 mg Oral QPM   Chlorhexidine Gluconate Cloth  6 each Topical Q0600   Chlorhexidine Gluconate Cloth  6 each Topical Q0600   darbepoetin (ARANESP) injection - DIALYSIS  60 mcg Intravenous Q Fri-HD   influenza vac split quadrivalent PF  0.5 mL Intramuscular Tomorrow-1000   insulin aspart  0-6 Units Subcutaneous TID WC   isosorbide mononitrate  15 mg Oral Daily   metoprolol succinate  50 mg Oral Daily   midodrine  10 mg Oral TID WC   mupirocin ointment  1 application Nasal BID   pantoprazole  40 mg Oral BID AC   sevelamer carbonate  800 mg Oral TID WC   Continuous Infusions:  PRN Meds: acetaminophen **OR** acetaminophen, albuterol, bisacodyl, fentaNYL (SUBLIMAZE) injection, nicotine, nitroGLYCERIN, ondansetron **OR** ondansetron (ZOFRAN) IV   Vital Signs    Vitals:   09/17/21 0743 09/17/21 0800 09/17/21 0830 09/17/21 0900  BP: (!) 154/77 107/63 114/73 114/83  Pulse:      Resp: 18     Temp:      TempSrc:      SpO2:      Weight:      Height:        Intake/Output Summary (Last 24 hours) at 09/17/2021 0916 Last data filed at 09/16/2021 2125 Gross per 24 hour  Intake 816 ml  Output 0 ml  Net 816 ml   Last 3 Weights 09/17/2021 09/14/2021 09/14/2021  Weight (lbs) 212 lb 11.9 oz 208 lb 8.9 oz 213 lb 6.5 oz  Weight (kg) 96.5 kg 94.6 kg 96.8 kg      Telemetry    Atrial fibrillation with rates in the 70s to 80s. PVCs noted but no VT.- Personally Reviewed  ECG    No new ECG tracing today. - Personally Reviewed  Physical Exam   GEN: No  acute distress.   Neck: No JVD. Cardiac: Irregular rhythm due to PVC on telemetry. II/VI systolic murmur. No rubs or gallops.  Respiratory: Clear to auscultation bilaterally. No wheezes, rhonchi, or rales. GI: Soft, non-distended, and non-tender. MS: No lower extremity edema. No deformity. Skin: Warm and dry. Neuro:  No focal deficits. Psych: Normal affect. Responds appropriately   Labs    High Sensitivity Troponin:   Recent Labs  Lab 09/12/21 1912 09/12/21 2054 09/12/21 2318 09/13/21 0211 09/13/21 0433  TROPONINIHS 458* 537* 513* 512* 600*     Chemistry Recent Labs  Lab 09/13/21 8101 09/14/21 0140 09/15/21 0259 09/16/21 0411 09/17/21 0219  NA 134*   < > 133* 131* 130*  K 3.8   < > 4.4 4.6 5.1  CL 95*   < > 95* 93* 91*  CO2 30   < > 25 26 24   GLUCOSE 90   < > 75 101* 107*  BUN 34*   < > 26* 37* 47*  CREATININE 6.90*   < > 6.21* 8.72* 10.55*  CALCIUM 8.2*   < > 8.9 9.0 9.2  MG 1.8  --   --   --   --   PROT  6.0*  --   --   --   --   ALBUMIN 3.5  3.4*   < > 3.3* 3.3* 3.5  AST 14*  --   --   --   --   ALT 18  --   --   --   --   ALKPHOS 36*  --   --   --   --   BILITOT 0.6  --   --   --   --   GFRNONAA 9*   < > 10* 7* 5*  ANIONGAP 9   < > 13 12 15    < > = values in this interval not displayed.    Lipids  Recent Labs  Lab 09/13/21 0433  CHOL 89  TRIG 21  HDL 39*  LDLCALC 46  CHOLHDL 2.3    Hematology Recent Labs  Lab 09/15/21 0259 09/16/21 0411 09/17/21 0219  WBC 6.5 6.9 7.3  RBC 2.51* 2.46* 3.06*  HGB 7.7* 7.5* 9.1*  HCT 22.9* 22.8* 28.0*  MCV 91.2 92.7 91.5  MCH 30.7 30.5 29.7  MCHC 33.6 32.9 32.5  RDW 18.8* 18.9* 18.6*  PLT 169 149* 152   Thyroid No results for input(s): TSH, FREET4 in the last 168 hours.  BNPNo results for input(s): BNP, PROBNP in the last 168 hours.  DDimer No results for input(s): DDIMER in the last 168 hours.   Radiology    No results found.  Cardiac Studies   Echocardiogram 09/13/2021: Impressions: 1. Left  ventricular ejection fraction, by estimation, is 50 to 55%. The  left ventricle has low normal function. The left ventricle demonstrates  regional wall motion abnormalities (see scoring diagram/findings for  description). There is moderate  concentric left ventricular hypertrophy. Left ventricular diastolic  parameters are indeterminate. Elevated left ventricular end-diastolic  pressure.   2. Right ventricular systolic function is normal. The right ventricular  size is normal. There is mildly elevated pulmonary artery systolic  pressure. The estimated right ventricular systolic pressure is 06.3 mmHg.   3. Left atrial size was severely dilated.   4. The mitral valve is abnormal. Moderate mitral valve regurgitation.  Moderate mitral annular calcification.   5. Tricuspid valve regurgitation is moderate.   6. The aortic valve is tricuspid, calcified with restricted motion of the  left coronary cusp. Aortic valve regurgitation is not visualized. Mild  aortic valve sclerosis is present, with no evidence of aortic valve  stenosis. Aortic valve mean gradient  measures 6.7 mmHg.   7. The inferior vena cava is dilated in size with >50% respiratory  variability, suggesting right atrial pressure of 8 mmHg.   Comparison(s): Prior images reviewed side by side. LVEF similar range.   LV Wall Scoring:  The posterior wall and basal inferior segment are hypokinetic. The entire  anterior wall, antero-lateral wall, entire septum, entire apex, and mid  and distal inferior wall are normal.   Patient Profile     50 y.o. 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  Assessment & Plan    Chest  Pain Demand Ischemia History of CAD s/p CABG - Patient presented with chest pain in the setting of acute anemia. He has known  CAD s/p CABG x2 in 2019. Last cath in 01/2021 showed occluded SVG to RCA and severe mid LAD lesion after anastomosis of the LIMA to LAD which was not intervened upon due to technical difficulties at [facility]. - High-sensitivity troponin 120 >> 326 >> 458 >> 537 >> 513 >> 512 >> 600. - Echo showed LVEF of 50-55% with multiple wall motion abnormalities as described above. - Felt to be due to demand ischemia in the setting of anemia. EGD showed gastritis. He was transfused 1 unit of PRBCs yesterday. - Continue Imdur 15mg  daily and Toprol-XL 50mg  daily. - Continue Lipitor 40mg  daily. - Will ask MD about restarting Aspirin.  Ischemic Cardiomyopathy - Echo showed EF has improved to 50-55%.  - Volume status managed via hemodialysis. - Continue Toprol-XL 50mg  daily. - No ACE/ARB/ARNI or MRA due to ESRD.  History of Sustained VT s/p ICD - PVC noted on telemetry but no VT. - Continue Amiodarone 200mg  daily and Toprol-XL 50mg  daily.  Persistent Atrial Fibrillation - Rates controlled. - Continue Amiodarone 200mg  daily and Toprol-XL 50mg  daily. - Home Eliquis was held on admission due to anemia. EGD showed gastritis. Received 1 unit of PRBCs yesterday. Hemoglobin much better today. Eliquis 2.5mg  twice daily restarted last night. Will discuss dosing with MD - patient currently on reduced dose but only criteria for this is his renal function (does not meet criteria for age or weight).  Acute on Chronic Anemia - Hemoglobin 7.6 on admission and dropped as low as 7.1 on 09/13/2021.  - S/p 1 unit of PRBC on 9/14 and another on 9/18 due to hemoglobin remaining below 8. Hemoglobin 9.1 today. - EGD on 9/18 showed erosive gastropathy with no stigmata of recent bleeding. Patient declined colonoscopy. - Management per GI and primary team.  ESRD on Hemodialysis - Management per  Nephrology.  Hypotension - Patient reported chronic hypotension as an outpatient. - Nephrology started Midodrine 10mg  three times daily.  Otherwise, per primary team.  For questions or updates, please contact [place] Please consult [email or web address] for contact info under        Signed, Darreld Mclean, PA-C  09/17/2021, 9:16 AM

## 2021-09-17 NOTE — Progress Notes (Signed)
PROGRESS NOTE   Shaun Burns  NFA:[id number] DOB: May 02, 1971 DOA: 09/12/2021 PCP: Briant Sites, PA-C  Brief Narrative:   50 year old black male permanent A. 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-99% CTO mid LAD-(inability to perform PCI of mid LAD at [facility]), sustained VT + syncope + Biotronik ICD placement 01/2021-EF 40-45% at that time ESRD MWF DM TY 2 HTN Chronic tobacco  Presented from dialysis center 9/14 ongoing chest pain about a week numbness over central anterior chest wall-on presentation to ED hemoglobin down from 11-7.6-patient refused to guaiac--no reported melena hematemesis-prior history of gastric ulcer 06/2017 [facility] follow-up EGD gastric erosions Initial troponin was 98 but EKG showed ST depression in lateral leads  Developed hypotension, troponin bumped to the 600 range concerning for unstable angina, had 1 dark stool Elevated troponins and, ST depressions prompted [facility] for consideration of cardiac cath-Heparin was stopped by cardiology  Eventually underwent EGD without obvious bleeding source Had further CP and cardiology re-evaluated felt not to be a candidate for further work-up or intervention by cardiology   Hospital-Problem based course  NSTEMI-prior CAD CABG 2019 inability to perform PCI to mid LAD at [facility] Cardiology recommending medical management, feel NSTEMI secondary to demand ischemia --see below Further recurrence CP 9/18 post procedure--? 2/2 missed BB, Nitrate or related to scope  not cath candidate-Heparin discontinued 2/2 risk of bleed/ cardiology Continuing Toprol-XL 50 daily atorvastatin 40 sublingual nitroglycerin Imdur 60 -->15 daily, hydralazine held Will give aspirin only 81 mg UGIB-EGD 9/18-erosions hemoglobin baseline 11 dropped to 7.5--1 unit 9/18 > 9.1 threshold  below 8 in patient with CAD Ppi BID x 8 wk's, no NSAID and follow h  pylori stool AG Anemia NOS Iron studies don't show IDA.  Likely needs Aranesp.?  Colonic source which he doesn't want to have colonoscopy for--was on bid ASA and also elliquis Outpatient work-up if no other source found with hemolytic panel etc. ESRD MWF Chronic hypotension-continue midodrine 10 mg 3 times daily Aranesp as per renal Eventually when no angina may need to adjust heart meds based on BP Continue Renvela 800 3 times daily Permanent A. 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 Continue amiodarone 200 daily-Eliquis 5 bid resumed after discussion with cardiologist resume 9/18 post-scope per GI some PVCs on the monitor --has afib HFrEF on last echo Volume management with dialysis anuric at baseline DM TY 2 Not on PTA meds--CBG ranging 72-107 eating 100% of meals continue sliding-scale coverage Outpatient reeval   DVT prophylaxis: SCD Code Status: Full Family Communication: None present Disposition:  Status is: Inpatient  Remains inpatient appropriate because:Ongoing active pain requiring inpatient pain management and Ongoing diagnostic testing needed not appropriate for outpatient work up  Dispo: The patient is from: Home              Anticipated d/c is to: Home              Patient currently is not medically stable to d/c.   Difficult to place patient No       Consultants:  Gastroenterology Renal Cardiology  Procedures:  EGD 9/18 Impression:               - Normal esophagus.                           - Erosive gastropathy with no stigmata of recent  bleeding.                           - Normal examined duodenum.                           - No specimens collected.   Antimicrobials: No   Subjective:  No further chest pain no bleed no stool Eating lunch at the bedside without distress    Objective: Vitals:   09/17/21 1100 09/17/21 1130 09/17/21 1145 09/17/21 1230  BP: (!) 140/54 (!) 125/58 108/67 (!) 145/44  Pulse:    85 88  Resp:   16 20  Temp:   97.8 F (36.6 C) 99.2 F (37.3 C)  TempSrc:   Oral Oral  SpO2:   100% 100%  Weight:   94.5 kg   Height:        Intake/Output Summary (Last 24 hours) at 09/17/2021 1354 Last data filed at 09/17/2021 1145 Gross per 24 hour  Intake 616 ml  Output 2000 ml  Net -1384 ml    Filed Weights   09/14/21 1815 09/17/21 0730 09/17/21 1145  Weight: 94.6 kg 96.5 kg 94.5 kg    Examination:   coherent pleasant no distress EOMI NCAT neurodeficit A. fib on monitors, no murmur seems regular Chest clear no added sound Abdomen soft no rebound no guarding No lower extremity edema  Data Reviewed: personally reviewed   CBC    Component Value Date/Time   WBC 7.3 09/17/2021 0219   RBC 3.06 (L) 09/17/2021 0219   HGB 9.1 (L) 09/17/2021 0219   HCT 28.0 (L) 09/17/2021 0219   PLT 152 09/17/2021 0219   MCV 91.5 09/17/2021 0219   MCH 29.7 09/17/2021 0219   MCHC 32.5 09/17/2021 0219   RDW 18.6 (H) 09/17/2021 0219   LYMPHSABS 1.5 01/01/2021 0914   MONOABS 0.6 01/01/2021 0914   EOSABS 0.1 01/01/2021 0914   BASOSABS 0.0 01/01/2021 0914   CMP Latest Ref Rng & Units 09/17/2021 09/16/2021 09/15/2021  Glucose 70 - 99 mg/dL 107(H) 101(H) 75  BUN 6 - 20 mg/dL 47(H) 37(H) 26(H)  Creatinine 0.61 - 1.24 mg/dL 10.55(H) 8.72(H) 6.21(H)  Sodium 135 - 145 mmol/L 130(L) 131(L) 133(L)  Potassium 3.5 - 5.1 mmol/L 5.1 4.6 4.4  Chloride 98 - 111 mmol/L 91(L) 93(L) 95(L)  CO2 22 - 32 mmol/L 24 26 25   Calcium 8.9 - 10.3 mg/dL 9.2 9.0 8.9  Total Protein 6.5 - 8.1 g/dL - - -  Total Bilirubin 0.3 - 1.2 mg/dL - - -  Alkaline Phos 38 - 126 U/L - - -  AST 15 - 41 U/L - - -  ALT 0 - 44 U/L - - -     Radiology Studies: No results found.   Scheduled Meds:  amiodarone  200 mg Oral Daily   apixaban  2.5 mg Oral BID   atorvastatin  40 mg Oral QPM   Chlorhexidine Gluconate Cloth  6 each Topical Q0600   Chlorhexidine Gluconate Cloth  6 each Topical Q0600   darbepoetin (ARANESP)  injection - DIALYSIS  60 mcg Intravenous Q Fri-HD   influenza vac split quadrivalent PF  0.5 mL Intramuscular Tomorrow-1000   insulin aspart  0-6 Units Subcutaneous TID WC   isosorbide mononitrate  15 mg Oral Daily   metoprolol succinate  50 mg Oral Daily   midodrine  10 mg Oral TID WC   mupirocin ointment  1  application Nasal BID   pantoprazole  40 mg Oral BID AC   sevelamer carbonate  800 mg Oral TID WC   Continuous Infusions:     LOS: 5 days   Time spent: 30  Nita Sells, MD [facility] To contact the attending provider between 7A-7P or the covering provider during after hours 7P-7A, please log into the web site [email or web address] and access using universal [place] password for that web site. If you do not have the password, please call the hospital operator.  09/17/2021, 1:54 PM

## 2021-09-17 NOTE — Plan of Care (Signed)
  Problem: Cardiac: Goal: Ability to achieve and maintain adequate cardiopulmonary perfusion will improve Outcome: Progressing   

## 2021-09-18 DIAGNOSIS — R072 Precordial pain: Secondary | ICD-10-CM | POA: Diagnosis not present

## 2021-09-18 LAB — CBC WITH DIFFERENTIAL/PLATELET
Abs Immature Granulocytes: 0.03 10*3/uL (ref 0.00–0.07)
Basophils Absolute: 0 10*3/uL (ref 0.0–0.1)
Basophils Relative: 0 %
Eosinophils Absolute: 0.1 10*3/uL (ref 0.0–0.5)
Eosinophils Relative: 1 %
HCT: 26.3 % — ABNORMAL LOW (ref 39.0–52.0)
Hemoglobin: 8.7 g/dL — ABNORMAL LOW (ref 13.0–17.0)
Immature Granulocytes: 1 %
Lymphocytes Relative: 18 %
Lymphs Abs: 1.2 10*3/uL (ref 0.7–4.0)
MCH: 30.4 pg (ref 26.0–34.0)
MCHC: 33.1 g/dL (ref 30.0–36.0)
MCV: 92 fL (ref 80.0–100.0)
Monocytes Absolute: 0.8 10*3/uL (ref 0.1–1.0)
Monocytes Relative: 12 %
Neutro Abs: 4.3 10*3/uL (ref 1.7–7.7)
Neutrophils Relative %: 68 %
Platelets: 148 10*3/uL — ABNORMAL LOW (ref 150–400)
RBC: 2.86 MIL/uL — ABNORMAL LOW (ref 4.22–5.81)
RDW: 18.8 % — ABNORMAL HIGH (ref 11.5–15.5)
WBC: 6.4 10*3/uL (ref 4.0–10.5)
nRBC: 0.3 % — ABNORMAL HIGH (ref 0.0–0.2)

## 2021-09-18 LAB — COMPREHENSIVE METABOLIC PANEL
ALT: 15 U/L (ref 0–44)
AST: 13 U/L — ABNORMAL LOW (ref 15–41)
Albumin: 3.3 g/dL — ABNORMAL LOW (ref 3.5–5.0)
Alkaline Phosphatase: 41 U/L (ref 38–126)
Anion gap: 12 (ref 5–15)
BUN: 27 mg/dL — ABNORMAL HIGH (ref 6–20)
CO2: 28 mmol/L (ref 22–32)
Calcium: 9.1 mg/dL (ref 8.9–10.3)
Chloride: 94 mmol/L — ABNORMAL LOW (ref 98–111)
Creatinine, Ser: 7.57 mg/dL — ABNORMAL HIGH (ref 0.61–1.24)
GFR, Estimated: 8 mL/min — ABNORMAL LOW (ref >60)
Glucose, Bld: 121 mg/dL — ABNORMAL HIGH (ref 70–99)
Potassium: 4.4 mmol/L (ref 3.5–5.1)
Sodium: 134 mmol/L — ABNORMAL LOW (ref 135–145)
Total Bilirubin: 0.6 mg/dL (ref 0.3–1.2)
Total Protein: 5.9 g/dL — ABNORMAL LOW (ref 6.5–8.1)

## 2021-09-18 LAB — GLUCOSE, CAPILLARY
Glucose-Capillary: 116 mg/dL — ABNORMAL HIGH (ref 70–99)
Glucose-Capillary: 105 mg/dL — ABNORMAL HIGH (ref 70–99)

## 2021-09-18 MED ORDER — PANTOPRAZOLE SODIUM 40 MG PO TBEC: 40.0000 mg | DELAYED_RELEASE_TABLET | Freq: Two times a day (BID) | ORAL | 3 refills | Status: DC

## 2021-09-18 MED ORDER — APIXABAN 5 MG PO TABS: 5.0000 mg | ORAL_TABLET | Freq: Two times a day (BID) | ORAL | 1 refills | Status: DC

## 2021-09-18 MED ORDER — ASPIRIN 81 MG PO CHEW: 81.0000 mg | CHEWABLE_TABLET | Freq: Every day | ORAL | Status: DC

## 2021-09-18 MED ORDER — ISOSORBIDE MONONITRATE ER 30 MG PO TB24: 15.0000 mg | ORAL_TABLET | Freq: Every day | ORAL | 3 refills | Status: DC

## 2021-09-18 MED ORDER — MIDODRINE HCL 10 MG PO TABS: 10.0000 mg | ORAL_TABLET | Freq: Three times a day (TID) | ORAL | 3 refills | Status: DC

## 2021-09-18 MED ORDER — CHLORHEXIDINE GLUCONATE CLOTH 2 % EX PADS: 6.0000 | MEDICATED_PAD | Freq: Every day | CUTANEOUS | Status: DC

## 2021-09-18 NOTE — Progress Notes (Signed)
[facility] ROUNDING NOTE   Subjective:   Interval History: This 50 year old history of hypertension hyperlipidemia diabetes mellitus end-stage renal disease hemodialysis dependent Monday Wednesday Friday at [facility].  Presented with chest pain evaluated by cardiology.  Patient hoping to be discharged home today  Blood pressure 134/98 pulse 81 temperature 98.7 O2 sats 99% room air  Sodium 134 potassium 4.4 chloride 97 CO2 28 BUN 27 creatinine 7.57 glucose 121 albumin 3.3 hemoglobin 8.7   Objective:  Vital signs in last 24 hours:  Temp:  [97.8 F (36.6 C)-99.2 F (37.3 C)] 98.7 F (37.1 C) (09/20 0731) Pulse Rate:  [68-88] 77 (09/20 0731) Resp:  [16-20] 18 (09/20 0731) BP: (100-145)/(44-98) 100/82 (09/20 0731) SpO2:  [100 %] 100 % (09/20 0731) Weight:  [94.5 kg] 94.5 kg (09/19 1145)  Weight change:  Filed Weights   09/14/21 1815 09/17/21 0730 09/17/21 1145  Weight: 94.6 kg 96.5 kg 94.5 kg    Intake/Output: I/O last 3 completed shifts: In: 616 [P.O.:240; Blood:376] Out: 2000 [Other:2000]   Intake/Output this shift:  No intake/output data recorded.  General:NAD, comfortable Heart:RRR, s1s2 nl Lungs: normal wob Abdomen:soft, Non-tender, non-distended Extremities:No edema Dialysis Access: Left AVG has good thrill and bruit.   Basic Metabolic Panel: Recent Labs  Lab 09/13/21 0433 09/14/21 0140 09/15/21 0259 09/16/21 0411 09/17/21 0219 09/18/21 0249  NA 134* 133* 133* 131* 130* 134*  K 3.8 4.5 4.4 4.6 5.1 4.4  CL 95* 93* 95* 93* 91* 94*  CO2 30 29 25 26 24 28   GLUCOSE 90 103* 75 101* 107* 121*  BUN 34* 47* 26* 37* 47* 27*  CREATININE 6.90* 8.88* 6.21* 8.72* 10.55* 7.57*  CALCIUM 8.2* 9.3 8.9 9.0 9.2 9.1  MG 1.8  --   --   --   --   --   PHOS 4.7* 6.1* 4.4 5.2* 5.8*  --      Liver Function Tests: Recent Labs  Lab 09/13/21 0433 09/14/21 0140 09/15/21 0259 09/16/21 0411 09/17/21 0219 09/18/21 0249  AST 14*  --   --   --   --  13*   ALT 18  --   --   --   --  15  ALKPHOS 36*  --   --   --   --  41  BILITOT 0.6  --   --   --   --  0.6  PROT 6.0*  --   --   --   --  5.9*  ALBUMIN 3.5  3.4* 3.3* 3.3* 3.3* 3.5 3.3*    No results for input(s): LIPASE, AMYLASE in the last 168 hours. No results for input(s): AMMONIA in the last 168 hours.  CBC: Recent Labs  Lab 09/14/21 1112 09/15/21 0259 09/16/21 0411 09/17/21 0219 09/18/21 0249  WBC 7.0 6.5 6.9 7.3 6.4  NEUTROABS  --   --   --   --  4.3  HGB 7.4* 7.7* 7.5* 9.1* 8.7*  HCT 23.0* 22.9* 22.8* 28.0* 26.3*  MCV 93.5 91.2 92.7 91.5 92.0  PLT 167 169 149* 152 148*     Cardiac Enzymes: No results for input(s): CKTOTAL, CKMB, CKMBINDEX, TROPONINI in the last 168 hours.  BNP: Invalid input(s): POCBNP  CBG: Recent Labs  Lab 09/17/21 1224 09/17/21 1604 09/17/21 2054 09/18/21 0653 09/18/21 0748  GLUCAP 72 121* 152* 116* 105*     Microbiology: Results for orders placed or performed during the hospital encounter of 09/12/21  Resp Panel by RT-PCR (Flu A&B, Covid) Nasopharyngeal  Swab     Status: None   Collection Time: 09/12/21 12:30 PM   Specimen: Nasopharyngeal Swab; Nasopharyngeal(NP) swabs in vial transport medium  Result Value Ref Range Status   SARS Coronavirus 2 by RT PCR NEGATIVE NEGATIVE Final    Comment: (NOTE) SARS-CoV-2 target nucleic acids are NOT DETECTED.  The SARS-CoV-2 RNA is generally detectable in upper respiratory specimens during the acute phase of infection. The lowest concentration of SARS-CoV-2 viral copies this assay can detect is 138 copies/mL. A negative result does not preclude SARS-Cov-2 infection and should not be used as the sole basis for treatment or other patient management decisions. A negative result may occur with  improper specimen collection/handling, submission of specimen other than nasopharyngeal swab, presence of viral mutation(s) within the areas targeted by this assay, and inadequate number of  viral copies(<138 copies/mL). A negative result must be combined with clinical observations, patient history, and epidemiological information. The expected result is Negative.  Fact Sheet for Patients:  EntrepreneurPulse.com.au  Fact Sheet for Healthcare Providers:  IncredibleEmployment.be  This test is no t yet approved or cleared by the [place] FDA and  has been authorized for detection and/or diagnosis of SARS-CoV-2 by FDA under an Emergency Use Authorization (EUA). This EUA will remain  in effect (meaning this test can be used) for the duration of the COVID-19 declaration under Section 564(b)(1) of the Act, 21 U.S.C.section 360bbb-3(b)(1), unless the authorization is terminated  or revoked sooner.       Influenza A by PCR NEGATIVE NEGATIVE Final   Influenza B by PCR NEGATIVE NEGATIVE Final    Comment: (NOTE) The Xpert Xpress SARS-CoV-2/FLU/RSV plus assay is intended as an aid in the diagnosis of influenza from Nasopharyngeal swab specimens and should not be used as a sole basis for treatment. Nasal washings and aspirates are unacceptable for Xpert Xpress SARS-CoV-2/FLU/RSV testing.  Fact Sheet for Patients: EntrepreneurPulse.com.au  Fact Sheet for Healthcare Providers: IncredibleEmployment.be  This test is not yet approved or cleared by the [place] FDA and has been authorized for detection and/or diagnosis of SARS-CoV-2 by FDA under an Emergency Use Authorization (EUA). This EUA will remain in effect (meaning this test can be used) for the duration of the COVID-19 declaration under Section 564(b)(1) of the Act, 21 U.S.C. section 360bbb-3(b)(1), unless the authorization is terminated or revoked.  Performed at [facility], [address] [place], [place] [address]   MRSA Next Gen by PCR, Nasal     Status: Abnormal   Collection Time: 09/12/21 11:00 PM   Specimen: Nasal Mucosa; Nasal  Swab  Result Value Ref Range Status   MRSA by PCR Next Gen DETECTED (A) NOT DETECTED Final    Comment: RESULT CALLED TO, READ BACK BY AND VERIFIED WITH: LOOMIS,K AT 1007 ON 9.15.22 BY RUCINSKI,B (NOTE) The GeneXpert MRSA Assay (FDA approved for NASAL specimens only), is one component of a comprehensive MRSA colonization surveillance program. It is not intended to diagnose MRSA infection nor to guide or monitor treatment for MRSA infections. Test performance is not FDA approved in patients less than 58 years old. Performed at [facility], [address] [place], [place] [address]     Coagulation Studies: No results for input(s): LABPROT, INR in the last 72 hours.  Urinalysis: No results for input(s): COLORURINE, LABSPEC, PHURINE, GLUCOSEU, HGBUR, BILIRUBINUR, KETONESUR, PROTEINUR, UROBILINOGEN, NITRITE, LEUKOCYTESUR in the last 72 hours.  Invalid input(s): APPERANCEUR    Imaging: No results found.   Medications:     amiodarone  200 mg Oral Daily   apixaban  5 mg Oral BID   aspirin  81 mg Oral Daily   atorvastatin  40 mg Oral QPM   Chlorhexidine Gluconate Cloth  6 each Topical Q0600   Chlorhexidine Gluconate Cloth  6 each Topical Q0600   darbepoetin (ARANESP) injection - DIALYSIS  60 mcg Intravenous Q Fri-HD   insulin aspart  0-6 Units Subcutaneous TID WC   isosorbide mononitrate  15 mg Oral Daily   metoprolol succinate  50 mg Oral Daily   midodrine  10 mg Oral TID WC   pantoprazole  40 mg Oral BID AC   sevelamer carbonate  800 mg Oral TID WC   acetaminophen **OR** acetaminophen, albuterol, bisacodyl, fentaNYL (SUBLIMAZE) injection, nicotine, nitroGLYCERIN, ondansetron **OR** ondansetron (ZOFRAN) IV  Assessment/ Plan:  OP HD: [name], MWF, LUE AVG.   #Chest pain/unstable angina: Troponin mildly elevated.  Plan to discontinue IV heparin because of GI bleed.  Cardiology is following, pending echo.  Started on mdur, metoprolol.  Not sure if patient can actually  tolerate this medication because of hypotension. -medical mgmt   # ESRD: MWF at [place].  Status post HD on 9/14 for 4 hours, unable to UF because of hypotension despite of lowering dialysate temperature and use of albumin.  Started on midodrine 10 mg 3 times daily.  Patient receiving dialysis 09/17/2021.  Patient hoping to go home today.  We will write dialysis for tomorrow in case he stays.   #Hypotension/volume: On cardiac medication for angina.  Midodrine started as above.  UF as tolerated.  Patient reports chronic hypotension as outpatient as well.   # Anemia of ESRD, GIB: Iron saturation 40%.  Received a unit of PRBC on 9/14.  Started ESA with HD.  Monitor hemoglobin and transfuse as needed. GI on board, egd 9/18 w/ Erosive gastropathy, no stigmata of bleeding, patient declines c-scope   # Metabolic Bone Disease: Calcium and phosphorus level at goal.   #Hyponatremia -managing with HD   #Vascular access: LUE AVG, with some prolonged bleeding. Likely outflow stenosis, if anticipating a long inpatient stay then can have IR evaluate him for a shuntoogram otherwise can be done as an outpatient   LOS: [place] @TODAY @11 :23 AM

## 2021-09-18 NOTE — Care Management Important Message (Signed)
Important Message  Patient Details  Name: Shaun Burns MRN: [id number] Date of Birth: Jul 26, 1971   Medicare Important Message Given:  Yes Patient left prior to IM delivery will mail document to the patient home address.      Irma Roulhac 09/18/2021, 3:19 PM

## 2021-09-18 NOTE — Discharge Summary (Signed)
Physician Discharge Summary  Shaun Burns XHB:[id number] DOB: [birth year] DOA: 09/12/2021  PCP: Briant Sites, PA-C  Admit date: 09/12/2021 Discharge date: 09/18/2021  Time spent: 26 minutes  Recommendations for Outpatient Follow-up:  Note medication dosages changes of Eliquis to 5 twice daily and aspirin cut back from twice to once daily New medication Protonix 40 twice daily, certain blood pressure meds were discontinued midodrine 3 times daily was started this admission additionally  will need outpatient nephrology, gastroenterology input and consideration in the outpatient setting for colonoscopy if he has a bleed again--- follow H. pylori stool antigen and biopsies in the outpatient setting by gastroenterology  consider outpatient anemia work-up if still anemic despite no further bleeding Get CBC renal panel 1 week post discharge  Discharge Diagnoses:  MAIN problem for hospitalization   possible upper GI bleed  Please see below for itemized issues addressed in [place]- refer to other progress notes for clarity if needed  Discharge Condition: Stable improved  Diet recommendation: Heart healthy renal  Filed Weights   09/14/21 1815 09/17/21 0730 09/17/21 1145  Weight: 94.6 kg 96.5 kg 94.5 kg    History of present illness:  50 year old black male permanent A. 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-99% CTO mid LAD-(inability to perform PCI of mid LAD at [facility]), sustained VT + syncope + Biotronik ICD placement 01/2021-EF 40-45% at that time ESRD MWF DM TY 2 HTN Chronic tobacco  Presented from dialysis center 9/14 ongoing chest pain about a week numbness over central anterior chest wall-on presentation to ED hemoglobin down from 11-7.6-patient refused to guaiac--no reported melena hematemesis-prior history of gastric ulcer 06/2017 [facility] follow-up EGD gastric erosions Initial troponin was 98 but EKG showed ST  depression in lateral leads   Developed hypotension, troponin bumped to the 600 range concerning for unstable angina, had 1 dark stool Elevated troponins and, ST depressions prompted transfer to [facility] for consideration of cardiac cath-Heparin was stopped by cardiology   Eventually underwent EGD without obvious bleeding source Had further CP and cardiology re-evaluated felt not to be a candidate for further work-up or intervention by cardiology  Hospital Course:  NSTEMI-prior CAD CABG 2019 inability to perform PCI to mid LAD at [facility] Cardiology recommending medical management, feel NSTEMI secondary to demand ischemia --see below Further recurrence CP 9/18 post procedure--? 2/2 missed BB, Nitrate or related to scope  not cath candidate-Heparin discontinued 2/2 risk of bleed/ cardiology Continuing Toprol-XL 50 daily atorvastatin 40 sublingual nitroglycerin Imdur 60 -->15 daily, hydralazine held this admission Cardizem discontinued Will give aspirin only 81 mg instead of twice daily UGIB-EGD 9/18-erosions hemoglobin baseline 11 dropped to 7.5--1 unit 9/18 > 9.1 threshold  below 8 in patient with CAD Ppi BID x 8 wk's, no NSAID and follow h pylori stool AG Anemia NOS Iron studies don't show IDA.  Likely needs Aranesp.?  Colonic source which he doesn't want to have colonoscopy for--was on bid ASA and also elliquis Outpatient work-up if no other source found with hemolytic panel etc. ESRD MWF Chronic hypotension-patient was started and will as an outpatient continue midodrine 10 mg 3 times daily Aranesp as per renal Eventually when no angina may need to adjust heart meds based on BP Continue Renvela 800 3 times daily Permanent A. 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 Studies not automatically included, echos, thoracentesis, etc; not x-rays)  Consultations: Cardiology Gastroenterology  Discharge Exam: Vitals:   09/18/21 0656 09/18/21 0731  BP: (!) 134/98 100/82  Pulse: 77 77  Resp: 20 18  Temp: 98.8 F (37.1 C) 98.7 F (37.1 C)  SpO2: 100% 100%    Subj on day of d/c   Sitting up comfortable no stool no fever no shortness of breath no chest pain no nausea vomiting  General Exam on discharge  Awake coherent EOMI NCAT CTA B no added sound no rales rhonchi Abdomen soft no rebound no guarding ROM intact no focal deficit Neurologically intact moving 4 limbs equally without deficit  Discharge Instructions   Discharge Instructions     Diet - low sodium heart healthy   Complete by: As directed    Discharge instructions   Complete by: As directed    Make sure that you take your medications as directed Some of your heart medication dosages have changed--please follow with ur regular Doctor and go over the changes Please take the new protonix medication which will help coat your stomach and protect u from bleeding-  If u have further blood in stool [u can have some dark old blood] please let ur doctor know--u might need to reconsider colonoscopy   Increase activity slowly   Complete by: As directed    No wound care   Complete by: As directed       Allergies as of 09/18/2021   No Known Allergies      Medication List     STOP taking these medications    aspirin EC 81 MG tablet Replaced by: aspirin 81 MG chewable tablet   carvedilol 12.5 MG tablet Commonly known as: COREG   diltiazem 180 MG 24 hr capsule Commonly known as: CARDIZEM CD       TAKE these medications     acetaminophen 500 MG tablet Commonly known as: TYLENOL Take 1,500 mg by mouth 2 (two) times daily as needed for moderate pain.   albuterol 108 (90 Base) MCG/ACT inhaler Commonly known as: VENTOLIN HFA Inhale 2 puffs into the lungs as needed for wheezing or shortness of breath.   amiodarone 200 MG tablet Commonly known as: PACERONE Take 200 mg by mouth daily.   apixaban 5 MG Tabs tablet Commonly known as: ELIQUIS Take 1 tablet (5 mg total) by mouth 2 (two) times daily. What changed: how much to take   aspirin 81 MG chewable tablet Chew 1 tablet (81 mg total) by mouth daily. Replaces: aspirin EC 81 MG tablet   atorvastatin 40 MG tablet Commonly known as: LIPITOR Take 40 mg by mouth every evening.   isosorbide mononitrate 30 MG 24 hr tablet Commonly known as: IMDUR Take 0.5 tablets (15 mg total) by mouth daily. What changed:  medication strength how much to take   metoprolol succinate 50 MG 24 hr tablet Commonly known as: TOPROL-XL Take 50 mg by mouth daily.   midodrine 10 MG tablet Commonly known as: PROAMATINE Take 1 tablet (10 mg total) by mouth 3 (three) times daily with meals.   pantoprazole 40 MG tablet Commonly known as: PROTONIX Take 1 tablet (40 mg total) by mouth 2 (two) times daily  before a meal.   sevelamer carbonate 800 MG tablet Commonly known as: RENVELA Take 800 mg by mouth in the morning and at bedtime.       No Known Allergies    The results of significant diagnostics from this hospitalization (including imaging, microbiology, ancillary and laboratory) are listed below for reference.    Significant Diagnostic Studies: DG Chest 2 View  Result Date: 09/12/2021 CLINICAL DATA:  Chest pain. EXAM: CHEST - 2 VIEW COMPARISON:  02/10/2021 FINDINGS: Two views of the chest demonstrate a right chest single lead ICD. Heart size is within normal limits. Both lungs are clear without pulmonary edema or focal airspace disease. No pleural effusions. Prior  median sternotomy. IMPRESSION: 1. No acute cardiopulmonary disease. Electronically Signed   By: Markus Daft M.D.   On: 09/12/2021 09:56   ECHOCARDIOGRAM COMPLETE  Result Date: 09/13/2021    ECHOCARDIOGRAM REPORT   Patient Name:   Shaun Burns Date of Exam: 09/13/2021 Medical Rec #:  [id number]     Height:       70.0 in Accession #:    [id number]    Weight:       206.6 lb Date of Birth:  12-Sep-1971      BSA:          2.116 m Patient Age:    66 years      BP:           101/60 mmHg Patient Gender: M             HR:           71 bpm. Exam Location:  [facility] Procedure: 2D Echo, Cardiac Doppler and Color Doppler Indications:    Chest Pain  History:        Patient has prior history of Echocardiogram examinations, most                 recent 10/17/2020. CAD and Previous Myocardial Infarction,                 Defibrillator, Abnormal ECG and Prior CABG, Arrythmias:Atrial                 Fibrillation, Signs/Symptoms:Chest Pain; Risk Factors:Tobacco                 abuse, Hypertension and Diabetes.  Sonographer:    Wenda Low Referring Phys: [place]  1. Left ventricular ejection fraction, by estimation, is 50 to 55%. The left ventricle has low normal function. The left ventricle demonstrates regional wall motion abnormalities (see scoring diagram/findings for description). There is moderate concentric left ventricular hypertrophy. Left ventricular diastolic parameters are indeterminate. Elevated left ventricular end-diastolic pressure.  2. Right ventricular systolic function is normal. The right ventricular size is normal. There is mildly elevated pulmonary artery systolic pressure. The estimated right ventricular systolic pressure is 53.6 mmHg.  3. Left atrial size was severely dilated.  4. The mitral valve is abnormal. Moderate mitral valve regurgitation. Moderate mitral annular calcification.  5. Tricuspid valve regurgitation is moderate.  6. The aortic valve is tricuspid, calcified with  restricted motion of the left coronary cusp. Aortic valve regurgitation is not visualized. Mild aortic valve sclerosis is present, with no evidence of aortic valve stenosis. Aortic valve mean gradient measures 6.7 mmHg.  7. The inferior vena cava is dilated in size with >50% respiratory variability, suggesting right atrial pressure of 8 mmHg. Comparison(s): Prior images reviewed side by side. LVEF similar range. FINDINGS  Left Ventricle: Left ventricular ejection fraction, by estimation, is 50 to 55%. The left ventricle has low normal function. The left ventricle demonstrates regional wall motion abnormalities. The left ventricular internal cavity size was normal in size. There is moderate concentric left ventricular hypertrophy. Left ventricular diastolic parameters are indeterminate. Elevated left ventricular end-diastolic pressure.  LV Wall Scoring: The posterior wall and basal inferior segment are hypokinetic. The entire anterior wall, antero-lateral wall, entire septum, entire apex, and mid and distal inferior wall are normal. Right Ventricle: The right ventricular size is normal. No increase in right ventricular wall thickness. Right ventricular systolic function is normal. There is mildly elevated pulmonary artery systolic pressure. The tricuspid regurgitant velocity is 2.79  m/s, and with an assumed right atrial pressure of 8 mmHg, the estimated right ventricular systolic pressure is 82.9 mmHg. Left Atrium: Left atrial size was severely dilated. Right Atrium: Right atrial size was normal in size. Pericardium: There is no evidence of pericardial effusion. Mitral Valve: The mitral valve is abnormal. Moderate mitral annular calcification. Moderate mitral valve regurgitation, with posteriorly-directed jet. MV peak gradient, 16.5 mmHg. The mean mitral valve gradient is 6.0 mmHg. Tricuspid Valve: The tricuspid valve is grossly normal. Tricuspid valve regurgitation is moderate. Aortic Valve: The aortic valve is  tricuspid. Aortic valve regurgitation is not visualized. Mild aortic valve sclerosis is present, with no evidence of aortic valve stenosis. Aortic valve mean gradient measures 6.7 mmHg. Aortic valve peak gradient measures 12.1 mmHg. Aortic valve area, by VTI measures 1.76 cm. Pulmonic Valve: The pulmonic valve was grossly normal. Pulmonic valve regurgitation is trivial. Aorta: The aortic root is normal in size and structure. Venous: The inferior vena cava is dilated in size with greater than 50% respiratory variability, suggesting right atrial pressure of 8 mmHg. IAS/Shunts: No atrial level shunt detected by color flow Doppler. Additional Comments: A device lead is visualized.  LEFT VENTRICLE PLAX 2D LVIDd:         4.90 cm      Diastology LVIDs:         3.30 cm      LV e' lateral:   11.00 cm/s LV PW:         1.60 cm      LV E/e' lateral: 18.0 LV IVS:        1.50 cm LVOT diam:     2.10 cm LV SV:         57 LV SV Index:   27 LVOT Area:     3.46 cm  LV Volumes (MOD) LV vol d, MOD A2C: 116.0 ml LV vol d, MOD A4C: 130.0 ml LV vol s, MOD A2C: 67.7 ml LV vol s, MOD A4C: 66.3 ml LV SV MOD A2C:     48.3 ml LV SV MOD A4C:     130.0 ml LV SV MOD BP:      58.3 ml RIGHT VENTRICLE RV Basal diam:  4.30 cm RV Mid diam:    3.30 cm RV S prime:     9.00 cm/s TAPSE (M-mode): 1.8 cm LEFT ATRIUM              Index       RIGHT ATRIUM           Index LA diam:        5.00 cm  2.36 cm/m  RA Area:     24.00 cm LA Vol (A2C):   154.0 ml 72.77 ml/m RA Volume:   71.40 ml  33.74 ml/m  LA Vol (A4C):   120.0 ml 56.71 ml/m LA Biplane Vol: 142.0 ml 67.10 ml/m  AORTIC VALVE AV Area (Vmax):    1.64 cm AV Area (Vmean):   1.60 cm AV Area (VTI):     1.76 cm AV Vmax:           173.67 cm/s AV Vmean:          120.333 cm/s AV VTI:            0.324 m AV Peak Grad:      12.1 mmHg AV Mean Grad:      6.7 mmHg LVOT Vmax:         82.20 cm/s LVOT Vmean:        55.600 cm/s LVOT VTI:          0.165 m LVOT/AV VTI ratio: 0.51  AORTA Ao Root diam: 3.20 cm MITRAL  VALVE                TRICUSPID VALVE MV Area (PHT): 2.84 cm     TR Peak grad:   31.1 mmHg MV Area VTI:   1.32 cm     TR Vmax:        279.00 cm/s MV Peak grad:  16.5 mmHg MV Mean grad:  6.0 mmHg     SHUNTS MV Vmax:       2.03 m/s     Systemic VTI:  0.16 m MV Vmean:      111.0 cm/s   Systemic Diam: 2.10 cm MV Decel Time: 267 msec MV E velocity: 198.00 cm/s Rozann Lesches MD Electronically signed by Rozann Lesches MD Signature Date/Time: 09/13/2021/12:31:34 PM    Final     Microbiology: Recent Results (from the past 240 hour(s))  Resp Panel by RT-PCR (Flu A&B, Covid) Nasopharyngeal Swab     Status: None   Collection Time: 09/12/21 12:30 PM   Specimen: Nasopharyngeal Swab; Nasopharyngeal(NP) swabs in vial transport medium  Result Value Ref Range Status   SARS Coronavirus 2 by RT PCR NEGATIVE NEGATIVE Final    Comment: (NOTE) SARS-CoV-2 target nucleic acids are NOT DETECTED.  The SARS-CoV-2 RNA is generally detectable in upper respiratory specimens during the acute phase of infection. The lowest concentration of SARS-CoV-2 viral copies this assay can detect is 138 copies/mL. A negative result does not preclude SARS-Cov-2 infection and should not be used as the sole basis for treatment or other patient management decisions. A negative result may occur with  improper specimen collection/handling, submission of specimen other than nasopharyngeal swab, presence of viral mutation(s) within the areas targeted by this assay, and inadequate number of viral copies(<138 copies/mL). A negative result must be combined with clinical observations, patient history, and epidemiological information. The expected result is Negative.  Fact Sheet for Patients:  EntrepreneurPulse.com.au  Fact Sheet for Healthcare Providers:  IncredibleEmployment.be  This test is no t yet approved or cleared by the [place] FDA and  has been authorized for detection and/or diagnosis of  SARS-CoV-2 by FDA under an Emergency Use Authorization (EUA). This EUA will remain  in effect (meaning this test can be used) for the duration of the COVID-19 declaration under Section 564(b)(1) of the Act, 21 U.S.C.section 360bbb-3(b)(1), unless the authorization is terminated  or revoked sooner.       Influenza A by PCR NEGATIVE NEGATIVE Final   Influenza B by PCR NEGATIVE NEGATIVE Final    Comment: (NOTE) The Xpert Xpress SARS-CoV-2/FLU/RSV plus assay is intended as an aid in the  diagnosis of influenza from Nasopharyngeal swab specimens and should not be used as a sole basis for treatment. Nasal washings and aspirates are unacceptable for Xpert Xpress SARS-CoV-2/FLU/RSV testing.  Fact Sheet for Patients: EntrepreneurPulse.com.au  Fact Sheet for Healthcare Providers: IncredibleEmployment.be  This test is not yet approved or cleared by the [place] FDA and has been authorized for detection and/or diagnosis of SARS-CoV-2 by FDA under an Emergency Use Authorization (EUA). This EUA will remain in effect (meaning this test can be used) for the duration of the COVID-19 declaration under Section 564(b)(1) of the Act, 21 U.S.C. section 360bbb-3(b)(1), unless the authorization is terminated or revoked.  Performed at [facility], [address], [place], [place] [address]   MRSA Next Gen by PCR, Nasal     Status: Abnormal   Collection Time: 09/12/21 11:00 PM   Specimen: Nasal Mucosa; Nasal Swab  Result Value Ref Range Status   MRSA by PCR Next Gen DETECTED (A) NOT DETECTED Final    Comment: RESULT CALLED TO, READ BACK BY AND VERIFIED WITH: LOOMIS,K AT 1007 ON 9.15.22 BY RUCINSKI,B (NOTE) The GeneXpert MRSA Assay (FDA approved for NASAL specimens only), is one component of a comprehensive MRSA colonization surveillance program. It is not intended to diagnose MRSA infection nor to guide or monitor treatment for MRSA infections. Test  performance is not FDA approved in patients less than 67 years old. Performed at [facility], [address] [place], [place] [address]      Labs: Basic Metabolic Panel: Recent Labs  Lab 09/13/21 0433 09/14/21 0140 09/15/21 0259 09/16/21 0411 09/17/21 0219 09/18/21 0249  NA 134* 133* 133* 131* 130* 134*  K 3.8 4.5 4.4 4.6 5.1 4.4  CL 95* 93* 95* 93* 91* 94*  CO2 30 29 25 26 24 28   GLUCOSE 90 103* 75 101* 107* 121*  BUN 34* 47* 26* 37* 47* 27*  CREATININE 6.90* 8.88* 6.21* 8.72* 10.55* 7.57*  CALCIUM 8.2* 9.3 8.9 9.0 9.2 9.1  MG 1.8  --   --   --   --   --   PHOS 4.7* 6.1* 4.4 5.2* 5.8*  --    Liver Function Tests: Recent Labs  Lab 09/13/21 0433 09/14/21 0140 09/15/21 0259 09/16/21 0411 09/17/21 0219 09/18/21 0249  AST 14*  --   --   --   --  13*  ALT 18  --   --   --   --  15  ALKPHOS 36*  --   --   --   --  41  BILITOT 0.6  --   --   --   --  0.6  PROT 6.0*  --   --   --   --  5.9*  ALBUMIN 3.5  3.4* 3.3* 3.3* 3.3* 3.5 3.3*   No results for input(s): LIPASE, AMYLASE in the last 168 hours. No results for input(s): AMMONIA in the last 168 hours. CBC: Recent Labs  Lab 09/14/21 1112 09/15/21 0259 09/16/21 0411 09/17/21 0219 09/18/21 0249  WBC 7.0 6.5 6.9 7.3 6.4  NEUTROABS  --   --   --   --  4.3  HGB 7.4* 7.7* 7.5* 9.1* 8.7*  HCT 23.0* 22.9* 22.8* 28.0* 26.3*  MCV 93.5 91.2 92.7 91.5 92.0  PLT 167 169 149* 152 148*   Cardiac Enzymes: No results for input(s): CKTOTAL, CKMB, CKMBINDEX, TROPONINI in the last 168 hours. BNP: BNP (last 3 results) Recent Labs    10/16/20 1007  BNP 1,898.0*  ProBNP (last 3 results) No results for input(s): PROBNP in the last 8760 hours.  CBG: Recent Labs  Lab 09/17/21 1224 09/17/21 1604 09/17/21 2054 09/18/21 0653 09/18/21 0748  GLUCAP 72 121* 152* 116* 105*       Signed:  Nita Sells MD   [facility] 09/18/2021, 10:21 AM

## 2021-09-18 NOTE — Progress Notes (Signed)
Progress Note  Patient Name: Shaun Burns Date of Encounter: 09/18/2021  [facility] Cardiologist: Carlyle Dolly, MD   Subjective   No significant events overnight. He had one brief episodes of mild chest pain yesterday after coming back from dialysis but only lasted for about 10 minutes and was not as severe as what brought him in. No shortness of breath. Eager to go home.  Inpatient Medications    Scheduled Meds:  amiodarone  200 mg Oral Daily   apixaban  5 mg Oral BID   aspirin  81 mg Oral Daily   atorvastatin  40 mg Oral QPM   Chlorhexidine Gluconate Cloth  6 each Topical Q0600   Chlorhexidine Gluconate Cloth  6 each Topical Q0600   darbepoetin (ARANESP) injection - DIALYSIS  60 mcg Intravenous Q Fri-HD   insulin aspart  0-6 Units Subcutaneous TID WC   isosorbide mononitrate  15 mg Oral Daily   metoprolol succinate  50 mg Oral Daily   midodrine  10 mg Oral TID WC   pantoprazole  40 mg Oral BID AC   sevelamer carbonate  800 mg Oral TID WC   Continuous Infusions:  PRN Meds: acetaminophen **OR** acetaminophen, albuterol, bisacodyl, fentaNYL (SUBLIMAZE) injection, nicotine, nitroGLYCERIN, ondansetron **OR** ondansetron (ZOFRAN) IV   Vital Signs    Vitals:   09/17/21 1437 09/17/21 2008 09/18/21 0656 09/18/21 0731  BP: (!) 142/87 (!) 136/59 (!) 134/98 100/82  Pulse: 68 78 77 77  Resp: 20 20 20 18   Temp: 99.1 F (37.3 C) 98.9 F (37.2 C) 98.8 F (37.1 C) 98.7 F (37.1 C)  TempSrc: Oral Oral Oral Oral  SpO2:  100% 100% 100%  Weight:      Height:        Intake/Output Summary (Last 24 hours) at 09/18/2021 0804 Last data filed at 09/17/2021 1145 Gross per 24 hour  Intake --  Output 2000 ml  Net -2000 ml   Last 3 Weights 09/17/2021 09/17/2021 09/14/2021  Weight (lbs) 208 lb 5.4 oz 212 lb 11.9 oz 208 lb 8.9 oz  Weight (kg) 94.5 kg 96.5 kg 94.6 kg      Telemetry    Permanent atrial fibrillation with rates in the 70s to 80s. Occasional PVCs. No VT. -  Personally Reviewed  ECG    No new ECG tracing today. - Personally Reviewed  Physical Exam   GEN: No acute distress.   Neck: No JVD. Cardiac: Irregularly irregular rhythm with normal rate. II/VI systolic murmur. No rubs or gallops.  Respiratory: Clear to auscultation bilaterally. GI: Soft, non-distended, and non-tender. MS: No lower extremity edema. No deformity. Skin: Warm and dry. Neuro:  No focal deficits. Psych: Normal affect. Responds appropriately.  Labs    High Sensitivity Troponin:   Recent Labs  Lab 09/12/21 1912 09/12/21 2054 09/12/21 2318 09/13/21 0211 09/13/21 0433  TROPONINIHS 458* 537* 513* 512* 600*     Chemistry Recent Labs  Lab 09/13/21 2130 09/14/21 0140 09/16/21 0411 09/17/21 0219 09/18/21 0249  NA 134*   < > 131* 130* 134*  K 3.8   < > 4.6 5.1 4.4  CL 95*   < > 93* 91* 94*  CO2 30   < > 26 24 28   GLUCOSE 90   < > 101* 107* 121*  BUN 34*   < > 37* 47* 27*  CREATININE 6.90*   < > 8.72* 10.55* 7.57*  CALCIUM 8.2*   < > 9.0 9.2 9.1  MG 1.8  --   --   --   --  PROT 6.0*  --   --   --  5.9*  ALBUMIN 3.5  3.4*   < > 3.3* 3.5 3.3*  AST 14*  --   --   --  13*  ALT 18  --   --   --  15  ALKPHOS 36*  --   --   --  41  BILITOT 0.6  --   --   --  0.6  GFRNONAA 9*   < > 7* 5* 8*  ANIONGAP 9   < > 12 15 12    < > = values in this interval not displayed.    Lipids  Recent Labs  Lab 09/13/21 0433  CHOL 89  TRIG 21  HDL 39*  LDLCALC 46  CHOLHDL 2.3    Hematology Recent Labs  Lab 09/16/21 0411 09/17/21 0219 09/18/21 0249  WBC 6.9 7.3 6.4  RBC 2.46* 3.06* 2.86*  HGB 7.5* 9.1* 8.7*  HCT 22.8* 28.0* 26.3*  MCV 92.7 91.5 92.0  MCH 30.5 29.7 30.4  MCHC 32.9 32.5 33.1  RDW 18.9* 18.6* 18.8*  PLT 149* 152 148*   Thyroid No results for input(s): TSH, FREET4 in the last 168 hours.  BNPNo results for input(s): BNP, PROBNP in the last 168 hours.  DDimer No results for input(s): DDIMER in the last 168 hours.   Radiology    No results  found.  Cardiac Studies   Echocardiogram 09/13/2021: Impressions: 1. Left ventricular ejection fraction, by estimation, is 50 to 55%. The  left ventricle has low normal function. The left ventricle demonstrates  regional wall motion abnormalities (see scoring diagram/findings for  description). There is moderate  concentric left ventricular hypertrophy. Left ventricular diastolic  parameters are indeterminate. Elevated left ventricular end-diastolic  pressure.   2. Right ventricular systolic function is normal. The right ventricular  size is normal. There is mildly elevated pulmonary artery systolic  pressure. The estimated right ventricular systolic pressure is 38.1 mmHg.   3. Left atrial size was severely dilated.   4. The mitral valve is abnormal. Moderate mitral valve regurgitation.  Moderate mitral annular calcification.   5. Tricuspid valve regurgitation is moderate.   6. The aortic valve is tricuspid, calcified with restricted motion of the  left coronary cusp. Aortic valve regurgitation is not visualized. Mild  aortic valve sclerosis is present, with no evidence of aortic valve  stenosis. Aortic valve mean gradient  measures 6.7 mmHg.   7. The inferior vena cava is dilated in size with >50% respiratory  variability, suggesting right atrial pressure of 8 mmHg.   Comparison(s): Prior images reviewed side by side. LVEF similar range.    LV Wall Scoring:  The posterior wall and basal inferior segment are hypokinetic. The entire  anterior wall, antero-lateral wall, entire septum, entire apex, and mid  and distal inferior wall are normal.   Patient Profile     50 y.o. male with a history of CAD s/p CABG x2 (LIMA to LAD and SVG to RCA) in 2019, ischemic cardiomyopathy with improved EF of 50-55%, sustained VT s/p ICD in 01/2021, persistent atrial fibrillation Eliquis, hypertension, type 2 diabetes mellitus, ESRD on hemodialysis on M/W/F, and GI bleed who was admitted on 09/12/2021  with chest pain in setting of acute anemia.  Assessment & Plan    Chest Pain Demand Ischemia History of CAD s/p CABG - Patient presented with chest pain in the setting of acute anemia. He has known CAD s/p CABG x2 in 2019. Last cath  in 01/2021 showed occluded SVG to RCA and severe mid LAD lesion after anastomosis of the LIMA to LAD which was not intervened upon due to technical difficulties at [facility]. - High-sensitivity troponin 120 >> 326 >> 458 >> 537 >> 513 >> 512 >> 600. - Echo showed LVEF of 50-55% with multiple wall motion abnormalities as described above. - Felt to be due to demand ischemia in the setting of anemia. EGD showed gastritis. He was transfused 1 unit of PRBCs yesterday. - He had one short episodes of mild chest pain yesterday after coming back dialysis but was not as severe as presenting symptoms. Wonder if this was due to low BP after dialysis. - Continue Imdur 15mg  daily and Toprol-XL 50mg  daily. - Continue Lipitor 40mg  daily. - Aspirin was restarted yesterday. - No additional ischemic work-up planned at this time.   Ischemic Cardiomyopathy - Echo showed EF has improved to 50-55%.  - Volume status managed via hemodialysis. - Continue Toprol-XL 50mg  daily. - No ACE/ARB/ARNI or MRA due to ESRD.   History of Sustained VT s/p ICD - PVC noted on telemetry but no VT.  - Continue Amiodarone 200mg  daily and Toprol-XL 50mg  daily.   Persistent Atrial Fibrillation - Rates controlled.  - Continue Amiodarone 200mg  daily and Toprol-XL 50mg  daily. - Home Eliquis was held on admission due to anemia. EGD showed gastritis. Received 1 unit of PRBCs on 9/14 and another on 9/18. Hemoglobin stable at 8.7.  Continue Eliquis 5mg  twice daily.   Acute on Chronic Anemia - Hemoglobin 7.6 on admission and dropped as low as 7.1 on 09/13/2021.  - S/p 1 unit of PRBC on 9/14 and another on 9/18 due to hemoglobin remaining below 8. Hemoglobin 8.7 today, down from 9.1 yesterday. - EGD on 9/18 showed  erosive gastropathy with no stigmata of recent bleeding. Patient declined colonoscopy. - Continue to monitor hemoglobin closely now that Eliquis and Aspirin have been restarted. - Management per GI and primary team.   ESRD on Hemodialysis - Management per Nephrology.   Hypotension - BP very soft at times. Systolic BP in the 97J to 80s this morning (if accurate) but currently stable in the low 100s. Patient also reported chronic hypotension as an outpatient. - Nephrology started Midodrine 10mg  three times daily.   Otherwise, per primary team.  For questions or updates, please contact [place] Please consult [email or web address] for contact info under        Signed, Darreld Mclean, PA-C  09/18/2021, 8:04 AM

## 2021-09-19 ENCOUNTER — Encounter: Payer: Self-pay | Admitting: Internal Medicine

## 2021-09-19 LAB — H. PYLORI ANTIGEN, STOOL: H. Pylori Stool Ag, Eia: NEGATIVE

## 2021-09-19 NOTE — Progress Notes (Signed)
Hi Ammie, could you let Shaun Burns know that his stool was negative for H pylori? A call or letter would be fine. Thanks.

## 2021-09-22 ENCOUNTER — Emergency Department (HOSPITAL_COMMUNITY)
Admission: EM | Admit: 2021-09-22 | Discharge: 2021-09-23 | Disposition: A | Discharge status: Home / Self Care | Payer: Medicare Other | Attending: Emergency Medicine | Admitting: Emergency Medicine

## 2021-09-22 ENCOUNTER — Encounter (HOSPITAL_COMMUNITY): Payer: Self-pay

## 2021-09-22 ENCOUNTER — Other Ambulatory Visit: Payer: Self-pay

## 2021-09-22 DIAGNOSIS — I2510 Atherosclerotic heart disease of native coronary artery without angina pectoris: Secondary | ICD-10-CM | POA: Insufficient documentation

## 2021-09-22 DIAGNOSIS — E1122 Type 2 diabetes mellitus with diabetic chronic kidney disease: Secondary | ICD-10-CM | POA: Insufficient documentation

## 2021-09-22 DIAGNOSIS — L03116 Cellulitis of left lower limb: Secondary | ICD-10-CM

## 2021-09-22 DIAGNOSIS — Z7982 Long term (current) use of aspirin: Secondary | ICD-10-CM | POA: Diagnosis not present

## 2021-09-22 DIAGNOSIS — Z951 Presence of aortocoronary bypass graft: Secondary | ICD-10-CM | POA: Insufficient documentation

## 2021-09-22 DIAGNOSIS — N186 End stage renal disease: Secondary | ICD-10-CM | POA: Insufficient documentation

## 2021-09-22 DIAGNOSIS — Z992 Dependence on renal dialysis: Secondary | ICD-10-CM | POA: Insufficient documentation

## 2021-09-22 DIAGNOSIS — Z79899 Other long term (current) drug therapy: Secondary | ICD-10-CM | POA: Insufficient documentation

## 2021-09-22 DIAGNOSIS — F17210 Nicotine dependence, cigarettes, uncomplicated: Secondary | ICD-10-CM | POA: Diagnosis not present

## 2021-09-22 DIAGNOSIS — M79672 Pain in left foot: Secondary | ICD-10-CM | POA: Diagnosis present

## 2021-09-22 DIAGNOSIS — I120 Hypertensive chronic kidney disease with stage 5 chronic kidney disease or end stage renal disease: Secondary | ICD-10-CM | POA: Insufficient documentation

## 2021-09-22 DIAGNOSIS — D631 Anemia in chronic kidney disease: Secondary | ICD-10-CM | POA: Diagnosis not present

## 2021-09-22 DIAGNOSIS — I482 Chronic atrial fibrillation, unspecified: Secondary | ICD-10-CM | POA: Insufficient documentation

## 2021-09-22 DIAGNOSIS — Z7901 Long term (current) use of anticoagulants: Secondary | ICD-10-CM | POA: Diagnosis not present

## 2021-09-22 MED ORDER — HYDROMORPHONE HCL 1 MG/ML IJ SOLN
1.0000 mg | Freq: Once | INTRAMUSCULAR | Status: AC
  Administered 2021-09-23: 1 mg via INTRAMUSCULAR
  Filled 2021-09-22: qty 1

## 2021-09-22 MED ORDER — CEPHALEXIN 500 MG PO CAPS
1000.0000 mg | ORAL_CAPSULE | Freq: Once | ORAL | Status: AC
  Administered 2021-09-23: 1000 mg via ORAL
  Filled 2021-09-22: qty 2

## 2021-09-22 MED ORDER — OXYCODONE-ACETAMINOPHEN 5-325 MG PO TABS: 1.0000 | ORAL_TABLET | Freq: Four times a day (QID) | ORAL | 0 refills | Status: DC | PRN

## 2021-09-22 MED ORDER — CEPHALEXIN 500 MG PO CAPS: 500.0000 mg | ORAL_CAPSULE | Freq: Four times a day (QID) | ORAL | 0 refills | Status: DC

## 2021-09-22 NOTE — ED Triage Notes (Signed)
Pt to Ed from home c/o left heel pain that started this morning when he got up, denies injury

## 2021-09-23 DIAGNOSIS — L03116 Cellulitis of left lower limb: Secondary | ICD-10-CM | POA: Diagnosis not present

## 2021-09-23 NOTE — ED Provider Notes (Signed)
[facility] EMERGENCY DEPARTMENT Provider Note   CSN: [id number] Arrival date & time: 09/22/21  2145     History Chief Complaint  Patient presents with   Foot Pain    Denies injury    Shaun Burns is a 50 y.o. male.   Foot Pain This is a new problem. The current episode started 6 to 12 hours ago. The problem occurs constantly. The problem has not changed since onset.Pertinent negatives include no chest pain, no abdominal pain, no headaches and no shortness of breath. Nothing aggravates the symptoms. Nothing relieves the symptoms. He has tried nothing for the symptoms. The treatment provided no relief.      Past Medical History:  Diagnosis Date   CAD (coronary artery disease)    a. s/p CABG in 2019 with LIMA-LAD-D1 and SVG-PDA b. cath 01/2021 [facility] showing CTO mLAD unable to be crossed with wire, diffusely diseased small circ, LIMA-D2-LAD patent, SVG-RCA occluded, and CTO RCA with L-->R collaterals and medical management was recommended   ESRD on hemodialysis [facility])    Essential hypertension    GERD (gastroesophageal reflux disease)    Headache    History of GI bleed    Ischemic cardiomyopathy    Biotronik single chamber ICD (VDD lead) - February 2022 [facility]   Sustained VT (ventricular tachycardia) [facility])    February 2022   Type 2 diabetes mellitus [facility])     Patient Active Problem List   Diagnosis Date Noted   Chest pain 09/12/2021   Abnormal electrocardiogram (ECG) (EKG) 09/12/2021   Elevated troponin 09/12/2021   Anemia in chronic kidney disease (CKD) 09/12/2021   Refuses rectal examination for guiac testing 09/12/2021   NSTEMI (non-ST elevated myocardial infarction) ([place]) 09/12/2021   Symptomatic anemia    Atrial fibrillation with RVR (HCC)    Chronic atrial fibrillation ([place]) 10/16/2020   GERD (gastroesophageal reflux disease)    Noncompliance with medication regimen    Hypertension    Type 2 diabetes mellitus (HCC)    Tobacco abuse    S/P CABG (coronary  artery bypass graft)    CAD (coronary artery disease)    ESRD on dialysis ([place]) 01/04/2016    Past Surgical History:  Procedure Laterality Date   A/V FISTULAGRAM N/A 08/13/2018   Procedure: A/V FISTULAGRAM;  Surgeon: Marty Heck, MD;  Location: [place] CV [facility];  Service: Cardiovascular;  Laterality: N/A;   A/V FISTULAGRAM N/A 01/25/2020   Procedure: A/V FISTULAGRAM - Left Arm;  Surgeon: Serafina Mitchell, MD;  Location: [place] [facility];  Service: Cardiovascular;  Laterality: N/A;   AV FISTULA PLACEMENT Left 03/30/2015   Procedure: LEFT BRACHIOCEPHALIC ARTERIOVENOUS (AV) FISTULA CREATION;  Surgeon: Elam Dutch, MD;  Location: [place];  Service: Vascular;  Laterality: Left;   CORONARY ARTERY BYPASS GRAFT  2019   ESOPHAGOGASTRODUODENOSCOPY (EGD) WITH PROPOFOL N/A 09/16/2021   Procedure: ESOPHAGOGASTRODUODENOSCOPY (EGD) WITH PROPOFOL;  Surgeon: Sharyn Creamer, MD;  Location: [place];  Service: Gastroenterology;  Laterality: N/A;   KNEE SURGERY     PERIPHERAL VASCULAR BALLOON ANGIOPLASTY Left 08/13/2018   Procedure: PERIPHERAL VASCULAR BALLOON ANGIOPLASTY;  Surgeon: Marty Heck, MD;  Location: [place] CV LAB;  Service: Cardiovascular;  Laterality: Left;  central venous    PERIPHERAL VASCULAR BALLOON ANGIOPLASTY Left 01/25/2020   Procedure: PERIPHERAL VASCULAR BALLOON ANGIOPLASTY;  Surgeon: Serafina Mitchell, MD;  Location: [place] CV LAB;  Service: Cardiovascular;  Laterality: Left;  arm fistula   PERIPHERAL VASCULAR CATHETERIZATION Left 01/11/2016  Procedure: A/V Shuntogram/Fistulagram;  Surgeon: Conrad Imperial, MD;  Location: [place] [facility];  Service: Cardiovascular;  Laterality: Left;       Family History  Problem Relation Age of Onset   Hypertension Father    Stroke Father    Diabetes Mother     Social History   Tobacco Use   Smoking status: Every Day    Packs/day: 0.50    Types: Cigarettes   Smokeless tobacco: Never  Substance Use  Topics   Alcohol use: No    Alcohol/week: 0.0 standard drinks   Drug use: No    Home Medications Prior to Admission medications   Medication Sig Start Date End Date Taking? Authorizing Provider  cephALEXin (KEFLEX) 500 MG capsule Take 1 capsule (500 mg total) by mouth 4 (four) times daily. 09/22/21  Yes Zac Torti, Corene Cornea, MD  oxyCODONE-acetaminophen (PERCOCET/ROXICET) 5-325 MG tablet Take 1 tablet by mouth every 6 (six) hours as needed for severe pain. 09/22/21  Yes Demetrious Rainford, Corene Cornea, MD  acetaminophen (TYLENOL) 500 MG tablet Take 1,500 mg by mouth 2 (two) times daily as needed for moderate pain.     [provider]  albuterol (VENTOLIN HFA) 108 (90 Base) MCG/ACT inhaler Inhale 2 puffs into the lungs as needed for wheezing or shortness of breath.    [provider]  amiodarone (PACERONE) 200 MG tablet Take 200 mg by mouth daily.    [provider]  apixaban (ELIQUIS) 5 MG TABS tablet Take 1 tablet (5 mg total) by mouth 2 (two) times daily. 09/18/21   Nita Sells, MD  aspirin 81 MG chewable tablet Chew 1 tablet (81 mg total) by mouth daily. 09/18/21   Nita Sells, MD  atorvastatin (LIPITOR) 40 MG tablet Take 40 mg by mouth every evening.    [provider]  isosorbide mononitrate (IMDUR) 30 MG 24 hr tablet Take 0.5 tablets (15 mg total) by mouth daily. 09/18/21   Nita Sells, MD  metoprolol succinate (TOPROL-XL) 50 MG 24 hr tablet Take 50 mg by mouth daily. 07/22/21   [provider]  midodrine (PROAMATINE) 10 MG tablet Take 1 tablet (10 mg total) by mouth 3 (three) times daily with meals. 09/18/21   Nita Sells, MD  pantoprazole (PROTONIX) 40 MG tablet Take 1 tablet (40 mg total) by mouth 2 (two) times daily before a meal. 09/18/21   Nita Sells, MD  sevelamer carbonate (RENVELA) 800 MG tablet Take 800 mg by mouth in the morning and at bedtime. 10/12/20   [provider]    Allergies    Patient has no known  allergies.  Review of Systems   Review of Systems  Respiratory:  Negative for shortness of breath.   Cardiovascular:  Negative for chest pain.  Gastrointestinal:  Negative for abdominal pain.  Neurological:  Negative for headaches.  All other systems reviewed and are negative.  Physical Exam Updated Vital Signs BP 125/80   Pulse 73   Temp 98.1 F (36.7 C) (Oral)   Resp 20   Ht 5\' 10"  (1.778 m)   Wt 94.3 kg   SpO2 100%   BMI 29.84 kg/m   Physical Exam Vitals and nursing note reviewed.  Constitutional:      Appearance: He is well-developed.  HENT:     Head: Normocephalic and atraumatic.     Mouth/Throat:     Mouth: Mucous membranes are moist.     Pharynx: Oropharynx is clear.  Eyes:     Pupils: Pupils are equal, round,  and reactive to light.  Cardiovascular:     Rate and Rhythm: Normal rate.  Pulmonary:     Effort: Pulmonary effort is normal. No respiratory distress.  Abdominal:     General: There is no distension.  Musculoskeletal:        General: Normal range of motion.     Cervical back: Normal range of motion.  Skin:    General: Skin is warm and dry.     Findings: Erythema (to his left heel, ttp, warm) present.  Neurological:     General: No focal deficit present.     Mental Status: He is alert.    ED Results / Procedures / Treatments   Labs (all labs ordered are listed, but only abnormal results are displayed) Labs Reviewed - No data to display  EKG None  Radiology No results found.  Procedures Procedures   Medications Ordered in ED Medications  HYDROmorphone (DILAUDID) injection 1 mg (1 mg Intramuscular Given 09/23/21 0006)  cephALEXin (KEFLEX) capsule 1,000 mg (1,000 mg Oral Given 09/23/21 0006)    ED Course  I have reviewed the triage vital signs and the nursing notes.  Pertinent labs & imaging results that were available during my care of the patient were reviewed by me and considered in my medical decision making (see chart for  details).    MDM Rules/Calculators/A&P                         Suspect cellulitis as likely cause. Considered gout vs calciphylaxis vs bone spur/fracture but with exam findings and relatively abrupt onset, I feel infection is most likely. Will initiate antibiotics, continue warm water soaks, elevation, decreased weight bearing. FU PCP in 3-4 days for recheck.  Final Clinical Impression(s) / ED Diagnoses Final diagnoses:  Cellulitis of left lower extremity    Rx / DC Orders ED Discharge Orders          Ordered    oxyCODONE-acetaminophen (PERCOCET/ROXICET) 5-325 MG tablet  Every 6 hours PRN        09/22/21 2349    cephALEXin (KEFLEX) 500 MG capsule  4 times daily        09/22/21 2350             Manley Fason, Corene Cornea, MD 09/23/21 3474

## 2021-09-24 MED FILL — Oxycodone w/ Acetaminophen Tab 5-325 MG: ORAL | Qty: 6 | Status: AC

## 2021-09-30 NOTE — Progress Notes (Addendum)
Cardiology Office Note  Date: 10/01/2021   ID: Shaun Burns, DOB 1971/07/21, MRN [id number]  PCP:  Briant Sites, PA-C  Cardiologist:  None Electrophysiologist:  None   Chief Complaint: Hospital follow-up NSTEMI  History of Present Illness: Shaun Burns is a 50 y.o. male with a history of CAD status post CABG 2019 (LIMA-LAD-D1, SVG-PDA), ESRD on hemodialysis, HTN, GERD, ischemic cardiomyopathy, Biotronik ICD secondary to history of sustained VT, DM2.  Recent admission on 09/12/2021 for chest pain, possible upper GI bleed.  He presented from dialysis center on 09/12/2021 with ongoing chest pain duration of 1 week.  Numbness on central anterior chest wall on presentation to ED.  Hemoglobin had decreased from 11 down to 7.6.  Had no reported melena or hematemesis.  He developed hypotension.  Troponin bumped to 600 range.  Had 1 dark stool.  Elevated troponins and ST depressions prompted transfer to [facility] on 09/14/2021 for consideration of cardiac catheterization.  He underwent EGD without bleeding source.  He had further chest pain, cardiology reevaluated and he was felt not to be a candidate for further work-up or intervention by cardiology.  NSTEMI was felt to be secondary to demand ischemia.  He was to continue Toprol-XL 50 mg daily, atorvastatin 40 mg daily, sublingual nitroglycerin as needed, Imdur was decreased from 60 down to 15 mg daily.  His hydralazine was held during admission and Cardizem was discontinued.  Aspirin was decreased to 81 mg daily from twice daily.  Hemoglobin had increased to 9.1 after receiving 1 PRBC unit.  He was to continue amiodarone 200 mg daily, Eliquis 5 mg p.o. twice daily.   He is here status post hospital discharge.  He denies any issues since discharge.  States he is doing well.  He continues to take antibiotics for infection in his left foot per his statement.  He had dialysis this morning.  He states that he drew some blood this morning.  He  denies any anginal symptoms, shortness of breath, DOE, PND, orthopnea, bleeding.  Denies any CVA or TIA-like symptoms, orthostatic symptoms, palpitations or arrhythmias.  Denies any claudication-like symptoms, DVT or PE-like symptoms, or lower extremity edema.  Blood pressure well controlled today at 118/70.  Heart rate of 64.  Weight is 205.    Past Medical History:  Diagnosis Date   CAD (coronary artery disease)    a. s/p CABG in 2019 with LIMA-LAD-D1 and SVG-PDA b. cath 01/2021 [facility] showing CTO mLAD unable to be crossed with wire, diffusely diseased small circ, LIMA-D2-LAD patent, SVG-RCA occluded, and CTO RCA with L-->R collaterals and medical management was recommended   ESRD on hemodialysis [facility])    Essential hypertension    GERD (gastroesophageal reflux disease)    Headache    History of GI bleed    Ischemic cardiomyopathy    Biotronik single chamber ICD (VDD lead) - February 2022 [facility]   Sustained VT (ventricular tachycardia)    February 2022   Type 2 diabetes mellitus [facility])     Past Surgical History:  Procedure Laterality Date   A/V FISTULAGRAM N/A 08/13/2018   Procedure: A/V FISTULAGRAM;  Surgeon: Marty Heck, MD;  Location: [place] [facility];  Service: Cardiovascular;  Laterality: N/A;   A/V FISTULAGRAM N/A 01/25/2020   Procedure: A/V FISTULAGRAM - Left Arm;  Surgeon: Serafina Mitchell, MD;  Location: [place] CV [facility];  Service: Cardiovascular;  Laterality: N/A;   AV FISTULA PLACEMENT Left 03/30/2015   Procedure: LEFT BRACHIOCEPHALIC ARTERIOVENOUS (AV)  FISTULA CREATION;  Surgeon: Elam Dutch, MD;  Location: [place];  Service: Vascular;  Laterality: Left;   CORONARY ARTERY BYPASS GRAFT  2019   ESOPHAGOGASTRODUODENOSCOPY (EGD) WITH PROPOFOL N/A 09/16/2021   Procedure: ESOPHAGOGASTRODUODENOSCOPY (EGD) WITH PROPOFOL;  Surgeon: Sharyn Creamer, MD;  Location: [place];  Service: Gastroenterology;  Laterality: N/A;   KNEE SURGERY     PERIPHERAL VASCULAR BALLOON  ANGIOPLASTY Left 08/13/2018   Procedure: PERIPHERAL VASCULAR BALLOON ANGIOPLASTY;  Surgeon: Marty Heck, MD;  Location: [place] [facility];  Service: Cardiovascular;  Laterality: Left;  central venous    PERIPHERAL VASCULAR BALLOON ANGIOPLASTY Left 01/25/2020   Procedure: PERIPHERAL VASCULAR BALLOON ANGIOPLASTY;  Surgeon: Serafina Mitchell, MD;  Location: [place] CV LAB;  Service: Cardiovascular;  Laterality: Left;  arm fistula   PERIPHERAL VASCULAR CATHETERIZATION Left 01/11/2016   Procedure: A/V Shuntogram/Fistulagram;  Surgeon: Conrad Kent, MD;  Location: [place] [facility];  Service: Cardiovascular;  Laterality: Left;    Current Outpatient Medications  Medication Sig Dispense Refill   acetaminophen (TYLENOL) 500 MG tablet Take 1,500 mg by mouth 2 (two) times daily as needed for moderate pain.      albuterol (VENTOLIN HFA) 108 (90 Base) MCG/ACT inhaler Inhale 2 puffs into the lungs as needed for wheezing or shortness of breath.     amiodarone (PACERONE) 200 MG tablet Take 200 mg by mouth daily.     apixaban (ELIQUIS) 5 MG TABS tablet Take 1 tablet (5 mg total) by mouth 2 (two) times daily. (Patient taking differently: Take 2.5 mg by mouth 2 (two) times daily.) 60 tablet 1   aspirin 81 MG chewable tablet Chew 1 tablet (81 mg total) by mouth daily.     atorvastatin (LIPITOR) 40 MG tablet Take 40 mg by mouth every evening.     cephALEXin (KEFLEX) 500 MG capsule Take 1 capsule (500 mg total) by mouth 4 (four) times daily. 28 capsule 0   isosorbide mononitrate (IMDUR) 30 MG 24 hr tablet Take 0.5 tablets (15 mg total) by mouth daily. 30 tablet 3   metoprolol succinate (TOPROL-XL) 50 MG 24 hr tablet Take 50 mg by mouth daily.     midodrine (PROAMATINE) 10 MG tablet Take 1 tablet (10 mg total) by mouth 3 (three) times daily with meals. 90 tablet 3   pantoprazole (PROTONIX) 40 MG tablet Take 1 tablet (40 mg total) by mouth 2 (two) times daily before a meal. 60 tablet 3   sevelamer  carbonate (RENVELA) 800 MG tablet Take 800 mg by mouth in the morning and at bedtime.     oxyCODONE-acetaminophen (PERCOCET/ROXICET) 5-325 MG tablet Take 1 tablet by mouth every 6 (six) hours as needed for severe pain. (Patient not taking: Reported on 10/01/2021) 6 tablet 0   No current facility-administered medications for this visit.   Allergies:  Patient has no known allergies.   Social History: The patient  reports that he has been smoking cigarettes. He has been smoking an average of .5 packs per day. He has never used smokeless tobacco. He reports that he does not drink alcohol and does not use drugs.   Family History: The patient's family history includes Diabetes in his mother; Hypertension in his father; Stroke in his father.   ROS:  Please see the history of present illness. Otherwise, complete review of systems is positive for none.  All other systems are reviewed and negative.   Physical Exam: VS:  BP 118/70   Pulse 64  Ht 5\' 10"  (1.778 m)   Wt 205 lb 3.2 oz (93.1 kg)   SpO2 99%   BMI 29.44 kg/m , BMI Body mass index is 29.44 kg/m.  Wt Readings from Last 3 Encounters:  10/01/21 205 lb 3.2 oz (93.1 kg)  09/22/21 208 lb (94.3 kg)  09/17/21 208 lb 5.4 oz (94.5 kg)    General: Patient appears comfortable at rest. Neck: Supple, no elevated JVP or carotid bruits, no thyromegaly. Lungs: Clear to auscultation, nonlabored breathing at rest. Cardiac: Regular rate and rhythm, no S3 or significant systolic murmur, no pericardial rub. Extremities: No pitting edema, distal pulses 2+. Skin: Warm and dry.  Functioning AV fistula left upper extremity Musculoskeletal: No kyphosis. Neuropsychiatric: Alert and oriented x3, affect grossly appropriate.  ECG:  EKG September 16, 2021 atrial fibrillation with a rate of 92, possible anterior infarct age undetermined, ST and T wave abnormality consider lateral ischemia.  Recent Labwork: 10/16/2020: B Natriuretic Peptide 1,898.0 09/13/2021:  Magnesium 1.8 09/18/2021: ALT 15; AST 13; BUN 27; Creatinine, Ser 7.57; Hemoglobin 8.7; Platelets 148; Potassium 4.4; Sodium 134     Component Value Date/Time   CHOL 89 09/13/2021 0433   TRIG 21 09/13/2021 0433   HDL 39 (L) 09/13/2021 0433   CHOLHDL 2.3 09/13/2021 0433   VLDL 4 09/13/2021 0433   LDLCALC 46 09/13/2021 0433    Other Studies Reviewed Today:  Echocardiogram 09/13/2021  1. Left ventricular ejection fraction, by estimation, is 50 to 55%. The  left ventricle has low normal function. The left ventricle demonstrates  regional wall motion abnormalities (see scoring diagram/findings for  description). There is moderate  concentric left ventricular hypertrophy. Left ventricular diastolic  parameters are indeterminate. Elevated left ventricular end-diastolic  pressure.   2. Right ventricular systolic function is normal. The right ventricular  size is normal. There is mildly elevated pulmonary artery systolic  pressure. The estimated right ventricular systolic pressure is 64.3 mmHg.   3. Left atrial size was severely dilated.   4. The mitral valve is abnormal. Moderate mitral valve regurgitation.  Moderate mitral annular calcification.   5. Tricuspid valve regurgitation is moderate.   6. The aortic valve is tricuspid, calcified with restricted motion of the  left coronary cusp. Aortic valve regurgitation is not visualized. Mild  aortic valve sclerosis is present, with no evidence of aortic valve  stenosis. Aortic valve mean gradient  measures 6.7 mmHg.   7. The inferior vena cava is dilated in size with >50% respiratory  variability, suggesting right atrial pressure of 8 mmHg.   Comparison(s): Prior images reviewed side by side. LVEF similar range.     CARDIAC CATHETERIZATION REPORT   Date of Procedure: 02/12/2021 [facility]  ___________________________________________________________________________  _  Findings:  1. Severe diffuse 3v CAD.  2. Occluded RCA with  right-to-left and left-to-right collaterals that fill  the distal RCA.  3. Occluded SVG to RCA.    4. Patent LIMA-D2-LAD with a 99% CTO lesion in the mid LAD after the LIMA  anastomosis  5. Diffuse CAD of a small LCx artery.  6. Unable to pass a wire past the mid LAD 99% stenosis, which has the  characteristics of a CTO.     Recommendations:   Medical management of CAD   Discuss whether CTO PCI to LAD is warranted given the risk of needing to  perform this PCI through the LIMA            Assessment and Plan:  1. NSTEMI (non-ST elevated  myocardial infarction) ([place])   2. Coronary artery disease involving coronary bypass graft of native heart without angina pectoris   3. Chronic atrial fibrillation ([place])   4. Anemia in chronic kidney disease, on chronic dialysis ([place])   5. Tobacco abuse   6. Essential hypertension    1. NSTEMI (non-ST elevated myocardial infarction) ([place]) Recent NSTEMI thought to be due to demand ischemia secondary to significant anemia.  Initial hemoglobin was 7.6 which later improved after PRBC transfusion to 9.1.  No further anginal symptoms.  Get follow-up CBC and BMP  2. Coronary artery disease involving coronary bypass graft of native heart without angina pectoris Cardiac catheterization [facility] on 02/12/2021 severe three-vessel CAD, occluded RCA with right to left and left to right collaterals that fill the distal RCA.  Occluded SVG to RCA.  Patent LIMA-D2-LAD with a 99% CTO lesion in mid LAD after LIMA anastomosis.  Diffuse CAD small LCx artery.  Unable to pass wire past the mid LAD 99% stenosis which has characteristics of a CTO.  Medical management was recommended for CAD.  Discussed whether CTO PCI to the LAD was warranted given risk of needing to perform this PCI through the LIMA.  Continue aspirin 81 mg daily.  Continue Imdur 15 mg p.o. daily.  Continue Toprol-XL 50 mg daily, continue atorvastatin 40 mg daily.  3. Chronic atrial fibrillation  (HCC) Rate controlled at 64 bpm today.  No bleeding on Eliquis.  Continue amiodarone 200 mg daily, Eliquis 2.5 mg p.o. twice daily.  Continue Toprol-XL 50 mg daily.  4. Anemia in chronic kidney disease, on chronic dialysis (HCC) Recent anemia during hospital stay with hemoglobin down to 7.6.  Improved with PRBC transfusion to 9.1.  We will get a follow-up CBC and basic metabolic panel.  5. Tobacco abuse Encouraged cessation.  6. Essential hypertension Blood pressure well controlled today at 118/70.  Continue Toprol XL 50 mg p.o. daily  Medication Adjustments/Labs and Tests Ordered: Current medicines are reviewed at length with the patient today.  Concerns regarding medicines are outlined above.   Disposition: Follow-up with Dr. Harl Bowie or APP 6 months  Signed, Levell July, NP 10/01/2021 10:33 AM    [facility] [place], [place], [place] [address] Phone: [phone]; Fax: [phone]

## 2021-10-01 ENCOUNTER — Other Ambulatory Visit: Payer: Self-pay

## 2021-10-01 ENCOUNTER — Encounter: Payer: Self-pay | Admitting: Family Medicine

## 2021-10-01 ENCOUNTER — Ambulatory Visit (INDEPENDENT_AMBULATORY_CARE_PROVIDER_SITE_OTHER): Payer: Medicare Other | Admitting: Family Medicine

## 2021-10-01 VITALS — BP 118/70 | HR 64 | Ht 70.0 in | Wt 205.2 lb

## 2021-10-01 DIAGNOSIS — Z992 Dependence on renal dialysis: Secondary | ICD-10-CM

## 2021-10-01 DIAGNOSIS — I25810 Atherosclerosis of coronary artery bypass graft(s) without angina pectoris: Secondary | ICD-10-CM

## 2021-10-01 DIAGNOSIS — I214 Non-ST elevation (NSTEMI) myocardial infarction: Secondary | ICD-10-CM | POA: Diagnosis not present

## 2021-10-01 DIAGNOSIS — I482 Chronic atrial fibrillation, unspecified: Secondary | ICD-10-CM | POA: Diagnosis not present

## 2021-10-01 DIAGNOSIS — D631 Anemia in chronic kidney disease: Secondary | ICD-10-CM

## 2021-10-01 DIAGNOSIS — Z720 Tobacco use: Secondary | ICD-10-CM

## 2021-10-01 DIAGNOSIS — I10 Essential (primary) hypertension: Secondary | ICD-10-CM

## 2021-10-01 DIAGNOSIS — Z79899 Other long term (current) drug therapy: Secondary | ICD-10-CM

## 2021-10-01 DIAGNOSIS — N186 End stage renal disease: Secondary | ICD-10-CM | POA: Diagnosis not present

## 2021-10-01 NOTE — Patient Instructions (Signed)
Medication Instructions:  Your physician recommends that you continue on your current medications as directed. Please refer to the Current Medication list given to you today.  Labwork: BMET & CBC as soon as possible-can by done at [facility]  Testing/Procedures: none  Follow-Up: Your physician recommends that you schedule a follow-up appointment in: 6 months  Any Other Special Instructions Will Be Listed Below (If Applicable).  If you need a refill on your cardiac medications before your next appointment, please call your pharmacy.

## 2021-10-03 ENCOUNTER — Other Ambulatory Visit: Payer: Self-pay

## 2021-10-03 ENCOUNTER — Emergency Department (HOSPITAL_COMMUNITY): Payer: Medicare Other

## 2021-10-03 ENCOUNTER — Emergency Department (HOSPITAL_COMMUNITY)
Admission: EM | Admit: 2021-10-03 | Discharge: 2021-10-03 | Disposition: A | Discharge status: Home / Self Care | Payer: Medicare Other | Attending: Emergency Medicine | Admitting: Emergency Medicine

## 2021-10-03 ENCOUNTER — Encounter (HOSPITAL_COMMUNITY): Payer: Self-pay

## 2021-10-03 DIAGNOSIS — N186 End stage renal disease: Secondary | ICD-10-CM | POA: Diagnosis not present

## 2021-10-03 DIAGNOSIS — F17210 Nicotine dependence, cigarettes, uncomplicated: Secondary | ICD-10-CM | POA: Insufficient documentation

## 2021-10-03 DIAGNOSIS — Z79899 Other long term (current) drug therapy: Secondary | ICD-10-CM | POA: Diagnosis not present

## 2021-10-03 DIAGNOSIS — I2510 Atherosclerotic heart disease of native coronary artery without angina pectoris: Secondary | ICD-10-CM | POA: Insufficient documentation

## 2021-10-03 DIAGNOSIS — I4891 Unspecified atrial fibrillation: Secondary | ICD-10-CM | POA: Insufficient documentation

## 2021-10-03 DIAGNOSIS — Z951 Presence of aortocoronary bypass graft: Secondary | ICD-10-CM | POA: Diagnosis not present

## 2021-10-03 DIAGNOSIS — R0789 Other chest pain: Secondary | ICD-10-CM | POA: Insufficient documentation

## 2021-10-03 DIAGNOSIS — I120 Hypertensive chronic kidney disease with stage 5 chronic kidney disease or end stage renal disease: Secondary | ICD-10-CM | POA: Insufficient documentation

## 2021-10-03 DIAGNOSIS — E1122 Type 2 diabetes mellitus with diabetic chronic kidney disease: Secondary | ICD-10-CM | POA: Insufficient documentation

## 2021-10-03 DIAGNOSIS — Z7901 Long term (current) use of anticoagulants: Secondary | ICD-10-CM | POA: Diagnosis not present

## 2021-10-03 DIAGNOSIS — R079 Chest pain, unspecified: Secondary | ICD-10-CM | POA: Diagnosis present

## 2021-10-03 DIAGNOSIS — Z7982 Long term (current) use of aspirin: Secondary | ICD-10-CM | POA: Insufficient documentation

## 2021-10-03 DIAGNOSIS — R072 Precordial pain: Secondary | ICD-10-CM

## 2021-10-03 DIAGNOSIS — Z992 Dependence on renal dialysis: Secondary | ICD-10-CM | POA: Diagnosis not present

## 2021-10-03 DIAGNOSIS — R112 Nausea with vomiting, unspecified: Secondary | ICD-10-CM

## 2021-10-03 LAB — CBC
HCT: 32.8 % — ABNORMAL LOW (ref 39.0–52.0)
Hemoglobin: 10.3 g/dL — ABNORMAL LOW (ref 13.0–17.0)
MCH: 30 pg (ref 26.0–34.0)
MCHC: 31.4 g/dL (ref 30.0–36.0)
MCV: 95.6 fL (ref 80.0–100.0)
Platelets: 257 10*3/uL (ref 150–400)
RBC: 3.43 MIL/uL — ABNORMAL LOW (ref 4.22–5.81)
RDW: 17.7 % — ABNORMAL HIGH (ref 11.5–15.5)
WBC: 10.6 10*3/uL — ABNORMAL HIGH (ref 4.0–10.5)
nRBC: 0 % (ref 0.0–0.2)

## 2021-10-03 LAB — BASIC METABOLIC PANEL
Anion gap: 14 (ref 5–15)
BUN: 59 mg/dL — ABNORMAL HIGH (ref 6–20)
CO2: 27 mmol/L (ref 22–32)
Calcium: 9.1 mg/dL (ref 8.9–10.3)
Chloride: 95 mmol/L — ABNORMAL LOW (ref 98–111)
Creatinine, Ser: 13.34 mg/dL — ABNORMAL HIGH (ref 0.61–1.24)
GFR, Estimated: 4 mL/min — ABNORMAL LOW (ref >60)
Glucose, Bld: 139 mg/dL — ABNORMAL HIGH (ref 70–99)
Potassium: 5.3 mmol/L — ABNORMAL HIGH (ref 3.5–5.1)
Sodium: 136 mmol/L (ref 135–145)

## 2021-10-03 LAB — TROPONIN I (HIGH SENSITIVITY): Troponin I (High Sensitivity): 18 ng/L — ABNORMAL HIGH (ref <18)

## 2021-10-03 MED ORDER — ALUM & MAG HYDROXIDE-SIMETH 200-200-20 MG/5ML PO SUSP
15.0000 mL | Freq: Once | ORAL | Status: AC
  Administered 2021-10-03: 15 mL via ORAL
  Filled 2021-10-03: qty 30

## 2021-10-03 MED ORDER — ACETAMINOPHEN 500 MG PO TABS
1000.0000 mg | ORAL_TABLET | Freq: Once | ORAL | Status: AC
  Administered 2021-10-03: 1000 mg via ORAL
  Filled 2021-10-03: qty 2

## 2021-10-03 MED ORDER — FAMOTIDINE 20 MG PO TABS
20.0000 mg | ORAL_TABLET | Freq: Once | ORAL | Status: AC
  Administered 2021-10-03: 20 mg via ORAL
  Filled 2021-10-03: qty 1

## 2021-10-03 MED ORDER — ONDANSETRON HCL 4 MG/2ML IJ SOLN
4.0000 mg | Freq: Once | INTRAMUSCULAR | Status: AC
  Administered 2021-10-03: 4 mg via INTRAVENOUS
  Filled 2021-10-03: qty 2

## 2021-10-03 NOTE — Discharge Instructions (Addendum)
It was our pleasure to provide your ER care today - we hope that you feel better.  Drink plenty of fluids/stay well hydrated.   Contact your dialysis center now/this AM, to arrange for dialysis time  there today.   Stop antibiotic as prior cellulitis appears completely resolved.   If nausea/symptoms persists, call your doctor/cardiologist today to discuss symptoms and your amiodarone therapy, as that medicine can also cause nausea/vomiting/gi symptoms. Also follow up closely with your cardiologist regarding your recent chest discomfort.   Return to ER right away if worse, new symptoms, recurrent or persistent chest pain, increased trouble breathing, persistent vomiting, abdominal pain, or other concern.

## 2021-10-03 NOTE — ED Notes (Signed)
Pt given water and crackers  

## 2021-10-03 NOTE — ED Provider Notes (Signed)
[facility] EMERGENCY DEPARTMENT Provider Note   CSN: [id number] Arrival date & time: 10/03/21  0762     History Chief Complaint  Patient presents with   Chest Pain    Shaun Burns is a 50 y.o. male.  Patient with hx esrd/hd, cad, presents indicating left chest pain since last night - symptoms acute onset, at rest, constant, mild, not pleuritic, non radiating - indicates similar to prior chest pain but not as bad. No associated sob or unusual doe. No diaphoresis. States has been having intermittent nausea/vomiting since starting in antibiotic for cellulitis left foot/ankle area. States cellulitis has resolved, but feels antibiotic and/or all his meds may be  related to the nausea/vomiting. Emesis is clear or color recently ingested food/liquids - no bloody or bilious emesis. No abd pain or distension. Having normal bms. No diarrhea. No fever or chills. No cough or uri symptoms. Recent admission for chest pain, and hx cabg and cath earlier this year - has known 3 vessel cad, not amenable to pci, and on recent admission cardiology felt candidate for med management.    The history is provided by the patient, medical records and the EMS personnel.  Chest Pain Associated symptoms: nausea and vomiting   Associated symptoms: no abdominal pain, no back pain, no cough, no fever, no headache, no palpitations and no shortness of breath       Past Medical History:  Diagnosis Date   CAD (coronary artery disease)    a. s/p CABG in 2019 with LIMA-LAD-D1 and SVG-PDA b. cath 01/2021 [facility] showing CTO mLAD unable to be crossed with wire, diffusely diseased small circ, LIMA-D2-LAD patent, SVG-RCA occluded, and CTO RCA with L-->R collaterals and medical management was recommended   ESRD on hemodialysis [facility])    Essential hypertension    GERD (gastroesophageal reflux disease)    Headache    History of GI bleed    Ischemic cardiomyopathy    Biotronik single chamber ICD (VDD lead) - February 2022 [facility]    Sustained VT (ventricular tachycardia)    February 2022   Type 2 diabetes mellitus [facility])     Patient Active Problem List   Diagnosis Date Noted   Chest pain 09/12/2021   Abnormal electrocardiogram (ECG) (EKG) 09/12/2021   Elevated troponin 09/12/2021   Anemia in chronic kidney disease (CKD) 09/12/2021   Refuses rectal examination for guiac testing 09/12/2021   NSTEMI (non-ST elevated myocardial infarction) ([employer]) 09/12/2021   Symptomatic anemia    Atrial fibrillation with RVR (HCC)    Chronic atrial fibrillation ([place]) 10/16/2020   GERD (gastroesophageal reflux disease)    Noncompliance with medication regimen    Hypertension    Type 2 diabetes mellitus (HCC)    Tobacco abuse    S/P CABG (coronary artery bypass graft)    CAD (coronary artery disease)    ESRD on dialysis ([place]) 01/04/2016    Past Surgical History:  Procedure Laterality Date   A/V FISTULAGRAM N/A 08/13/2018   Procedure: A/V FISTULAGRAM;  Surgeon: Marty Heck, MD;  Location: [place] CV LAB;  Service: Cardiovascular;  Laterality: N/A;   A/V FISTULAGRAM N/A 01/25/2020   Procedure: A/V FISTULAGRAM - Left Arm;  Surgeon: Serafina Mitchell, MD;  Location: [place] CV LAB;  Service: Cardiovascular;  Laterality: N/A;   AV FISTULA PLACEMENT Left 03/30/2015   Procedure: LEFT BRACHIOCEPHALIC ARTERIOVENOUS (AV) FISTULA CREATION;  Surgeon: Elam Dutch, MD;  Location: [place];  Service: Vascular;  Laterality: Left;   CORONARY ARTERY BYPASS  GRAFT  2019   ESOPHAGOGASTRODUODENOSCOPY (EGD) WITH PROPOFOL N/A 09/16/2021   Procedure: ESOPHAGOGASTRODUODENOSCOPY (EGD) WITH PROPOFOL;  Surgeon: Sharyn Creamer, MD;  Location: [place];  Service: Gastroenterology;  Laterality: N/A;   KNEE SURGERY     PERIPHERAL VASCULAR BALLOON ANGIOPLASTY Left 08/13/2018   Procedure: PERIPHERAL VASCULAR BALLOON ANGIOPLASTY;  Surgeon: Marty Heck, MD;  Location: [place] CV LAB;  Service: Cardiovascular;  Laterality: Left;   central venous    PERIPHERAL VASCULAR BALLOON ANGIOPLASTY Left 01/25/2020   Procedure: PERIPHERAL VASCULAR BALLOON ANGIOPLASTY;  Surgeon: Serafina Mitchell, MD;  Location: [place] CV LAB;  Service: Cardiovascular;  Laterality: Left;  arm fistula   PERIPHERAL VASCULAR CATHETERIZATION Left 01/11/2016   Procedure: A/V Shuntogram/Fistulagram;  Surgeon: Conrad [place] Hill, MD;  Location: [place] CV LAB;  Service: Cardiovascular;  Laterality: Left;       Family History  Problem Relation Age of Onset   Hypertension Father    Stroke Father    Diabetes Mother     Social History   Tobacco Use   Smoking status: Every Day    Packs/day: 0.50    Types: Cigarettes   Smokeless tobacco: Never  Substance Use Topics   Alcohol use: No    Alcohol/week: 0.0 standard drinks   Drug use: No    Home Medications Prior to Admission medications   Medication Sig Start Date End Date Taking? Authorizing Provider  acetaminophen (TYLENOL) 500 MG tablet Take 1,500 mg by mouth 2 (two) times daily as needed for moderate pain.     [provider]  albuterol (VENTOLIN HFA) 108 (90 Base) MCG/ACT inhaler Inhale 2 puffs into the lungs as needed for wheezing or shortness of breath.    [provider]  amiodarone (PACERONE) 200 MG tablet Take 200 mg by mouth daily.    [provider]  apixaban (ELIQUIS) 5 MG TABS tablet Take 1 tablet (5 mg total) by mouth 2 (two) times daily. Patient taking differently: Take 2.5 mg by mouth 2 (two) times daily. 09/18/21   Nita Sells, MD  aspirin 81 MG chewable tablet Chew 1 tablet (81 mg total) by mouth daily. 09/18/21   Nita Sells, MD  atorvastatin (LIPITOR) 40 MG tablet Take 40 mg by mouth every evening.    [provider]  cephALEXin (KEFLEX) 500 MG capsule Take 1 capsule (500 mg total) by mouth 4 (four) times daily. 09/22/21   Mesner, Corene Cornea, MD  isosorbide mononitrate (IMDUR) 30 MG 24 hr tablet Take 0.5 tablets (15 mg total) by  mouth daily. 09/18/21   Nita Sells, MD  metoprolol succinate (TOPROL-XL) 50 MG 24 hr tablet Take 50 mg by mouth daily. 07/22/21   [provider]  midodrine (PROAMATINE) 10 MG tablet Take 1 tablet (10 mg total) by mouth 3 (three) times daily with meals. 09/18/21   Nita Sells, MD  oxyCODONE-acetaminophen (PERCOCET/ROXICET) 5-325 MG tablet Take 1 tablet by mouth every 6 (six) hours as needed for severe pain. Patient not taking: Reported on 10/01/2021 09/22/21   Mesner, Corene Cornea, MD  pantoprazole (PROTONIX) 40 MG tablet Take 1 tablet (40 mg total) by mouth 2 (two) times daily before a meal. 09/18/21   Nita Sells, MD  sevelamer carbonate (RENVELA) 800 MG tablet Take 800 mg by mouth in the morning and at bedtime. 10/12/20   [provider]    Allergies    Patient has no known allergies.  Review of Systems   Review of Systems  Constitutional:  Negative for  chills and fever.  HENT:  Negative for sore throat.   Eyes:  Negative for redness.  Respiratory:  Negative for cough and shortness of breath.   Cardiovascular:  Positive for chest pain. Negative for palpitations and leg swelling.  Gastrointestinal:  Positive for nausea and vomiting. Negative for abdominal pain, constipation and diarrhea.  Genitourinary:  Negative for dysuria and flank pain.  Musculoskeletal:  Negative for back pain and neck pain.  Skin:  Negative for rash.  Neurological:  Negative for headaches.  Hematological:  Does not bruise/bleed easily.  Psychiatric/Behavioral:  Negative for confusion.    Physical Exam Updated Vital Signs BP (!) 169/93 (BP Location: Right Arm)   Pulse 80   Temp 98.3 F (36.8 C) (Oral)   Resp 18   Ht 1.778 m (5\' 10" )   Wt 97.5 kg   SpO2 100%   BMI 30.85 kg/m   Physical Exam Vitals and nursing note reviewed.  Constitutional:      Appearance: Normal appearance. He is well-developed.  HENT:     Head: Atraumatic.     Nose: Nose normal.      Mouth/Throat:     Mouth: Mucous membranes are moist.     Pharynx: Oropharynx is clear.  Eyes:     General: No scleral icterus.    Conjunctiva/sclera: Conjunctivae normal.  Neck:     Trachea: No tracheal deviation.  Cardiovascular:     Rate and Rhythm: Normal rate and regular rhythm.     Pulses: Normal pulses.     Heart sounds: Normal heart sounds. No murmur heard.   No friction rub. No gallop.  Pulmonary:     Effort: Pulmonary effort is normal. No accessory muscle usage or respiratory distress.     Breath sounds: Normal breath sounds.  Chest:     Chest wall: No tenderness.  Abdominal:     General: Bowel sounds are normal. There is no distension.     Palpations: Abdomen is soft. There is no mass.     Tenderness: There is no abdominal tenderness. There is no guarding or rebound.     Hernia: No hernia is present.  Genitourinary:    Comments: No cva tenderness. Musculoskeletal:        General: No swelling.     Cervical back: Normal range of motion and neck supple. No rigidity.     Comments: LLE/foot appears normal. No swelling, no cellulitis.   Skin:    General: Skin is warm and dry.     Findings: No rash.  Neurological:     Mental Status: He is alert.     Comments: Alert, speech clear.   Psychiatric:        Mood and Affect: Mood normal.    ED Results / Procedures / Treatments   Labs (all labs ordered are listed, but only abnormal results are displayed) Results for orders placed or performed during the hospital encounter of 03/47/42  Basic metabolic panel  Result Value Ref Range   Sodium 136 135 - 145 mmol/L   Potassium 5.3 (H) 3.5 - 5.1 mmol/L   Chloride 95 (L) 98 - 111 mmol/L   CO2 27 22 - 32 mmol/L   Glucose, Bld 139 (H) 70 - 99 mg/dL   BUN 59 (H) 6 - 20 mg/dL   Creatinine, Ser 13.34 (H) 0.61 - 1.24 mg/dL   Calcium 9.1 8.9 - 10.3 mg/dL   GFR, Estimated 4 (L) >60 mL/min   Anion gap 14 5 - 15  CBC  Result Value Ref Range   WBC 10.6 (H) 4.0 - 10.5 K/uL   RBC 3.43  (L) 4.22 - 5.81 MIL/uL   Hemoglobin 10.3 (L) 13.0 - 17.0 g/dL   HCT 32.8 (L) 39.0 - 52.0 %   MCV 95.6 80.0 - 100.0 fL   MCH 30.0 26.0 - 34.0 pg   MCHC 31.4 30.0 - 36.0 g/dL   RDW 17.7 (H) 11.5 - 15.5 %   Platelets 257 150 - 400 K/uL   nRBC 0.0 0.0 - 0.2 %  Troponin I (High Sensitivity)  Result Value Ref Range   Troponin I (High Sensitivity) 18 (H) <18 ng/L   DG Chest 2 View  Result Date: 09/12/2021 CLINICAL DATA:  Chest pain. EXAM: CHEST - 2 VIEW COMPARISON:  02/10/2021 FINDINGS: Two views of the chest demonstrate a right chest single lead ICD. Heart size is within normal limits. Both lungs are clear without pulmonary edema or focal airspace disease. No pleural effusions. Prior median sternotomy. IMPRESSION: 1. No acute cardiopulmonary disease. Electronically Signed   By: Markus Daft M.D.   On: 09/12/2021 09:56   ECHOCARDIOGRAM COMPLETE  Result Date: 09/13/2021    ECHOCARDIOGRAM REPORT   Patient Name:   Shaun Burns Date of Exam: 09/13/2021 Medical Rec #:  [id number]     Height:       70.0 in Accession #:    [id number]    Weight:       206.6 lb Date of Birth:  Mar 26, 1971      BSA:          2.116 m Patient Age:    14 years      BP:           101/60 mmHg Patient Gender: M             HR:           71 bpm. Exam Location:  [facility] Procedure: 2D Echo, Cardiac Doppler and Color Doppler Indications:    Chest Pain  History:        Patient has prior history of Echocardiogram examinations, most                 recent 10/17/2020. CAD and Previous Myocardial Infarction,                 Defibrillator, Abnormal ECG and Prior CABG, Arrythmias:Atrial                 Fibrillation, Signs/Symptoms:Chest Pain; Risk Factors:Tobacco                 abuse, Hypertension and Diabetes.  Sonographer:    Wenda Low Referring Phys: [place]  1. Left ventricular ejection fraction, by estimation, is 50 to 55%. The left ventricle has low normal function. The left ventricle demonstrates regional  wall motion abnormalities (see scoring diagram/findings for description). There is moderate concentric left ventricular hypertrophy. Left ventricular diastolic parameters are indeterminate. Elevated left ventricular end-diastolic pressure.  2. Right ventricular systolic function is normal. The right ventricular size is normal. There is mildly elevated pulmonary artery systolic pressure. The estimated right ventricular systolic pressure is 41.3 mmHg.  3. Left atrial size was severely dilated.  4. The mitral valve is abnormal. Moderate mitral valve regurgitation. Moderate mitral annular calcification.  5. Tricuspid valve regurgitation is moderate.  6. The aortic valve is tricuspid, calcified with restricted motion of the left coronary cusp. Aortic valve regurgitation is not visualized. Mild aortic valve sclerosis  is present, with no evidence of aortic valve stenosis. Aortic valve mean gradient measures 6.7 mmHg.  7. The inferior vena cava is dilated in size with >50% respiratory variability, suggesting right atrial pressure of 8 mmHg. Comparison(s): Prior images reviewed side by side. LVEF similar range. FINDINGS  Left Ventricle: Left ventricular ejection fraction, by estimation, is 50 to 55%. The left ventricle has low normal function. The left ventricle demonstrates regional wall motion abnormalities. The left ventricular internal cavity size was normal in size. There is moderate concentric left ventricular hypertrophy. Left ventricular diastolic parameters are indeterminate. Elevated left ventricular end-diastolic pressure.  LV Wall Scoring: The posterior wall and basal inferior segment are hypokinetic. The entire anterior wall, antero-lateral wall, entire septum, entire apex, and mid and distal inferior wall are normal. Right Ventricle: The right ventricular size is normal. No increase in right ventricular wall thickness. Right ventricular systolic function is normal. There is mildly elevated pulmonary artery  systolic pressure. The tricuspid regurgitant velocity is 2.79  m/s, and with an assumed right atrial pressure of 8 mmHg, the estimated right ventricular systolic pressure is 01.0 mmHg. Left Atrium: Left atrial size was severely dilated. Right Atrium: Right atrial size was normal in size. Pericardium: There is no evidence of pericardial effusion. Mitral Valve: The mitral valve is abnormal. Moderate mitral annular calcification. Moderate mitral valve regurgitation, with posteriorly-directed jet. MV peak gradient, 16.5 mmHg. The mean mitral valve gradient is 6.0 mmHg. Tricuspid Valve: The tricuspid valve is grossly normal. Tricuspid valve regurgitation is moderate. Aortic Valve: The aortic valve is tricuspid. Aortic valve regurgitation is not visualized. Mild aortic valve sclerosis is present, with no evidence of aortic valve stenosis. Aortic valve mean gradient measures 6.7 mmHg. Aortic valve peak gradient measures 12.1 mmHg. Aortic valve area, by VTI measures 1.76 cm. Pulmonic Valve: The pulmonic valve was grossly normal. Pulmonic valve regurgitation is trivial. Aorta: The aortic root is normal in size and structure. Venous: The inferior vena cava is dilated in size with greater than 50% respiratory variability, suggesting right atrial pressure of 8 mmHg. IAS/Shunts: No atrial level shunt detected by color flow Doppler. Additional Comments: A device lead is visualized.  LEFT VENTRICLE PLAX 2D LVIDd:         4.90 cm      Diastology LVIDs:         3.30 cm      LV e' lateral:   11.00 cm/s LV PW:         1.60 cm      LV E/e' lateral: 18.0 LV IVS:        1.50 cm LVOT diam:     2.10 cm LV SV:         57 LV SV Index:   27 LVOT Area:     3.46 cm  LV Volumes (MOD) LV vol d, MOD A2C: 116.0 ml LV vol d, MOD A4C: 130.0 ml LV vol s, MOD A2C: 67.7 ml LV vol s, MOD A4C: 66.3 ml LV SV MOD A2C:     48.3 ml LV SV MOD A4C:     130.0 ml LV SV MOD BP:      58.3 ml RIGHT VENTRICLE RV Basal diam:  4.30 cm RV Mid diam:    3.30 cm RV S  prime:     9.00 cm/s TAPSE (M-mode): 1.8 cm LEFT ATRIUM              Index       RIGHT ATRIUM  Index LA diam:        5.00 cm  2.36 cm/m  RA Area:     24.00 cm LA Vol (A2C):   154.0 ml 72.77 ml/m RA Volume:   71.40 ml  33.74 ml/m LA Vol (A4C):   120.0 ml 56.71 ml/m LA Biplane Vol: 142.0 ml 67.10 ml/m  AORTIC VALVE AV Area (Vmax):    1.64 cm AV Area (Vmean):   1.60 cm AV Area (VTI):     1.76 cm AV Vmax:           173.67 cm/s AV Vmean:          120.333 cm/s AV VTI:            0.324 m AV Peak Grad:      12.1 mmHg AV Mean Grad:      6.7 mmHg LVOT Vmax:         82.20 cm/s LVOT Vmean:        55.600 cm/s LVOT VTI:          0.165 m LVOT/AV VTI ratio: 0.51  AORTA Ao Root diam: 3.20 cm MITRAL VALVE                TRICUSPID VALVE MV Area (PHT): 2.84 cm     TR Peak grad:   31.1 mmHg MV Area VTI:   1.32 cm     TR Vmax:        279.00 cm/s MV Peak grad:  16.5 mmHg MV Mean grad:  6.0 mmHg     SHUNTS MV Vmax:       2.03 m/s     Systemic VTI:  0.16 m MV Vmean:      111.0 cm/s   Systemic Diam: 2.10 cm MV Decel Time: 267 msec MV E velocity: 198.00 cm/s Rozann Lesches MD Electronically signed by Rozann Lesches MD Signature Date/Time: 09/13/2021/12:31:34 PM    Final      EKG EKG Interpretation  Date/Time:  Wednesday October 03 2021 08:23:39 EDT Ventricular Rate:  89 PR Interval:    QRS Duration: 110 QT Interval:  417 QTC Calculation: 508 R Axis:   12 Text Interpretation: Atrial fibrillation Non-specific ST-t changes Prolonged QT interval Confirmed by Lajean Saver [id number]) on 10/03/2021 9:45:51 AM  Radiology DG Chest 2 View  Result Date: 10/03/2021 CLINICAL DATA:  Chest pain EXAM: CHEST - 2 VIEW COMPARISON:  Chest x-ray dated September 12, 2021 FINDINGS: Unchanged cardiomegaly. Status post median sternotomy. Right chest wall ICD lead is unchanged in position. Lungs are clear. No large pleural effusion or pneumothorax. IMPRESSION: No active cardiopulmonary disease. Electronically Signed   By: Yetta Glassman M.D.   On: 10/03/2021 09:34    Procedures Procedures   Medications Ordered in ED Medications  ondansetron (ZOFRAN) injection 4 mg (has no administration in time range)    ED Course  I have reviewed the triage vital signs and the nursing notes.  Pertinent labs & imaging results that were available during my care of the patient were reviewed by me and considered in my medical decision making (see chart for details).    MDM Rules/Calculators/A&P                           Iv ns. Continuous pulse ox and cardiac monitoring. Stat labs.   Zofran iv.   Reviewed nursing notes and prior charts for additional history. Recent admission reviewed - cardiology felt not candidate for further cath/pci.  Pt questions whether recent nv due to abx therapy - as cellulitis completely resolved, will d/c abx. Also  ?whether possible due to other cause, including other meds, amio therapy, etc.   Abd soft non tendern.   Labs reviewed/interpreted by me - trop 18. After symptoms for past day, prior trop 600, current trop 18 after day of chest discomfort felt not c/w ami/acs.    CXR reviewed/interpreted by me - no pna.  Gi meds provided.   On recheck of pt, no chest pain or discomfort. No sob.   No recurrent nausea/vomiting. Tolerating po.   Pt advised to contact his HD now/this AM, to arrange for dialysis today. Pt also to contact his doctor/cardiologist - if nausea/symptoms persist after d/c abx, consider whether possible side effect/med adjustment.      Final Clinical Impression(s) / ED Diagnoses Final diagnoses:  None    Rx / DC Orders ED Discharge Orders     None        Lajean Saver, MD 10/03/21 1003

## 2021-10-03 NOTE — ED Triage Notes (Signed)
BIB [facility], Chest pain since last, was given ABX for cellulitis on leg x 1 week and has had this sx since starting the abx. CP on upper left chest and tender to palpation. 324 ASA & 1 SL nitro. A&O x 4, NAD. M W F dialysis

## 2021-10-03 NOTE — ED Notes (Signed)
Patient transported to X-ray 

## 2021-12-10 ENCOUNTER — Emergency Department (HOSPITAL_COMMUNITY)
Admission: EM | Admit: 2021-12-10 | Discharge: 2021-12-10 | Disposition: A | Discharge status: Home / Self Care | Payer: Medicare Other | Attending: Emergency Medicine | Admitting: Emergency Medicine

## 2021-12-10 ENCOUNTER — Emergency Department (HOSPITAL_COMMUNITY): Payer: Medicare Other

## 2021-12-10 ENCOUNTER — Encounter (HOSPITAL_COMMUNITY): Payer: Self-pay | Admitting: Emergency Medicine

## 2021-12-10 DIAGNOSIS — E1122 Type 2 diabetes mellitus with diabetic chronic kidney disease: Secondary | ICD-10-CM | POA: Insufficient documentation

## 2021-12-10 DIAGNOSIS — I120 Hypertensive chronic kidney disease with stage 5 chronic kidney disease or end stage renal disease: Secondary | ICD-10-CM | POA: Insufficient documentation

## 2021-12-10 DIAGNOSIS — R079 Chest pain, unspecified: Secondary | ICD-10-CM | POA: Insufficient documentation

## 2021-12-10 DIAGNOSIS — R7989 Other specified abnormal findings of blood chemistry: Secondary | ICD-10-CM | POA: Diagnosis not present

## 2021-12-10 DIAGNOSIS — Z992 Dependence on renal dialysis: Secondary | ICD-10-CM | POA: Insufficient documentation

## 2021-12-10 DIAGNOSIS — N186 End stage renal disease: Secondary | ICD-10-CM | POA: Insufficient documentation

## 2021-12-10 DIAGNOSIS — I2510 Atherosclerotic heart disease of native coronary artery without angina pectoris: Secondary | ICD-10-CM | POA: Diagnosis not present

## 2021-12-10 DIAGNOSIS — Z951 Presence of aortocoronary bypass graft: Secondary | ICD-10-CM | POA: Insufficient documentation

## 2021-12-10 DIAGNOSIS — K219 Gastro-esophageal reflux disease without esophagitis: Secondary | ICD-10-CM | POA: Diagnosis not present

## 2021-12-10 DIAGNOSIS — K0889 Other specified disorders of teeth and supporting structures: Secondary | ICD-10-CM | POA: Insufficient documentation

## 2021-12-10 DIAGNOSIS — Z7982 Long term (current) use of aspirin: Secondary | ICD-10-CM | POA: Diagnosis not present

## 2021-12-10 DIAGNOSIS — F17210 Nicotine dependence, cigarettes, uncomplicated: Secondary | ICD-10-CM | POA: Diagnosis not present

## 2021-12-10 DIAGNOSIS — Z7901 Long term (current) use of anticoagulants: Secondary | ICD-10-CM | POA: Insufficient documentation

## 2021-12-10 DIAGNOSIS — Z79899 Other long term (current) drug therapy: Secondary | ICD-10-CM | POA: Diagnosis not present

## 2021-12-10 DIAGNOSIS — K047 Periapical abscess without sinus: Secondary | ICD-10-CM

## 2021-12-10 LAB — CBC
HCT: 36.6 % — ABNORMAL LOW (ref 39.0–52.0)
Hemoglobin: 11.7 g/dL — ABNORMAL LOW (ref 13.0–17.0)
MCH: 29.3 pg (ref 26.0–34.0)
MCHC: 32 g/dL (ref 30.0–36.0)
MCV: 91.7 fL (ref 80.0–100.0)
Platelets: 169 10*3/uL (ref 150–400)
RBC: 3.99 MIL/uL — ABNORMAL LOW (ref 4.22–5.81)
RDW: 18.1 % — ABNORMAL HIGH (ref 11.5–15.5)
WBC: 11.4 10*3/uL — ABNORMAL HIGH (ref 4.0–10.5)
nRBC: 0 % (ref 0.0–0.2)

## 2021-12-10 LAB — BASIC METABOLIC PANEL
Anion gap: 9 (ref 5–15)
BUN: 60 mg/dL — ABNORMAL HIGH (ref 6–20)
CO2: 23 mmol/L (ref 22–32)
Calcium: 8.8 mg/dL — ABNORMAL LOW (ref 8.9–10.3)
Chloride: 104 mmol/L (ref 98–111)
Creatinine, Ser: 11.39 mg/dL — ABNORMAL HIGH (ref 0.61–1.24)
GFR, Estimated: 5 mL/min — ABNORMAL LOW (ref >60)
Glucose, Bld: 116 mg/dL — ABNORMAL HIGH (ref 70–99)
Potassium: 4.8 mmol/L (ref 3.5–5.1)
Sodium: 136 mmol/L (ref 135–145)

## 2021-12-10 LAB — TROPONIN I (HIGH SENSITIVITY)
Troponin I (High Sensitivity): 19 ng/L — ABNORMAL HIGH (ref <18)
Troponin I (High Sensitivity): 20 ng/L — ABNORMAL HIGH (ref <18)

## 2021-12-10 MED ORDER — HYDROCODONE-ACETAMINOPHEN 5-325 MG PO TABS
1.0000 | ORAL_TABLET | Freq: Once | ORAL | Status: AC
Start: 2021-12-10 — End: 2021-12-10
  Administered 2021-12-10: 1 via ORAL
  Filled 2021-12-10: qty 1

## 2021-12-10 MED ORDER — AMOXICILLIN-POT CLAVULANATE 875-125 MG PO TABS: 1.0000 | ORAL_TABLET | Freq: Two times a day (BID) | ORAL | 0 refills | Status: DC

## 2021-12-10 MED ORDER — AMOXICILLIN-POT CLAVULANATE 875-125 MG PO TABS
1.0000 | ORAL_TABLET | Freq: Once | ORAL | Status: AC
  Administered 2021-12-10: 1 via ORAL
  Filled 2021-12-10: qty 1

## 2021-12-10 MED ORDER — HYDROCODONE-ACETAMINOPHEN 5-325 MG PO TABS: 1.0000 | ORAL_TABLET | ORAL | 0 refills | Status: DC | PRN

## 2021-12-10 NOTE — ED Triage Notes (Signed)
Pt here from dialysis center with c/o chest pain , left side , hx of mi this year , pt received asa and 1 nitro from dialysis , pain 6/out of 10 on arrival ,

## 2021-12-10 NOTE — ED Provider Notes (Signed)
[facility] EMERGENCY DEPARTMENT Provider Note   CSN: [id number] Arrival date & time: 12/10/21  0962     History No chief complaint on file.   Shaun Burns is a 50 y.o. male.  HPI He was at dialysis this morning when he developed chest pain.  Apparently he was given aspirin and nitroglycerin while there.  He was transferred here by EMS.  He also complains of tooth ache, left-sided.  He was hospitalized, 2 months ago, at which time he had NSTEMI felt to be due to demand ischemia associated with GI bleeding.  He has an ICD because of history of ventricular tachycardia.  He has had coronary artery bypass grafting in 2019.  He has a history of ischemic cardiomyopathy.  He states he has left anterior chest pain that "comes and goes," that has been present for months.  He states he has been seen for this previously.  He is concerned about a probable dental infection from a broken tooth, and does not currently have dental care.  He is trying to get his teeth removed.  He denies fever, chills, nausea, vomiting, shortness of breath, persistent chest pain or dizziness.  There are no other known active modifying factors.    Past Medical History:  Diagnosis Date   CAD (coronary artery disease)    a. s/p CABG in 2019 with LIMA-LAD-D1 and SVG-PDA b. cath 01/2021 [facility] showing CTO mLAD unable to be crossed with wire, diffusely diseased small circ, LIMA-D2-LAD patent, SVG-RCA occluded, and CTO RCA with L-->R collaterals and medical management was recommended   ESRD on hemodialysis [facility])    Essential hypertension    GERD (gastroesophageal reflux disease)    Headache    History of GI bleed    Ischemic cardiomyopathy    Biotronik single chamber ICD (VDD lead) - February 2022 [facility]   Sustained VT (ventricular tachycardia)    February 2022   Type 2 diabetes mellitus [facility])     Patient Active Problem List   Diagnosis Date Noted   Chest pain 09/12/2021   Abnormal electrocardiogram (ECG) (EKG)  09/12/2021   Elevated troponin 09/12/2021   Anemia in chronic kidney disease (CKD) 09/12/2021   Refuses rectal examination for guiac testing 09/12/2021   NSTEMI (non-ST elevated myocardial infarction) ([place]) 09/12/2021   Symptomatic anemia    Atrial fibrillation with RVR (HCC)    Chronic atrial fibrillation ([place]) 10/16/2020   GERD (gastroesophageal reflux disease)    Noncompliance with medication regimen    Hypertension    Type 2 diabetes mellitus (HCC)    Tobacco abuse    S/P CABG (coronary artery bypass graft)    CAD (coronary artery disease)    ESRD on dialysis ([place]) 01/04/2016    Past Surgical History:  Procedure Laterality Date   A/V FISTULAGRAM N/A 08/13/2018   Procedure: A/V FISTULAGRAM;  Surgeon: Marty Heck, MD;  Location: [place] CV LAB;  Service: Cardiovascular;  Laterality: N/A;   A/V FISTULAGRAM N/A 01/25/2020   Procedure: A/V FISTULAGRAM - Left Arm;  Surgeon: Serafina Mitchell, MD;  Location: [place] CV LAB;  Service: Cardiovascular;  Laterality: N/A;   AV FISTULA PLACEMENT Left 03/30/2015   Procedure: LEFT BRACHIOCEPHALIC ARTERIOVENOUS (AV) FISTULA CREATION;  Surgeon: Elam Dutch, MD;  Location: [place];  Service: Vascular;  Laterality: Left;   CORONARY ARTERY BYPASS GRAFT  2019   ESOPHAGOGASTRODUODENOSCOPY (EGD) WITH PROPOFOL N/A 09/16/2021   Procedure: ESOPHAGOGASTRODUODENOSCOPY (EGD) WITH PROPOFOL;  Surgeon: Sharyn Creamer, MD;  Location: [facility]  ENDOSCOPY;  Service: Gastroenterology;  Laterality: N/A;   KNEE SURGERY     PERIPHERAL VASCULAR BALLOON ANGIOPLASTY Left 08/13/2018   Procedure: PERIPHERAL VASCULAR BALLOON ANGIOPLASTY;  Surgeon: Marty Heck, MD;  Location: [place] CV LAB;  Service: Cardiovascular;  Laterality: Left;  central venous    PERIPHERAL VASCULAR BALLOON ANGIOPLASTY Left 01/25/2020   Procedure: PERIPHERAL VASCULAR BALLOON ANGIOPLASTY;  Surgeon: Serafina Mitchell, MD;  Location: [place] CV LAB;  Service: Cardiovascular;   Laterality: Left;  arm fistula   PERIPHERAL VASCULAR CATHETERIZATION Left 01/11/2016   Procedure: A/V Shuntogram/Fistulagram;  Surgeon: Conrad Belwood, MD;  Location: [place] CV LAB;  Service: Cardiovascular;  Laterality: Left;       Family History  Problem Relation Age of Onset   Hypertension Father    Stroke Father    Diabetes Mother     Social History   Tobacco Use   Smoking status: Every Day    Packs/day: 0.50    Types: Cigarettes   Smokeless tobacco: Never  Substance Use Topics   Alcohol use: No    Alcohol/week: 0.0 standard drinks   Drug use: No    Home Medications Prior to Admission medications   Medication Sig Start Date End Date Taking? Authorizing Provider  amoxicillin-clavulanate (AUGMENTIN) 875-125 MG tablet Take 1 tablet by mouth 2 (two) times daily. One po bid x 7 days 12/10/21  Yes Daleen Bo, MD  HYDROcodone-acetaminophen (NORCO) 5-325 MG tablet Take 1 tablet by mouth every 4 (four) hours as needed for moderate pain or severe pain. 12/10/21  Yes Daleen Bo, MD  acetaminophen (TYLENOL) 500 MG tablet Take 1,500 mg by mouth 2 (two) times daily as needed for moderate pain.     [provider]  albuterol (VENTOLIN HFA) 108 (90 Base) MCG/ACT inhaler Inhale 2 puffs into the lungs as needed for wheezing or shortness of breath.    [provider]  amiodarone (PACERONE) 200 MG tablet Take 200 mg by mouth daily.    [provider]  apixaban (ELIQUIS) 5 MG TABS tablet Take 1 tablet (5 mg total) by mouth 2 (two) times daily. Patient taking differently: Take 2.5 mg by mouth 2 (two) times daily. 09/18/21   Nita Sells, MD  aspirin 81 MG chewable tablet Chew 1 tablet (81 mg total) by mouth daily. 09/18/21   Nita Sells, MD  atorvastatin (LIPITOR) 20 MG tablet Take 20 mg by mouth daily. 09/12/21   [provider]  diltiazem (CARDIZEM CD) 240 MG 24 hr capsule Take 240 mg by mouth daily. 09/12/21   [provider]  isosorbide mononitrate (IMDUR) 30 MG 24 hr tablet Take 0.5 tablets (15 mg total) by mouth daily. 09/18/21   Nita Sells, MD  metoprolol succinate (TOPROL-XL) 50 MG 24 hr tablet Take 50 mg by mouth daily. 07/22/21   [provider]  midodrine (PROAMATINE) 10 MG tablet Take 1 tablet (10 mg total) by mouth 3 (three) times daily with meals. 09/18/21   Nita Sells, MD  oxyCODONE-acetaminophen (PERCOCET/ROXICET) 5-325 MG tablet Take 1 tablet by mouth every 6 (six) hours as needed for severe pain. Patient not taking: No sig reported 09/22/21   Mesner, Corene Cornea, MD  pantoprazole (PROTONIX) 40 MG tablet Take 1 tablet (40 mg total) by mouth 2 (two) times daily before a meal. 09/18/21   Nita Sells, MD  sevelamer carbonate (RENVELA) 800 MG tablet Take 800 mg by mouth in the morning and at bedtime. 10/12/20   [provider]  Allergies    Patient has no known allergies.  Review of Systems   Review of Systems  All other systems reviewed and are negative.  Physical Exam Updated Vital Signs BP (!) 176/94   Pulse 89   Temp 98.8 F (37.1 C)   Resp 20   SpO2 100%   Physical Exam Vitals and nursing note reviewed.  Constitutional:      General: He is not in acute distress.    Appearance: He is well-developed. He is not ill-appearing, toxic-appearing or diaphoretic.  HENT:     Head: Normocephalic and atraumatic.     Right Ear: External ear normal.     Left Ear: External ear normal.     Mouth/Throat:     Comments: No significant trismus.  He does have mild pain on opening the mouth.  Left lower incisor, with large Khary, eroded to the base of the tooth.  There is mild localized swelling, mostly on the buccal side.  There is no palpable abscess.  No associated neck swelling or tenderness. Eyes:     Conjunctiva/sclera: Conjunctivae normal.     Pupils: Pupils are equal, round, and reactive to light.  Neck:     Trachea: Phonation normal.  Cardiovascular:      Rate and Rhythm: Normal rate and regular rhythm.     Heart sounds: Normal heart sounds.  Pulmonary:     Effort: Pulmonary effort is normal.     Breath sounds: Normal breath sounds.  Abdominal:     General: There is no distension.     Palpations: Abdomen is soft.     Tenderness: There is no abdominal tenderness.  Musculoskeletal:        General: Normal range of motion.     Cervical back: Normal range of motion and neck supple.  Skin:    General: Skin is warm and dry.  Neurological:     Mental Status: He is alert and oriented to person, place, and time.     Cranial Nerves: No cranial nerve deficit.     Sensory: No sensory deficit.     Motor: No abnormal muscle tone.     Coordination: Coordination normal.  Psychiatric:        Mood and Affect: Mood normal.        Behavior: Behavior normal.        Thought Content: Thought content normal.        Judgment: Judgment normal.    ED Results / Procedures / Treatments   Labs (all labs ordered are listed, but only abnormal results are displayed) Labs Reviewed  BASIC METABOLIC PANEL - Abnormal; Notable for the following components:      Result Value   Glucose, Bld 116 (*)    BUN 60 (*)    Creatinine, Ser 11.39 (*)    Calcium 8.8 (*)    GFR, Estimated 5 (*)    All other components within normal limits  CBC - Abnormal; Notable for the following components:   WBC 11.4 (*)    RBC 3.99 (*)    Hemoglobin 11.7 (*)    HCT 36.6 (*)    RDW 18.1 (*)    All other components within normal limits  TROPONIN I (HIGH SENSITIVITY) - Abnormal; Notable for the following components:   Troponin I (High Sensitivity) 19 (*)    All other components within normal limits  TROPONIN I (HIGH SENSITIVITY)    EKG EKG Interpretation  Date/Time:  Monday December 10 2021 08:33:21 EST Ventricular  Rate:  78 PR Interval:    QRS Duration: 112 QT Interval:  344 QTC Calculation: 392 R Axis:   -2 Text Interpretation: Atrial fibrillation Borderline low voltage,  extremity leads Consider anterior infarct Nonspecific repol abnormality, lateral leads since last tracing no significant change Confirmed by Daleen Bo [phone]) on 12/10/2021 8:58:46 AM  Radiology DG Chest 2 View  Result Date: 12/10/2021 CLINICAL DATA:  50 year old male with a history of chest pain EXAM: CHEST - 2 VIEW COMPARISON:  10/03/2021 FINDINGS: Cardiomediastinal silhouette unchanged in size and contour. No evidence of central vascular congestion. No interlobular septal thickening. Unchanged pacing device/AICD on the right chest wall via right subclavian approach. Surgical changes of median sternotomy and CABG. No pneumothorax or pleural effusion. Coarsened interstitial markings, with no confluent airspace disease. No acute displaced fracture. Degenerative changes of the spine. IMPRESSION: Negative for acute cardiopulmonary disease. Unchanged right chest wall cardiac AICD/pacer. Surgical changes of median sternotomy and CABG Electronically Signed   By: Corrie Mckusick D.O.   On: 12/10/2021 09:05    Procedures Procedures   Medications Ordered in ED Medications  amoxicillin-clavulanate (AUGMENTIN) 875-125 MG per tablet 1 tablet (1 tablet Oral Given 12/10/21 0940)  HYDROcodone-acetaminophen (NORCO/VICODIN) 5-325 MG per tablet 1 tablet (1 tablet Oral Given 12/10/21 0940)    ED Course  I have reviewed the triage vital signs and the nursing notes.  Pertinent labs & imaging results that were available during my care of the patient were reviewed by me and considered in my medical decision making (see chart for details).    MDM Rules/Calculators/A&P                            Patient Vitals for the past 24 hrs:  BP Temp Pulse Resp SpO2  12/10/21 0837 (!) 176/94 98.8 F (37.1 C) 89 20 100 %    10:14 AM Reevaluation with update and discussion. After initial assessment and treatment, an updated evaluation reveals at this time he denies chest pain.  He is comfortable.  He is calling his  dialysis center, now to reschedule his missed dialysis appointment.  Findings discussed with the patient and all questions were answered. Daleen Bo   Medical Decision Making:  This patient is presenting for evaluation of tooth ache and chest pain, which does require a range of treatment options, and is a complaint that involves a moderate risk of morbidity and mortality. The differential diagnoses include ACS, dental infection, nonspecific chest pain, complications of end-stage renal disease. I decided to review old records, and in summary middle-aged male presenting with ongoing intermittent chest pain, not changed today from prior, with concern for dental infection.  I did not require additional historical information from anyone.  Clinical Laboratory Tests Ordered, included CBC, Metabolic panel, and troponin I . Review indicates stable chronic findings with mild troponin elevation, BUN high, creatinine high, calcium low, GFR low, hemoglobin low. Radiologic Tests Ordered, included chest x-ray.  I independently Visualized: Radiographic images, which show no infiltrate or edema    Critical Interventions-clinical evaluation, laboratory testing, radiography, observation and reassessment  After These Interventions, the Patient was reevaluated and was found stable for discharge.  Patient with hemodialysis, presenting with ongoing chest pain, noncardiac like.  Considered ACS, pneumonia, acute heart failure.  Patient has incidental dental pain, likely due to localized infection from poor detention.  Doubt sepsis or metabolic instability.  No indication that he needs hospitalization or further ED intervention at this  time.  CRITICAL CARE-no Performed by: Daleen Bo  Nursing Notes Reviewed/ Care Coordinated Applicable Imaging Reviewed Interpretation of Laboratory Data incorporated into ED treatment  The patient appears reasonably screened and/or stabilized for discharge and I doubt any other  medical condition or other [facility] requiring further screening, evaluation, or treatment in the ED at this time prior to discharge.  Plan: Home Medications-continue usual; Home Treatments-rest, fluids; return here if the recommended treatment, does not improve the symptoms; Recommended follow up-PCP, as needed.  Call dialysis center, now to reschedule for tomorrow.     Final Clinical Impression(s) / ED Diagnoses Final diagnoses:  Nonspecific chest pain  Dental infection    Rx / DC Orders ED Discharge Orders          Ordered    HYDROcodone-acetaminophen (NORCO) 5-325 MG tablet  Every 4 hours PRN        12/10/21 1019    amoxicillin-clavulanate (AUGMENTIN) 875-125 MG tablet  2 times daily        12/10/21 1019             Daleen Bo, MD 12/10/21 1021

## 2021-12-10 NOTE — Discharge Instructions (Addendum)
For the discomfort of your jaw and tooth, use a heating pad or warm compress on the area every 2 hours for 30 to 45 minutes.  We sent prescriptions for antibiotic and a pain reliever to your pharmacy.  Do not drive or drink alcohol when taking the narcotic pain reliever.  Call your dialysis center, today for an appointment to be seen tomorrow for dialysis that you missed today.  Return here, if needed.

## 2021-12-10 NOTE — ED Notes (Signed)
Pt also has an abscess tooth to the left side of his jaw , wants pain meds for the tooth

## 2022-01-21 ENCOUNTER — Telehealth: Payer: Self-pay | Admitting: Cardiology

## 2022-01-21 MED ORDER — AMIODARONE HCL 200 MG PO TABS: 200.0000 mg | ORAL_TABLET | Freq: Every day | ORAL | 3 refills | Status: DC

## 2022-01-21 NOTE — Telephone Encounter (Signed)
complete

## 2022-01-21 NOTE — Telephone Encounter (Signed)
°*  STAT* If patient is at the pharmacy, call can be transferred to refill team.   1. Which medications need to be refilled? (please list name of each medication and dose if known) amiodarone (PACERONE) 200 MG tablet  2. Which pharmacy/location (including street and city if local pharmacy) is medication to be sent to? [id number] [place] [facility] [address]  3. Do they need a 30 day or 90 day supply? [place]

## 2022-04-12 ENCOUNTER — Telehealth: Payer: Self-pay | Admitting: Cardiology

## 2022-04-12 MED ORDER — ISOSORBIDE MONONITRATE ER 30 MG PO TB24: 15.0000 mg | ORAL_TABLET | Freq: Every day | ORAL | 6 refills | Status: DC

## 2022-04-12 NOTE — Telephone Encounter (Signed)
Done

## 2022-04-12 NOTE — Telephone Encounter (Signed)
?*  STAT* If patient is at the pharmacy, call can be transferred to refill team. ? ? ?1. Which medications need to be refilled? (please list name of each medication and dose if known) isosorbide mononitrate (IMDUR) 30 MG 24 hr tablet  ? ?2. Which pharmacy/location (including street and city if local pharmacy) is [facility]   [place] ? ?3. Do they need a 30 day or 90 day supply? 30 ?  ?

## 2022-04-22 ENCOUNTER — Encounter (HOSPITAL_COMMUNITY): Payer: Self-pay

## 2022-04-22 ENCOUNTER — Emergency Department (HOSPITAL_COMMUNITY)
Admission: EM | Admit: 2022-04-22 | Discharge: 2022-04-22 | Disposition: A | Discharge status: Home / Self Care | Payer: Medicare Other | Attending: Emergency Medicine | Admitting: Emergency Medicine

## 2022-04-22 ENCOUNTER — Emergency Department (HOSPITAL_COMMUNITY): Payer: Medicare Other

## 2022-04-22 ENCOUNTER — Other Ambulatory Visit: Payer: Self-pay

## 2022-04-22 DIAGNOSIS — Z992 Dependence on renal dialysis: Secondary | ICD-10-CM | POA: Diagnosis not present

## 2022-04-22 DIAGNOSIS — Z7982 Long term (current) use of aspirin: Secondary | ICD-10-CM | POA: Diagnosis not present

## 2022-04-22 DIAGNOSIS — E119 Type 2 diabetes mellitus without complications: Secondary | ICD-10-CM | POA: Insufficient documentation

## 2022-04-22 DIAGNOSIS — R072 Precordial pain: Secondary | ICD-10-CM | POA: Insufficient documentation

## 2022-04-22 DIAGNOSIS — R112 Nausea with vomiting, unspecified: Secondary | ICD-10-CM | POA: Insufficient documentation

## 2022-04-22 DIAGNOSIS — Z79899 Other long term (current) drug therapy: Secondary | ICD-10-CM | POA: Insufficient documentation

## 2022-04-22 DIAGNOSIS — Z7901 Long term (current) use of anticoagulants: Secondary | ICD-10-CM | POA: Insufficient documentation

## 2022-04-22 DIAGNOSIS — R079 Chest pain, unspecified: Secondary | ICD-10-CM | POA: Diagnosis present

## 2022-04-22 DIAGNOSIS — I120 Hypertensive chronic kidney disease with stage 5 chronic kidney disease or end stage renal disease: Secondary | ICD-10-CM | POA: Diagnosis not present

## 2022-04-22 DIAGNOSIS — N186 End stage renal disease: Secondary | ICD-10-CM | POA: Insufficient documentation

## 2022-04-22 DIAGNOSIS — I2510 Atherosclerotic heart disease of native coronary artery without angina pectoris: Secondary | ICD-10-CM | POA: Insufficient documentation

## 2022-04-22 LAB — BASIC METABOLIC PANEL
Anion gap: 14 (ref 5–15)
BUN: 54 mg/dL — ABNORMAL HIGH (ref 6–20)
CO2: 24 mmol/L (ref 22–32)
Calcium: 10.3 mg/dL (ref 8.9–10.3)
Chloride: 101 mmol/L (ref 98–111)
Creatinine, Ser: 11.7 mg/dL — ABNORMAL HIGH (ref 0.61–1.24)
GFR, Estimated: 5 mL/min — ABNORMAL LOW (ref >60)
Glucose, Bld: 119 mg/dL — ABNORMAL HIGH (ref 70–99)
Potassium: 5.3 mmol/L — ABNORMAL HIGH (ref 3.5–5.1)
Sodium: 139 mmol/L (ref 135–145)

## 2022-04-22 LAB — TROPONIN I (HIGH SENSITIVITY)
Troponin I (High Sensitivity): 29 ng/L — ABNORMAL HIGH (ref <18)
Troponin I (High Sensitivity): 33 ng/L — ABNORMAL HIGH (ref <18)

## 2022-04-22 LAB — CBC WITH DIFFERENTIAL/PLATELET
Abs Immature Granulocytes: 0.03 10*3/uL (ref 0.00–0.07)
Basophils Absolute: 0 10*3/uL (ref 0.0–0.1)
Basophils Relative: 0 %
Eosinophils Absolute: 0.3 10*3/uL (ref 0.0–0.5)
Eosinophils Relative: 3 %
HCT: 31.5 % — ABNORMAL LOW (ref 39.0–52.0)
Hemoglobin: 9.7 g/dL — ABNORMAL LOW (ref 13.0–17.0)
Immature Granulocytes: 0 %
Lymphocytes Relative: 13 %
Lymphs Abs: 1.3 10*3/uL (ref 0.7–4.0)
MCH: 27.5 pg (ref 26.0–34.0)
MCHC: 30.8 g/dL (ref 30.0–36.0)
MCV: 89.2 fL (ref 80.0–100.0)
Monocytes Absolute: 0.8 10*3/uL (ref 0.1–1.0)
Monocytes Relative: 8 %
Neutro Abs: 7.2 10*3/uL (ref 1.7–7.7)
Neutrophils Relative %: 76 %
Platelets: 174 10*3/uL (ref 150–400)
RBC: 3.53 MIL/uL — ABNORMAL LOW (ref 4.22–5.81)
RDW: 18.6 % — ABNORMAL HIGH (ref 11.5–15.5)
WBC: 9.6 10*3/uL (ref 4.0–10.5)
nRBC: 0 % (ref 0.0–0.2)

## 2022-04-22 LAB — MAGNESIUM: Magnesium: 2.4 mg/dL (ref 1.7–2.4)

## 2022-04-22 MED ORDER — ALUM & MAG HYDROXIDE-SIMETH 200-200-20 MG/5ML PO SUSP
30.0000 mL | Freq: Once | ORAL | Status: AC
  Administered 2022-04-22: 30 mL via ORAL
  Filled 2022-04-22: qty 30

## 2022-04-22 MED ORDER — ONDANSETRON HCL 4 MG/2ML IJ SOLN
4.0000 mg | Freq: Once | INTRAMUSCULAR | Status: AC
  Administered 2022-04-22: 4 mg via INTRAVENOUS
  Filled 2022-04-22: qty 2

## 2022-04-22 MED ORDER — NITROGLYCERIN 0.4 MG SL SUBL
0.4000 mg | SUBLINGUAL_TABLET | Freq: Once | SUBLINGUAL | Status: AC
Start: 2022-04-22 — End: 2022-04-22
  Administered 2022-04-22: 0.4 mg via SUBLINGUAL
  Filled 2022-04-22: qty 1

## 2022-04-22 MED ORDER — LIDOCAINE VISCOUS HCL 2 % MT SOLN
15.0000 mL | Freq: Once | OROMUCOSAL | Status: AC
  Administered 2022-04-22: 15 mL via ORAL
  Filled 2022-04-22: qty 15

## 2022-04-22 MED ORDER — ISOSORBIDE MONONITRATE ER 30 MG PO TB24: 30.0000 mg | ORAL_TABLET | Freq: Every day | ORAL | 1 refills | Status: DC

## 2022-04-22 NOTE — ED Triage Notes (Signed)
Patient via EMS due to chest pain with nausea and vomiting that started while patient was at dialysis.  ?

## 2022-04-22 NOTE — ED Provider Notes (Signed)
?[facility] ?Provider Note ? ? ?CSN: [id number] ?Arrival date & time: 04/22/22  1194 ? ?  ? ?History ? ?Chief Complaint  ?Patient presents with  ? Chest Pain  ? ? ?Shaun Burns is a 51 y.o. male. ? ?51 year old male with past medical history of ESRD on dialysis, A-fib, CAD, recent NSTEMI, diabetes, hypertension presents today for evaluation of chest pain onset this morning that radiates to his left upper extremity and is associated with N/V, but without associated lightheadedness, diaphoresis, shortness of breath, palpitations.  He states chest pain started prior to starting his dialysis session.  Reports 3 episodes of emesis this AMHe has not received his dialysis session today.  Recent dialysis session was last Friday.  Denies orthopnea, PND, or peripheral edema.  Denied any chest pain or nausea prior to this morning.  Reports compliance with Eliquis as well as all of his other medications. ? ?The history is provided by the patient. No language interpreter was used.  ? ?  ? ?Home Medications ?Prior to Admission medications   ?Medication Sig Start Date End Date Taking? Authorizing Provider  ?acetaminophen (TYLENOL) 500 MG tablet Take 1,500 mg by mouth 2 (two) times daily as needed for moderate pain.     [provider]  ?albuterol (VENTOLIN HFA) 108 (90 Base) MCG/ACT inhaler Inhale 2 puffs into the lungs as needed for wheezing or shortness of breath.    [provider]  ?amiodarone (PACERONE) 200 MG tablet Take 1 tablet (200 mg total) by mouth daily. 01/21/22   Satira Sark, MD  ?amoxicillin-clavulanate (AUGMENTIN) 875-125 MG tablet Take 1 tablet by mouth 2 (two) times daily. One po bid x 7 days 12/10/21   Daleen Bo, MD  ?apixaban (ELIQUIS) 5 MG TABS tablet Take 1 tablet (5 mg total) by mouth 2 (two) times daily. ?Patient taking differently: Take 2.5 mg by mouth 2 (two) times daily. 09/18/21   Nita Sells, MD  ?aspirin 81 MG chewable tablet Chew 1 tablet (81  mg total) by mouth daily. 09/18/21   Nita Sells, MD  ?atorvastatin (LIPITOR) 20 MG tablet Take 20 mg by mouth daily. 09/12/21   [provider]  ?diltiazem (CARDIZEM CD) 240 MG 24 hr capsule Take 240 mg by mouth daily. 09/12/21   [provider]  ?HYDROcodone-acetaminophen (NORCO) 5-325 MG tablet Take 1 tablet by mouth every 4 (four) hours as needed for moderate pain or severe pain. 12/10/21   Daleen Bo, MD  ?isosorbide mononitrate (IMDUR) 30 MG 24 hr tablet Take 0.5 tablets (15 mg total) by mouth daily. 04/12/22   Satira Sark, MD  ?metoprolol succinate (TOPROL-XL) 50 MG 24 hr tablet Take 50 mg by mouth daily. 07/22/21   [provider]  ?midodrine (PROAMATINE) 10 MG tablet Take 1 tablet (10 mg total) by mouth 3 (three) times daily with meals. 09/18/21   Nita Sells, MD  ?oxyCODONE-acetaminophen (PERCOCET/ROXICET) 5-325 MG tablet Take 1 tablet by mouth every 6 (six) hours as needed for severe pain. ?Patient not taking: No sig reported 09/22/21   Mesner, Corene Cornea, MD  ?pantoprazole (PROTONIX) 40 MG tablet Take 1 tablet (40 mg total) by mouth 2 (two) times daily before a meal. 09/18/21   Nita Sells, MD  ?sevelamer carbonate (RENVELA) 800 MG tablet Take 800 mg by mouth in the morning and at bedtime. 10/12/20   [provider]  ?   ? ?Allergies    ?Patient has no known allergies.   ? ?Review of Systems   ?  Review of Systems  ?Constitutional:  Negative for chills and fever.  ?Respiratory:  Negative for shortness of breath.   ?Cardiovascular:  Positive for chest pain. Negative for palpitations and leg swelling.  ?Gastrointestinal:  Positive for nausea. Negative for abdominal pain and vomiting.  ?Neurological:  Negative for syncope, light-headedness and headaches.  ?All other systems reviewed and are negative. ? ?Physical Exam ?Updated Vital Signs ?BP (!) 151/84 (BP Location: Right Arm)   Pulse 70   Resp (!) 26   Ht '5\' 10"'$  (1.778 m)   Wt 93 kg   SpO2 99%    BMI 29.41 kg/m?  ?Physical Exam ?Vitals and nursing note reviewed.  ?Constitutional:   ?   General: He is not in acute distress. ?   Appearance: Normal appearance. He is not ill-appearing.  ?HENT:  ?   Head: Normocephalic and atraumatic.  ?   Nose: Nose normal.  ?Eyes:  ?   General: No scleral icterus. ?   Extraocular Movements: Extraocular movements intact.  ?   Conjunctiva/sclera: Conjunctivae normal.  ?Cardiovascular:  ?   Rate and Rhythm: Normal rate and regular rhythm.  ?   Pulses: Normal pulses.  ?   Heart sounds: Normal heart sounds.  ?Pulmonary:  ?   Effort: Pulmonary effort is normal. No respiratory distress.  ?   Breath sounds: Normal breath sounds. No wheezing or rales.  ?Abdominal:  ?   General: There is no distension.  ?   Tenderness: There is no abdominal tenderness.  ?Musculoskeletal:     ?   General: Normal range of motion.  ?   Cervical back: Normal range of motion.  ?   Right lower leg: No edema.  ?   Left lower leg: No edema.  ?Skin: ?   General: Skin is warm and dry.  ?Neurological:  ?   General: No focal deficit present.  ?   Mental Status: He is alert. Mental status is at baseline.  ? ? ?ED Results / Procedures / Treatments   ?Labs ?(all labs ordered are listed, but only abnormal results are displayed) ?Labs Reviewed - No data to display ? ?EKG ?None ? ?Radiology ?No results found. ? ?Procedures ?Procedures  ? ? ?Medications Ordered in ED ?Medications - No data to display ? ?ED Course/ Medical Decision Making/ A&P ?Clinical Course as of 04/22/22 1348  ?Mon Apr 22, 2022  ?1031 Patient reports some improvement in his pain following GI cocktail and Zofran.  He still reports mild left-sided chest pain.  Worse with palpation.  Will provide 1 dose of nitro.  CBC without leukocytosis.  Hemoglobin of 9.7 which is around patient's baseline.  BMP with potassium of 5.3, creatinine of 11.7.  Troponin of 29.  We will obtain second troponin level. [AA]  ?1142 Reports improvement in pain after nitro x1.   Current pain level of 1/10.  Second troponin level still pending. [AA]  ?  ?Clinical Course User Index ?Maudie Mercury, PA-C  ? ?                        ?Medical Decision Making ?Amount and/or Complexity of Data Reviewed ?Labs: ordered. ?Radiology: ordered. ? ?Risk ?OTC drugs. ?Prescription drug management. ? ? ?Medical Decision Making / ED Course ? ? ?This patient presents to the ED for concern of chest pain, this involves an extensive number of treatment options, and is a complaint that carries with it a high risk of complications and morbidity.  The differential diagnosis includes ACS, pneumonia, PE, MSK pain, GERD ? ?MDM: ?51 year old male with past medical history of CAD, hypertension, diabetes, ESRD on HD, A-fib on Eliquis presents today for evaluation of chest pain onset just prior to his dialysis session.  He did not receive his dialysis session.  This is associated with nausea and vomiting, but without other associated symptoms.  Pain is reproducible on exam.  Will evaluate with CBC, BMP, magnesium, troponin, chest x-ray.  Will provide Zofran and GI cocktail.  He received aspirin in route with EMS. ?Second troponin 33.  Given elevated heart score patient discussed with cardiology who recommends increasing patient's home Imdur to 30 mg.  Patient is appropriate for discharge and follow-up in clinic from their standpoint.  On-call cardiology will reach out to you clinic to get his appointment scheduled.  Given patient is chest pain-free currently with relatively flat troponin I feel this is appropriate.  Plan discussed with patient who is also in agreement.  Notified patient to call his dialysis center now to get a working session scheduled.  Patient is appropriate for discharge.  Discharged in stable condition. ? ? ?Additional history obtained: ?-Additional history obtained from recent admission where he had a NSTEMI.  Confirmed patient had three-vessel CAD with an occlusive vein graft to the RCA and severe  mid LAD lesion. ?-External records from outside source obtained and reviewed including: Chart review including previous notes, labs, imaging, consultation notes ? ? ?Lab Tests: ?-I ordered, reviewed, and in

## 2022-04-22 NOTE — ED Notes (Signed)
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

## 2022-04-22 NOTE — Discharge Instructions (Signed)
Your pain improved after medications in the emergency room.  Your blood work today was reassuring.  I discussed your case with cardiology who recommended increasing your home dose of Imdur to 30 mg.  I sent the additional refill to the pharmacy for you.  Your cardiology clinic should call you in the next 2 days to get your appointment scheduled for hospital follow-up.  If you do not hear from them please give them a call to get this appointment scheduled.  If you have any worsening chest pain, shortness of breath, or other symptoms please return to the emergency room for evaluation.  Please also call your dialysis center to schedule a make-up dialysis session. ?

## 2022-05-10 ENCOUNTER — Emergency Department (HOSPITAL_COMMUNITY): Payer: Medicare Other

## 2022-05-10 ENCOUNTER — Telehealth: Payer: Self-pay | Admitting: Internal Medicine

## 2022-05-10 ENCOUNTER — Emergency Department (HOSPITAL_COMMUNITY)
Admission: EM | Admit: 2022-05-10 | Discharge: 2022-05-10 | Disposition: A | Discharge status: Home / Self Care | Payer: Medicare Other | Attending: Emergency Medicine | Admitting: Emergency Medicine

## 2022-05-10 ENCOUNTER — Encounter (HOSPITAL_COMMUNITY): Payer: Self-pay

## 2022-05-10 DIAGNOSIS — Z992 Dependence on renal dialysis: Secondary | ICD-10-CM | POA: Diagnosis not present

## 2022-05-10 DIAGNOSIS — I120 Hypertensive chronic kidney disease with stage 5 chronic kidney disease or end stage renal disease: Secondary | ICD-10-CM | POA: Diagnosis not present

## 2022-05-10 DIAGNOSIS — E1122 Type 2 diabetes mellitus with diabetic chronic kidney disease: Secondary | ICD-10-CM | POA: Diagnosis not present

## 2022-05-10 DIAGNOSIS — Z951 Presence of aortocoronary bypass graft: Secondary | ICD-10-CM | POA: Diagnosis not present

## 2022-05-10 DIAGNOSIS — Z7982 Long term (current) use of aspirin: Secondary | ICD-10-CM | POA: Insufficient documentation

## 2022-05-10 DIAGNOSIS — D631 Anemia in chronic kidney disease: Secondary | ICD-10-CM | POA: Insufficient documentation

## 2022-05-10 DIAGNOSIS — R0789 Other chest pain: Secondary | ICD-10-CM | POA: Diagnosis not present

## 2022-05-10 DIAGNOSIS — N186 End stage renal disease: Secondary | ICD-10-CM | POA: Insufficient documentation

## 2022-05-10 DIAGNOSIS — I2510 Atherosclerotic heart disease of native coronary artery without angina pectoris: Secondary | ICD-10-CM | POA: Diagnosis not present

## 2022-05-10 DIAGNOSIS — R079 Chest pain, unspecified: Secondary | ICD-10-CM | POA: Diagnosis present

## 2022-05-10 DIAGNOSIS — K922 Gastrointestinal hemorrhage, unspecified: Secondary | ICD-10-CM | POA: Diagnosis not present

## 2022-05-10 DIAGNOSIS — Z7901 Long term (current) use of anticoagulants: Secondary | ICD-10-CM | POA: Diagnosis not present

## 2022-05-10 DIAGNOSIS — Z79899 Other long term (current) drug therapy: Secondary | ICD-10-CM | POA: Insufficient documentation

## 2022-05-10 LAB — BASIC METABOLIC PANEL
Anion gap: 10 (ref 5–15)
BUN: 41 mg/dL — ABNORMAL HIGH (ref 6–20)
CO2: 27 mmol/L (ref 22–32)
Calcium: 8.9 mg/dL (ref 8.9–10.3)
Chloride: 103 mmol/L (ref 98–111)
Creatinine, Ser: 11.31 mg/dL — ABNORMAL HIGH (ref 0.61–1.24)
GFR, Estimated: 5 mL/min — ABNORMAL LOW (ref >60)
Glucose, Bld: 131 mg/dL — ABNORMAL HIGH (ref 70–99)
Potassium: 5.1 mmol/L (ref 3.5–5.1)
Sodium: 140 mmol/L (ref 135–145)

## 2022-05-10 LAB — TROPONIN I (HIGH SENSITIVITY)
Troponin I (High Sensitivity): 34 ng/L — ABNORMAL HIGH (ref <18)
Troponin I (High Sensitivity): 63 ng/L — ABNORMAL HIGH (ref <18)

## 2022-05-10 LAB — CBC
HCT: 28.3 % — ABNORMAL LOW (ref 39.0–52.0)
Hemoglobin: 8.9 g/dL — ABNORMAL LOW (ref 13.0–17.0)
MCH: 27.5 pg (ref 26.0–34.0)
MCHC: 31.4 g/dL (ref 30.0–36.0)
MCV: 87.3 fL (ref 80.0–100.0)
Platelets: 201 10*3/uL (ref 150–400)
RBC: 3.24 MIL/uL — ABNORMAL LOW (ref 4.22–5.81)
RDW: 18.7 % — ABNORMAL HIGH (ref 11.5–15.5)
WBC: 8.1 10*3/uL (ref 4.0–10.5)
nRBC: 0 % (ref 0.0–0.2)

## 2022-05-10 NOTE — ED Provider Notes (Signed)
?[facility] ?Provider Note ? ? ?CSN: [id number] ?Arrival date & time: 05/10/22  0755 ? ?  ? ?History ? ?Chief Complaint  ?Patient presents with  ? Chest Pain  ? ? ?Va Broadwell is a 51 y.o. male. ? ? ?Chest Pain ?Associated symptoms: no abdominal pain, no cough, no dizziness, no dysphagia, no fever, no headache, no nausea, no numbness, no shortness of breath, no vomiting and no weakness   ? ?  ? ?Hoang Pettingill is a 51 y.o. 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  He also has an ICD. He presents to the Emergency Department complaining of left-sided chest pain that radiates to the upper left arm.  He states this is a reoccurring problem for him and the pain is similar to chest pain he is experienced before, but what concerned him  was the pain was more intense than usual.  Intense pain began last evening.  He took aspirin and nitroglycerin this morning.  He attempted to go to his dialysis and was given nitroglycerin x 3 and aspirin by EMS.  They did not start his dialysis treatment.  He was recommended to come here for further evaluation.  On arrival, he states he is still having some mild chest pain but overall his symptoms have improved.  He described the pain as a squeezing sensation of his left chest.  He denies any neck or jaw pain, shortness of breath, fever or chills, no recent cough or illness. ? ?He is followed by Dr. Domenic Polite and has an appointment later this month. ? ?Home Medications ?Prior to Admission medications   ?Medication Sig Start Date End Date Taking? Authorizing Provider  ?acetaminophen (TYLENOL) 500 MG tablet Take 1,500 mg by mouth 2 (two) times daily as needed for moderate pain.     [provider]  ?albuterol (VENTOLIN HFA) 108 (90 Base) MCG/ACT  inhaler Inhale 2 puffs into the lungs as needed for wheezing or shortness of breath.    [provider]  ?amiodarone (PACERONE) 200 MG tablet Take 1 tablet (200 mg total) by mouth daily. 01/21/22   Satira Sark, MD  ?amoxicillin-clavulanate (AUGMENTIN) 875-125 MG tablet Take 1 tablet by mouth 2 (two) times daily. One po bid x 7 days 12/10/21   Daleen Bo, MD  ?apixaban (ELIQUIS) 5 MG TABS tablet Take 1 tablet (5 mg total) by mouth 2 (two) times daily. ?Patient taking differently: Take 2.5 mg by mouth 2 (two) times daily. 09/18/21   Nita Sells, MD  ?aspirin 81 MG chewable tablet Chew 1 tablet (81 mg total) by mouth daily. 09/18/21   Nita Sells, MD  ?atorvastatin (LIPITOR) 20 MG tablet Take 20 mg by mouth daily. 09/12/21   [provider]  ?diltiazem (CARDIZEM CD) 240 MG 24 hr capsule Take 240 mg by mouth daily. 09/12/21   [provider]  ?HYDROcodone-acetaminophen (NORCO) 5-325 MG tablet Take 1 tablet by mouth every 4 (four) hours as needed for moderate pain or severe pain. 12/10/21   Daleen Bo, MD  ?isosorbide mononitrate (IMDUR) 30 MG 24 hr tablet Take 1 tablet (30 mg total) by mouth daily. 04/22/22   Evlyn Courier, PA-C  ?metoprolol succinate (TOPROL-XL) 50 MG 24 hr tablet Take 50 mg by mouth daily. 07/22/21   [provider]  ?midodrine (PROAMATINE) 10 MG tablet Take 1 tablet (  10 mg total) by mouth 3 (three) times daily with meals. 09/18/21   Nita Sells, MD  ?oxyCODONE-acetaminophen (PERCOCET/ROXICET) 5-325 MG tablet Take 1 tablet by mouth every 6 (six) hours as needed for severe pain. ?Patient not taking: No sig reported 09/22/21   Mesner, Corene Cornea, MD  ?pantoprazole (PROTONIX) 40 MG tablet Take 1 tablet (40 mg total) by mouth 2 (two) times daily before a meal. 09/18/21   Nita Sells, MD  ?sevelamer carbonate (RENVELA) 800 MG tablet Take 800 mg by mouth in the morning and at bedtime. 10/12/20   [provider]  ?   ? ?Allergies     ?Patient has no known allergies.   ? ?Review of Systems   ?Review of Systems  ?Constitutional:  Negative for chills and fever.  ?HENT:  Negative for congestion and trouble swallowing.   ?Respiratory:  Negative for cough, chest tightness, shortness of breath and wheezing.   ?Cardiovascular:  Positive for chest pain. Negative for leg swelling.  ?Gastrointestinal:  Negative for abdominal pain, nausea and vomiting.  ?Skin:  Negative for rash.  ?Neurological:  Negative for dizziness, weakness, numbness and headaches.  ? ?Physical Exam ?Updated Vital Signs ?BP (!) 159/80   Pulse (!) 58   Temp 98.4 ?F (36.9 ?C) (Oral)   Resp 15   Ht '5\' 10"'$  (1.778 m)   Wt 93 kg   SpO2 100%   BMI 29.41 kg/m?  ?Physical Exam ?Vitals and nursing note reviewed. Exam conducted with a chaperone present.  ?Constitutional:   ?   General: He is not in acute distress. ?   Appearance: He is well-developed. He is not ill-appearing.  ?Neck:  ?   Vascular: No JVD.  ?Cardiovascular:  ?   Rate and Rhythm: Normal rate and regular rhythm.  ?   Pulses: Normal pulses.  ?   Comments: Fistula of left arm with palpable thrill ?Pulmonary:  ?   Effort: Pulmonary effort is normal. No respiratory distress.  ?   Breath sounds: Normal breath sounds.  ?Chest:  ?   Chest wall: No tenderness.  ?Abdominal:  ?   Palpations: Abdomen is soft.  ?   Tenderness: There is no abdominal tenderness.  ?Genitourinary: ?   Comments: Digital rectal exam performed by me.  Dark brown to black heme positive stools on exam, prostate feels enlarged.  No palpable rectal masses.  No abnormal rectal tone. ?Musculoskeletal:     ?   General: Normal range of motion.  ?   Cervical back: Normal range of motion.  ?   Right lower leg: No edema.  ?   Left lower leg: No edema.  ?Skin: ?   General: Skin is warm.  ?   Capillary Refill: Capillary refill takes less than 2 seconds.  ?   Findings: No erythema or rash.  ?Neurological:  ?   General: No focal deficit present.  ?   Mental Status: He is  alert.  ?   Sensory: No sensory deficit.  ?   Motor: No weakness.  ? ? ?ED Results / Procedures / Treatments   ?Labs ?(all labs ordered are listed, but only abnormal results are displayed) ?Labs Reviewed  ?BASIC METABOLIC PANEL - Abnormal; Notable for the following components:  ?    Result Value  ? Glucose, Bld 131 (*)   ? BUN 41 (*)   ? Creatinine, Ser 11.31 (*)   ? GFR, Estimated 5 (*)   ? All other components within normal limits  ?CBC -  Abnormal; Notable for the following components:  ? RBC 3.24 (*)   ? Hemoglobin 8.9 (*)   ? HCT 28.3 (*)   ? RDW 18.7 (*)   ? All other components within normal limits  ?TROPONIN I (HIGH SENSITIVITY) - Abnormal; Notable for the following components:  ? Troponin I (High Sensitivity) 34 (*)   ? All other components within normal limits  ?TROPONIN I (HIGH SENSITIVITY) - Abnormal; Notable for the following components:  ? Troponin I (High Sensitivity) 63 (*)   ? All other components within normal limits  ? ? ?EKG ?EKG Interpretation ? ?Date/Time:  Friday May 10 2022 08:05:20 EDT ?Ventricular Rate:  68 ?PR Interval:  175 ?QRS Duration: 118 ?QT Interval:  460 ?QTC Calculation: 490 ?R Axis:   -36 ?Text Interpretation: Sinus rhythm Nonspecific IVCD with LAD Consider anterior infarct Abnormal T, consider ischemia, lateral leads No significant change since last tracing Confirmed by Fredia Sorrow [phone]) on 05/10/2022 11:00:03 AM ? ?Radiology ?DG Chest 2 View ? ?Result Date: 05/10/2022 ?CLINICAL DATA:  Chest pain since last night EXAM: CHEST - 2 VIEW COMPARISON:  04/22/2022 FINDINGS: RIGHT subclavian ICD lead projects at RIGHT ventricle. Borderline enlargement of cardiac silhouette post median sternotomy and CABG. Mediastinal contours and pulmonary vascularity normal. Lungs clear. No infiltrate, pleural effusion, or pneumothorax. Atherosclerotic calcifications aorta. Osseous structures unremarkable. IMPRESSION: Post CABG and ICD. No acute abnormalities. Electronically Signed   By: Lavonia Dana M.D.   On: 05/10/2022 10:28   ? ?Procedures ?Procedures  ? ? ?Medications Ordered in ED ?Medications - No data to display ? ?ED Course/ Medical Decision Making/ A&P ?  ?                        ?[facility]

## 2022-05-10 NOTE — ED Triage Notes (Signed)
Pt c/o chest pain that started last night. Describes pain in left side of chest and radiates down left arm. He went to dialysis this morning and was sent here for continued chest pain. EMS gave 3 nitro and Aspirin 324 mg. Chest pain started at a 9, pt currently rating it at a 6.  ?

## 2022-05-10 NOTE — Discharge Instructions (Signed)
your chest pain may be related to the recent drop in your hemoglobin.  You were found to have bleeding from your rectum today.  I have spoken with gastroenterology, Dr. Abbey Chatters.  Someone from his office will contact you on Monday to arrange a follow-up appointment with him.  Please continue to take your pantoprazole twice daily.  Continue your Eliquis as directed, but stop the 81 mg aspirin.  Return to the emergency department for any new or worsening symptoms. ?

## 2022-05-10 NOTE — Telephone Encounter (Signed)
I was contacted by the ER in regards to this patient.  Can we get him an urgent appointment in our clinic with an app or myself for chest pain and rectal bleeding?  Thank you ?

## 2022-05-15 ENCOUNTER — Ambulatory Visit (INDEPENDENT_AMBULATORY_CARE_PROVIDER_SITE_OTHER): Payer: Medicare Other | Admitting: Internal Medicine

## 2022-05-15 ENCOUNTER — Encounter: Payer: Self-pay | Admitting: Internal Medicine

## 2022-05-15 VITALS — BP 110/54 | HR 67 | Temp 97.5°F | Ht 70.0 in | Wt 201.2 lb

## 2022-05-15 DIAGNOSIS — Z79899 Other long term (current) drug therapy: Secondary | ICD-10-CM

## 2022-05-15 DIAGNOSIS — Z1211 Encounter for screening for malignant neoplasm of colon: Secondary | ICD-10-CM | POA: Diagnosis not present

## 2022-05-15 DIAGNOSIS — R195 Other fecal abnormalities: Secondary | ICD-10-CM

## 2022-05-15 DIAGNOSIS — K219 Gastro-esophageal reflux disease without esophagitis: Secondary | ICD-10-CM

## 2022-05-15 DIAGNOSIS — D509 Iron deficiency anemia, unspecified: Secondary | ICD-10-CM

## 2022-05-15 DIAGNOSIS — I4891 Unspecified atrial fibrillation: Secondary | ICD-10-CM

## 2022-05-15 NOTE — Progress Notes (Signed)
? ? ?Primary Care Physician:  Briant Sites, PA-C ?Primary Gastroenterologist:  Dr. Abbey Chatters ? ?Chief Complaint  ?Patient presents with  ? New Patient (Initial Visit)  ?  ? Ulcers, pt isn't sure of bleeding when using the bathroom. ED referral  ? ? ?HPI:   ?Shaun Burns is a 51 y.o. male who presents to the clinic today as a new patient for ER follow-up visit.  Previously seen in ER for chest pain.  Patient states this happens whenever he does not have enough fluid taken off with dialysis.  Does have a history of gastric ulcers in 2018.  Updated EGD 09/16/2021 in [place] showed mild gastritis.  Otherwise unremarkable.  Currently maintained on pantoprazole twice daily.  GERD well-controlled.  Denies any dysphagia/odynophagia. ? ?No previous colonoscopy.  Does have history of iron deficiency anemia.  Following with hematology Dr. Raliegh Ip which is getting worked up further.  Also history of ESRD on hemodialysis Monday Wednesday Friday. ? ?Denies any melena hematochezia.  No unintentional weight loss.  No abdominal pain.  No family history of colorectal malignancy.  In the ER found to be Hemoccult positive. ? ?Chronically on Eliquis for atrial fibrillation.  Follows with cardiology. ? ?11/26/2017 EGD at [facility] for follow-up after gastric ulcers (op note not available BiPAP with gastric erosions, no H. pylori) ?07/22/2017 EGD with 2 gastric ulcers, 1 linear ulcer at [facility], pathology with chronic gastritis and  purulent exudate consistent with ulcer formation, H. pylori ? ?Past Medical History:  ?Diagnosis Date  ? CAD (coronary artery disease)   ? a. s/p CABG in 2019 with LIMA-LAD-D1 and SVG-PDA b. cath 01/2021 [facility] showing CTO mLAD unable to be crossed with wire, diffusely diseased small circ, LIMA-D2-LAD patent, SVG-RCA occluded, and CTO RCA with L-->R collaterals and medical management was recommended  ? ESRD on hemodialysis ([place])   ? Essential hypertension   ? GERD (gastroesophageal reflux disease)   ?  Headache   ? History of GI bleed   ? Ischemic cardiomyopathy   ? Biotronik single chamber ICD (VDD lead) - February 2022 [facility]  ? Sustained VT (ventricular tachycardia) ([place])   ? February 2022  ? Type 2 diabetes mellitus ([place])   ? ? ?Past Surgical History:  ?Procedure Laterality Date  ? A/V FISTULAGRAM N/A 08/13/2018  ? Procedure: A/V FISTULAGRAM;  Surgeon: Marty Heck, MD;  Location: [facility];  Service: Cardiovascular;  Laterality: N/A;  ? A/V FISTULAGRAM N/A 01/25/2020  ? Procedure: A/V FISTULAGRAM - Left Arm;  Surgeon: Serafina Mitchell, MD;  Location: [facility];  Service: Cardiovascular;  Laterality: N/A;  ? AV FISTULA PLACEMENT Left 03/30/2015  ? Procedure: LEFT BRACHIOCEPHALIC ARTERIOVENOUS (AV) FISTULA CREATION;  Surgeon: Elam Dutch, MD;  Location: [facility];  Service: Vascular;  Laterality: Left;  ? CORONARY ARTERY BYPASS GRAFT  2019  ? ESOPHAGOGASTRODUODENOSCOPY (EGD) WITH PROPOFOL N/A 09/16/2021  ? Procedure: ESOPHAGOGASTRODUODENOSCOPY (EGD) WITH PROPOFOL;  Surgeon: Sharyn Creamer, MD;  Location: [facility];  Service: Gastroenterology;  Laterality: N/A;  ? KNEE SURGERY    ? PERIPHERAL VASCULAR BALLOON ANGIOPLASTY Left 08/13/2018  ? Procedure: PERIPHERAL VASCULAR BALLOON ANGIOPLASTY;  Surgeon: Marty Heck, MD;  Location: [facility];  Service: Cardiovascular;  Laterality: Left;  central venous   ? PERIPHERAL VASCULAR BALLOON ANGIOPLASTY Left 01/25/2020  ? Procedure: PERIPHERAL VASCULAR BALLOON ANGIOPLASTY;  Surgeon: Serafina Mitchell, MD;  Location: [facility];  Service: Cardiovascular;  Laterality: Left;  arm fistula  ?  PERIPHERAL VASCULAR CATHETERIZATION Left 01/11/2016  ? Procedure: A/V Shuntogram/Fistulagram;  Surgeon: Conrad Robbins, MD;  Location: [facility];  Service: Cardiovascular;  Laterality: Left;  ? ? ?Current Outpatient Medications  ?Medication Sig Dispense Refill  ? acetaminophen (TYLENOL) 500 MG tablet Take 1,500 mg by mouth 2 (two)  times daily as needed for moderate pain.     ? amiodarone (PACERONE) 200 MG tablet Take 1 tablet (200 mg total) by mouth daily. 90 tablet 3  ? apixaban (ELIQUIS) 5 MG TABS tablet Take 1 tablet (5 mg total) by mouth 2 (two) times daily. (Patient taking differently: Take 2.5 mg by mouth 2 (two) times daily.) 60 tablet 1  ? aspirin 81 MG chewable tablet Chew 1 tablet (81 mg total) by mouth daily.    ? atorvastatin (LIPITOR) 20 MG tablet Take 20 mg by mouth daily.    ? diltiazem (CARDIZEM CD) 240 MG 24 hr capsule Take 240 mg by mouth daily.    ? isosorbide mononitrate (IMDUR) 30 MG 24 hr tablet Take 1 tablet (30 mg total) by mouth daily. 30 tablet 1  ? metoprolol succinate (TOPROL-XL) 50 MG 24 hr tablet Take 50 mg by mouth daily.    ? midodrine (PROAMATINE) 10 MG tablet Take 1 tablet (10 mg total) by mouth 3 (three) times daily with meals. 90 tablet 3  ? pantoprazole (PROTONIX) 40 MG tablet Take 1 tablet (40 mg total) by mouth 2 (two) times daily before a meal. 60 tablet 3  ? sevelamer carbonate (RENVELA) 800 MG tablet Take 800 mg by mouth in the morning and at bedtime.    ? albuterol (VENTOLIN HFA) 108 (90 Base) MCG/ACT inhaler Inhale 2 puffs into the lungs as needed for wheezing or shortness of breath. (Patient not taking: Reported on 05/15/2022)    ? amoxicillin-clavulanate (AUGMENTIN) 875-125 MG tablet Take 1 tablet by mouth 2 (two) times daily. One po bid x 7 days (Patient not taking: Reported on 05/15/2022) 14 tablet 0  ? HYDROcodone-acetaminophen (NORCO) 5-325 MG tablet Take 1 tablet by mouth every 4 (four) hours as needed for moderate pain or severe pain. (Patient not taking: Reported on 05/15/2022) 20 tablet 0  ? oxyCODONE-acetaminophen (PERCOCET/ROXICET) 5-325 MG tablet Take 1 tablet by mouth every 6 (six) hours as needed for severe pain. (Patient not taking: Reported on 10/01/2021) 6 tablet 0  ? ?No current facility-administered medications for this visit.  ? ? ?Allergies as of 05/15/2022  ? (No Known Allergies)   ? ? ?Family History  ?Problem Relation Age of Onset  ? Hypertension Father   ? Stroke Father   ? Diabetes Mother   ? ? ?Social History  ? ?Socioeconomic History  ? Marital status: Legally Separated  ?  Spouse name: Not on file  ? Number of children: Not on file  ? Years of education: Not on file  ? Highest education level: Not on file  ?Occupational History  ? Not on file  ?Tobacco Use  ? Smoking status: Every Day  ?  Packs/day: 0.50  ?  Types: Cigarettes  ? Smokeless tobacco: Never  ?Substance and Sexual Activity  ? Alcohol use: No  ?  Alcohol/week: 0.0 standard drinks  ? Drug use: No  ? Sexual activity: Not on file  ?Other Topics Concern  ? Not on file  ?Social History Narrative  ? Not on file  ? ?Social Determinants of Health  ? ?Financial Resource Strain: Not on file  ?Food Insecurity: Not on file  ?  Transportation Needs: Not on file  ?Physical Activity: Not on file  ?Stress: Not on file  ?Social Connections: Not on file  ?Intimate Partner Violence: Not on file  ? ? ?Subjective: ?Review of Systems  ?Constitutional:  Negative for chills and fever.  ?HENT:  Negative for congestion and hearing loss.   ?Eyes:  Negative for blurred vision and double vision.  ?Respiratory:  Negative for cough and shortness of breath.   ?Cardiovascular:  Negative for chest pain and palpitations.  ?Gastrointestinal:  Positive for heartburn. Negative for abdominal pain, blood in stool, constipation, diarrhea, melena and vomiting.  ?Genitourinary:  Negative for dysuria and urgency.  ?Musculoskeletal:  Negative for joint pain and myalgias.  ?Skin:  Negative for itching and rash.  ?Neurological:  Negative for dizziness and headaches.  ?Psychiatric/Behavioral:  Negative for depression. The patient is not nervous/anxious.    ? ? ? ?Objective: ?BP (!) 110/54   Pulse 67   Temp (!) 97.5 ?F (36.4 ?C)   Ht '5\' 10"'$  (1.778 m)   Wt 201 lb 3.2 oz (91.3 kg)   BMI 28.87 kg/m?  ?Physical Exam ?Constitutional:   ?   Appearance: Normal appearance.   ?HENT:  ?   Head: Normocephalic and atraumatic.  ?Eyes:  ?   Extraocular Movements: Extraocular movements intact.  ?   Conjunctiva/sclera: Conjunctivae normal.  ?Cardiovascular:  ?   Rate and Rhythm: Normal rate

## 2022-05-15 NOTE — H&P (View-Only) (Signed)
Primary Care Physician:  Briant Sites, PA-C Primary Gastroenterologist:  Dr. Abbey Chatters  Chief Complaint  Patient presents with   New Patient (Initial Visit)    ? Ulcers, pt isn't sure of bleeding when using the bathroom. ED referral    HPI:   Shaun Burns is a 51 y.o. male who presents to the clinic today as a new patient for ER follow-up visit.  Previously seen in ER for chest pain.  Patient states this happens whenever he does not have enough fluid taken off with dialysis.  Does have a history of gastric ulcers in 2018.  Updated EGD 09/16/2021 in [place] showed mild gastritis.  Otherwise unremarkable.  Currently maintained on pantoprazole twice daily.  GERD well-controlled.  Denies any dysphagia/odynophagia.  No previous colonoscopy.  Does have history of iron deficiency anemia.  Following with hematology Dr. Raliegh Ip which is getting worked up further.  Also history of ESRD on hemodialysis Monday Wednesday Friday.  Denies any melena hematochezia.  No unintentional weight loss.  No abdominal pain.  No family history of colorectal malignancy.  In the ER found to be Hemoccult positive.  Chronically on Eliquis for atrial fibrillation.  Follows with cardiology.  11/26/2017 EGD at [facility] for follow-up after gastric ulcers (op note not available BiPAP with gastric erosions, no H. pylori) 07/22/2017 EGD with 2 gastric ulcers, 1 linear ulcer at GE J, pathology with chronic gastritis and  purulent exudate consistent with ulcer formation, H. pylori  Past Medical History:  Diagnosis Date   CAD (coronary artery disease)    a. s/p CABG in 2019 with LIMA-LAD-D1 and SVG-PDA b. cath 01/2021 [facility] showing CTO mLAD unable to be crossed with wire, diffusely diseased small circ, LIMA-D2-LAD patent, SVG-RCA occluded, and CTO RCA with L-->R collaterals and medical management was recommended   ESRD on hemodialysis [facility])    Essential hypertension    GERD (gastroesophageal reflux disease)     Headache    History of GI bleed    Ischemic cardiomyopathy    Biotronik single chamber ICD (VDD lead) - February 2022 [facility]   Sustained VT (ventricular tachycardia) [facility])    February 2022   Type 2 diabetes mellitus [facility])     Past Surgical History:  Procedure Laterality Date   A/V FISTULAGRAM N/A 08/13/2018   Procedure: A/V FISTULAGRAM;  Surgeon: Marty Heck, MD;  Location: [place] CV LAB;  Service: Cardiovascular;  Laterality: N/A;   A/V FISTULAGRAM N/A 01/25/2020   Procedure: A/V FISTULAGRAM - Left Arm;  Surgeon: Serafina Mitchell, MD;  Location: [place] CV LAB;  Service: Cardiovascular;  Laterality: N/A;   AV FISTULA PLACEMENT Left 03/30/2015   Procedure: LEFT BRACHIOCEPHALIC ARTERIOVENOUS (AV) FISTULA CREATION;  Surgeon: Elam Dutch, MD;  Location: [place];  Service: Vascular;  Laterality: Left;   CORONARY ARTERY BYPASS GRAFT  2019   ESOPHAGOGASTRODUODENOSCOPY (EGD) WITH PROPOFOL N/A 09/16/2021   Procedure: ESOPHAGOGASTRODUODENOSCOPY (EGD) WITH PROPOFOL;  Surgeon: Sharyn Creamer, MD;  Location: [place];  Service: Gastroenterology;  Laterality: N/A;   KNEE SURGERY     PERIPHERAL VASCULAR BALLOON ANGIOPLASTY Left 08/13/2018   Procedure: PERIPHERAL VASCULAR BALLOON ANGIOPLASTY;  Surgeon: Marty Heck, MD;  Location: [place] CV LAB;  Service: Cardiovascular;  Laterality: Left;  central venous    PERIPHERAL VASCULAR BALLOON ANGIOPLASTY Left 01/25/2020   Procedure: PERIPHERAL VASCULAR BALLOON ANGIOPLASTY;  Surgeon: Serafina Mitchell, MD;  Location: [place] CV LAB;  Service: Cardiovascular;  Laterality: Left;  arm fistula  PERIPHERAL VASCULAR CATHETERIZATION Left 01/11/2016   Procedure: A/V Shuntogram/Fistulagram;  Surgeon: Conrad Sarpy, MD;  Location: [place] CV LAB;  Service: Cardiovascular;  Laterality: Left;    Current Outpatient Medications  Medication Sig Dispense Refill   acetaminophen (TYLENOL) 500 MG tablet Take 1,500 mg by mouth 2 (two)  times daily as needed for moderate pain.      amiodarone (PACERONE) 200 MG tablet Take 1 tablet (200 mg total) by mouth daily. 90 tablet 3   apixaban (ELIQUIS) 5 MG TABS tablet Take 1 tablet (5 mg total) by mouth 2 (two) times daily. (Patient taking differently: Take 2.5 mg by mouth 2 (two) times daily.) 60 tablet 1   aspirin 81 MG chewable tablet Chew 1 tablet (81 mg total) by mouth daily.     atorvastatin (LIPITOR) 20 MG tablet Take 20 mg by mouth daily.     diltiazem (CARDIZEM CD) 240 MG 24 hr capsule Take 240 mg by mouth daily.     isosorbide mononitrate (IMDUR) 30 MG 24 hr tablet Take 1 tablet (30 mg total) by mouth daily. 30 tablet 1   metoprolol succinate (TOPROL-XL) 50 MG 24 hr tablet Take 50 mg by mouth daily.     midodrine (PROAMATINE) 10 MG tablet Take 1 tablet (10 mg total) by mouth 3 (three) times daily with meals. 90 tablet 3   pantoprazole (PROTONIX) 40 MG tablet Take 1 tablet (40 mg total) by mouth 2 (two) times daily before a meal. 60 tablet 3   sevelamer carbonate (RENVELA) 800 MG tablet Take 800 mg by mouth in the morning and at bedtime.     albuterol (VENTOLIN HFA) 108 (90 Base) MCG/ACT inhaler Inhale 2 puffs into the lungs as needed for wheezing or shortness of breath. (Patient not taking: Reported on 05/15/2022)     amoxicillin-clavulanate (AUGMENTIN) 875-125 MG tablet Take 1 tablet by mouth 2 (two) times daily. One po bid x 7 days (Patient not taking: Reported on 05/15/2022) 14 tablet 0   HYDROcodone-acetaminophen (NORCO) 5-325 MG tablet Take 1 tablet by mouth every 4 (four) hours as needed for moderate pain or severe pain. (Patient not taking: Reported on 05/15/2022) 20 tablet 0   oxyCODONE-acetaminophen (PERCOCET/ROXICET) 5-325 MG tablet Take 1 tablet by mouth every 6 (six) hours as needed for severe pain. (Patient not taking: Reported on 10/01/2021) 6 tablet 0   No current facility-administered medications for this visit.    Allergies as of 05/15/2022   (No Known Allergies)     Family History  Problem Relation Age of Onset   Hypertension Father    Stroke Father    Diabetes Mother     Social History   Socioeconomic History   Marital status: Legally Separated    Spouse name: Not on file   Number of children: Not on file   Years of education: Not on file   Highest education level: Not on file  Occupational History   Not on file  Tobacco Use   Smoking status: Every Day    Packs/day: 0.50    Types: Cigarettes   Smokeless tobacco: Never  Substance and Sexual Activity   Alcohol use: No    Alcohol/week: 0.0 standard drinks   Drug use: No   Sexual activity: Not on file  Other Topics Concern   Not on file  Social History Narrative   Not on file   Social Determinants of Health   Financial Resource Strain: Not on file  Food Insecurity: Not on file  Transportation Needs: Not on file  Physical Activity: Not on file  Stress: Not on file  Social Connections: Not on file  Intimate Partner Violence: Not on file    Subjective: Review of Systems  Constitutional:  Negative for chills and fever.  HENT:  Negative for congestion and hearing loss.   Eyes:  Negative for blurred vision and double vision.  Respiratory:  Negative for cough and shortness of breath.   Cardiovascular:  Negative for chest pain and palpitations.  Gastrointestinal:  Positive for heartburn. Negative for abdominal pain, blood in stool, constipation, diarrhea, melena and vomiting.  Genitourinary:  Negative for dysuria and urgency.  Musculoskeletal:  Negative for joint pain and myalgias.  Skin:  Negative for itching and rash.  Neurological:  Negative for dizziness and headaches.  Psychiatric/Behavioral:  Negative for depression. The patient is not nervous/anxious.       Objective: BP (!) 110/54   Pulse 67   Temp (!) 97.5 F (36.4 C)   Ht '5\' 10"'$  (1.778 m)   Wt 201 lb 3.2 oz (91.3 kg)   BMI 28.87 kg/m  Physical Exam Constitutional:      Appearance: Normal appearance.   HENT:     Head: Normocephalic and atraumatic.  Eyes:     Extraocular Movements: Extraocular movements intact.     Conjunctiva/sclera: Conjunctivae normal.  Cardiovascular:     Rate and Rhythm: Normal rate and regular rhythm.  Pulmonary:     Effort: Pulmonary effort is normal.     Breath sounds: Normal breath sounds.  Abdominal:     General: Bowel sounds are normal.     Palpations: Abdomen is soft.  Musculoskeletal:        General: Normal range of motion.     Cervical back: Normal range of motion and neck supple.  Skin:    General: Skin is warm.  Neurological:     General: No focal deficit present.     Mental Status: He is alert and oriented to person, place, and time.  Psychiatric:        Mood and Affect: Mood normal.        Behavior: Behavior normal.     Assessment: *GERD-well-controlled on pantoprazole twice daily *History of gastric ulcers *Iron deficiency anemia *High risk medication use *Atrial fibrillation  Plan: GERD well-controlled on pantoprazole twice daily.  EGD within past year.  No alarm symptoms today to warrant further investigation with EGD.  Patient is adamant that his recent chest pain was due to fluid overload as he was not at his dry weight.    Continue on pantoprazole twice daily.  In regards to his iron deficiency anemia, no previous colonoscopy. Will schedule for colonoscopy today.The risks including infection, bleed, or perforation as well as benefits, limitations, alternatives and imponderables have been reviewed with the patient. Questions have been answered. All parties agreeable.  We will attempt to have this done on a Tuesday or Thursday given dialysis on Monday Wednesday Friday.  Patient chronically on Eliquis for atrial fibrillation.  Discussed holding this medication x48 hours prior to colonoscopy.  Discussed slight increased risk of cardiovascular event during this time and he understands.    05/15/2022 4:26 PM   Disclaimer: This  note was dictated with voice recognition software. Similar sounding words can inadvertently be transcribed and may not be corrected upon review.

## 2022-05-15 NOTE — Patient Instructions (Signed)
We will schedule you for colonoscopy for colon cancer screening purposes.  You will need to hold your Eliquis for 2 days prior to procedure.  We will schedule this on a Tuesday or Thursday to not interfere with your dialysis.  Further recommendations to follow. ? ? ?It was nice meeting you today. ? ?Dr. Abbey Chatters ? ?At [facility] we value your feedback. You may receive a survey about your visit today. Please share your experience as we strive to create trusting relationships with our patients to provide genuine, compassionate, quality care. ? ?We appreciate your understanding and patience as we review any laboratory studies, imaging, and other diagnostic tests that are ordered as we care for you. Our office policy is 5 business days for review of these results, and any emergent or urgent results are addressed in a timely manner for your best interest. If you do not hear from our office in 1 week, please contact us.  ? ?We also encourage the use of MyChart, which contains your medical information for your review as well. If you are not enrolled in this feature, an access code is on this after visit summary for your convenience. Thank you for allowing [place] to be involved in your care. ? ?It was great to see you today!  I hope you have a great rest of your Spring! ? ? ? ?Shaun Burns. Abbey Chatters, D.O. ?Gastroenterology and Hepatology ?[facility] ? ?

## 2022-05-16 ENCOUNTER — Telehealth: Payer: Self-pay | Admitting: *Deleted

## 2022-05-16 NOTE — Telephone Encounter (Signed)
LMOVM to call back to schedule TCS with Dr. Abbey Chatters, ASA 3, dialysis pt.

## 2022-05-17 MED ORDER — PEG 3350-KCL-NA BICARB-NACL 420 G PO SOLR: ORAL | 0 refills | Status: DC

## 2022-05-17 NOTE — Telephone Encounter (Signed)
Pt returned call. He has been scheduled for 6/8 at 7:30am. Aware will mail instructions. Rx sent to pharmacy. Also aware to hold eliquis 48 hrs prior. Also aware pre-op will be done via telephone call.

## 2022-05-24 ENCOUNTER — Encounter: Payer: Self-pay | Admitting: Cardiology

## 2022-05-24 ENCOUNTER — Ambulatory Visit (INDEPENDENT_AMBULATORY_CARE_PROVIDER_SITE_OTHER): Payer: Medicare Other | Admitting: Cardiology

## 2022-05-24 VITALS — BP 144/72 | HR 60 | Ht 70.0 in | Wt 200.8 lb

## 2022-05-24 DIAGNOSIS — Z8679 Personal history of other diseases of the circulatory system: Secondary | ICD-10-CM

## 2022-05-24 DIAGNOSIS — I4891 Unspecified atrial fibrillation: Secondary | ICD-10-CM | POA: Diagnosis not present

## 2022-05-24 DIAGNOSIS — D6869 Other thrombophilia: Secondary | ICD-10-CM | POA: Diagnosis not present

## 2022-05-24 DIAGNOSIS — I2510 Atherosclerotic heart disease of native coronary artery without angina pectoris: Secondary | ICD-10-CM | POA: Diagnosis not present

## 2022-05-24 NOTE — Patient Instructions (Addendum)
Medication Instructions:  Stop Aspirin  Continue all other medications.     Labwork: none  Testing/Procedures: none  Follow-Up: 4 months   Any Other Special Instructions Will Be Listed Below (If Applicable). You have been referred to:  device clinic   If you need a refill on your cardiac medications before your next appointment, please call your pharmacy.

## 2022-05-24 NOTE — Progress Notes (Signed)
Clinical Summary Shaun Burns is a 51 y.o.male  CAD with CABG in 2019 - Last cath in 01/2021 showed occluded SVG to RCA and severe mid LAD lesion after anastomosis of the LIMA to LAD which was not intervened upon due to technical difficulties at [facility]. - some chest pains at times when he has too much fluid on him.  -compliant with meds   2. ESRD - chronic low bp's on HD though recently has done well - he is on midodrine '10mg'$  tid.  - MWF HD  3. HTN   4. Afib  - some palpitatations at times, mild symptoms - compliant with meds  5. Anemia EGD on 09/16/21 showed erosive gastropathy with no stigmata of recent bleeding. Patient declined colonoscopy.Tranfused 1 unit during that admission - recent bleeding. Still taking eliquis.    6. History of VT now with ICD - has been on amio  7. Hypotension - on midodrine  8. GERD - followed by GI  Past Medical History:  Diagnosis Date   CAD (coronary artery disease)    a. s/p CABG in 2019 with LIMA-LAD-D1 and SVG-PDA b. cath 01/2021 [facility] showing CTO mLAD unable to be crossed with wire, diffusely diseased small circ, LIMA-D2-LAD patent, SVG-RCA occluded, and CTO RCA with L-->R collaterals and medical management was recommended   ESRD on hemodialysis [facility])    Essential hypertension    GERD (gastroesophageal reflux disease)    Headache    History of GI bleed    Ischemic cardiomyopathy    Biotronik single chamber ICD (VDD lead) - February 2022 [facility]   Sustained VT (ventricular tachycardia) [facility])    February 2022   Type 2 diabetes mellitus (HCC)      No Known Allergies   Current Outpatient Medications  Medication Sig Dispense Refill   acetaminophen (TYLENOL) 500 MG tablet Take 1,500 mg by mouth 2 (two) times daily as needed for moderate pain.      albuterol (VENTOLIN HFA) 108 (90 Base) MCG/ACT inhaler Inhale 2 puffs into the lungs as needed for wheezing or shortness of breath. (Patient not taking: Reported on 05/15/2022)      amiodarone (PACERONE) 200 MG tablet Take 1 tablet (200 mg total) by mouth daily. 90 tablet 3   amoxicillin-clavulanate (AUGMENTIN) 875-125 MG tablet Take 1 tablet by mouth 2 (two) times daily. One po bid x 7 days (Patient not taking: Reported on 05/15/2022) 14 tablet 0   apixaban (ELIQUIS) 5 MG TABS tablet Take 1 tablet (5 mg total) by mouth 2 (two) times daily. (Patient taking differently: Take 2.5 mg by mouth 2 (two) times daily.) 60 tablet 1   aspirin 81 MG chewable tablet Chew 1 tablet (81 mg total) by mouth daily.     atorvastatin (LIPITOR) 20 MG tablet Take 20 mg by mouth daily.     diltiazem (CARDIZEM CD) 240 MG 24 hr capsule Take 240 mg by mouth daily.     HYDROcodone-acetaminophen (NORCO) 5-325 MG tablet Take 1 tablet by mouth every 4 (four) hours as needed for moderate pain or severe pain. (Patient not taking: Reported on 05/15/2022) 20 tablet 0   isosorbide mononitrate (IMDUR) 30 MG 24 hr tablet Take 1 tablet (30 mg total) by mouth daily. 30 tablet 1   metoprolol succinate (TOPROL-XL) 50 MG 24 hr tablet Take 50 mg by mouth daily.     midodrine (PROAMATINE) 10 MG tablet Take 1 tablet (10 mg total) by mouth 3 (three) times daily with meals. 90 tablet  3   oxyCODONE-acetaminophen (PERCOCET/ROXICET) 5-325 MG tablet Take 1 tablet by mouth every 6 (six) hours as needed for severe pain. (Patient not taking: Reported on 10/01/2021) 6 tablet 0   pantoprazole (PROTONIX) 40 MG tablet Take 1 tablet (40 mg total) by mouth 2 (two) times daily before a meal. 60 tablet 3   polyethylene glycol-electrolytes (NULYTELY) 420 g solution As directed 4000 mL 0   sevelamer carbonate (RENVELA) 800 MG tablet Take 800 mg by mouth in the morning and at bedtime.     No current facility-administered medications for this visit.     Past Surgical History:  Procedure Laterality Date   A/V FISTULAGRAM N/A 08/13/2018   Procedure: A/V FISTULAGRAM;  Surgeon: Marty Heck, MD;  Location: [place] CV [facility];  Service:  Cardiovascular;  Laterality: N/A;   A/V FISTULAGRAM N/A 01/25/2020   Procedure: A/V FISTULAGRAM - Left Arm;  Surgeon: Serafina Mitchell, MD;  Location: [place] CV LAB;  Service: Cardiovascular;  Laterality: N/A;   AV FISTULA PLACEMENT Left 03/30/2015   Procedure: LEFT BRACHIOCEPHALIC ARTERIOVENOUS (AV) FISTULA CREATION;  Surgeon: Elam Dutch, MD;  Location: [place];  Service: Vascular;  Laterality: Left;   CORONARY ARTERY BYPASS GRAFT  2019   ESOPHAGOGASTRODUODENOSCOPY (EGD) WITH PROPOFOL N/A 09/16/2021   Procedure: ESOPHAGOGASTRODUODENOSCOPY (EGD) WITH PROPOFOL;  Surgeon: Sharyn Creamer, MD;  Location: [place];  Service: Gastroenterology;  Laterality: N/A;   KNEE SURGERY     PERIPHERAL VASCULAR BALLOON ANGIOPLASTY Left 08/13/2018   Procedure: PERIPHERAL VASCULAR BALLOON ANGIOPLASTY;  Surgeon: Marty Heck, MD;  Location: [place] CV LAB;  Service: Cardiovascular;  Laterality: Left;  central venous    PERIPHERAL VASCULAR BALLOON ANGIOPLASTY Left 01/25/2020   Procedure: PERIPHERAL VASCULAR BALLOON ANGIOPLASTY;  Surgeon: Serafina Mitchell, MD;  Location: [place] CV LAB;  Service: Cardiovascular;  Laterality: Left;  arm fistula   PERIPHERAL VASCULAR CATHETERIZATION Left 01/11/2016   Procedure: A/V Shuntogram/Fistulagram;  Surgeon: Conrad [place], MD;  Location: [place] CV LAB;  Service: Cardiovascular;  Laterality: Left;     No Known Allergies    Family History  Problem Relation Age of Onset   Hypertension Father    Stroke Father    Diabetes Mother      Social History Shaun Burns reports that he has been smoking cigarettes. He has been smoking an average of .5 packs per day. He has never used smokeless tobacco. Shaun Burns reports no history of alcohol use.   Review of Systems CONSTITUTIONAL: No weight loss, fever, chills, weakness or fatigue.  HEENT: Eyes: No visual loss, blurred vision, double vision or yellow sclerae.No hearing loss, sneezing, congestion,  runny nose or sore throat.  SKIN: No rash or itching.  CARDIOVASCULAR: per hpi RESPIRATORY: No shortness of breath, cough or sputum.  GASTROINTESTINAL: No anorexia, nausea, vomiting or diarrhea. No abdominal pain or blood.  GENITOURINARY: No burning on urination, no polyuria NEUROLOGICAL: No headache, dizziness, syncope, paralysis, ataxia, numbness or tingling in the extremities. No change in bowel or bladder control.  MUSCULOSKELETAL: No muscle, back pain, joint pain or stiffness.  LYMPHATICS: No enlarged nodes. No history of splenectomy.  PSYCHIATRIC: No history of depression or anxiety.  ENDOCRINOLOGIC: No reports of sweating, cold or heat intolerance. No polyuria or polydipsia.  Marland Kitchen   Physical Examination Today's Vitals   05/24/22 1345  BP: (!) 144/72  Pulse: 60  SpO2: 100%  Weight: 200 lb 12.8 oz (91.1 kg)  Height: '5\' 10"'$  (1.778 m)  Body mass index is 28.81 kg/m.  Gen: resting comfortably, no acute distress HEENT: no scleral icterus, pupils equal round and reactive, no palptable cervical adenopathy,  CV: RRR, no m/r/g no jvd Resp: Clear to auscultation bilaterally GI: abdomen is soft, non-tender, non-distended, normal bowel sounds, no hepatosplenomegaly MSK: extremities are warm, no edema.  Skin: warm, no rash Neuro:  no focal deficits Psych: appropriate affect   Assessment and Plan  1.CAD - cath as reported above - nonspecific chest pains he related to having too much fluid on him at times, not specifically angina - continue current meds - Unclear indication for ASA with eliquis, he is having recurrent GI bleeding, will d/c aspirin  2. Afib./acquired thormbophilia - overall controlled, continue current meds - continue eliquis for stroke prevention  3. Hx of VT - has ICD, needs to establish in our ICD clinic - continue amio - no symptoms    Arnoldo Lenis, M.D.

## 2022-06-04 ENCOUNTER — Encounter (HOSPITAL_COMMUNITY): Payer: Self-pay

## 2022-06-04 ENCOUNTER — Encounter (HOSPITAL_COMMUNITY)
Admission: RE | Admit: 2022-06-04 | Discharge: 2022-06-04 | Disposition: A | Discharge status: Home / Self Care | Payer: Medicare Other | Source: Ambulatory Visit | Attending: Internal Medicine | Admitting: Internal Medicine

## 2022-06-04 DIAGNOSIS — N186 End stage renal disease: Secondary | ICD-10-CM

## 2022-06-04 HISTORY — DX: Cardiac arrhythmia, unspecified: I49.9

## 2022-06-04 HISTORY — DX: Presence of automatic (implantable) cardiac defibrillator: Z95.810

## 2022-06-06 ENCOUNTER — Encounter (HOSPITAL_COMMUNITY)
Admission: RE | Disposition: A | Discharge status: Home / Self Care | Payer: Self-pay | Source: Ambulatory Visit | Attending: Internal Medicine

## 2022-06-06 ENCOUNTER — Encounter (HOSPITAL_COMMUNITY): Payer: Self-pay

## 2022-06-06 ENCOUNTER — Ambulatory Visit (HOSPITAL_COMMUNITY)
Admission: RE | Admit: 2022-06-06 | Discharge: 2022-06-06 | Disposition: A | Discharge status: Home / Self Care | Payer: Medicare Other | Source: Ambulatory Visit | Attending: Internal Medicine | Admitting: Internal Medicine

## 2022-06-06 ENCOUNTER — Ambulatory Visit (HOSPITAL_COMMUNITY): Payer: Medicare Other | Admitting: Certified Registered Nurse Anesthetist

## 2022-06-06 ENCOUNTER — Ambulatory Visit (HOSPITAL_BASED_OUTPATIENT_CLINIC_OR_DEPARTMENT_OTHER): Payer: Medicare Other | Admitting: Certified Registered Nurse Anesthetist

## 2022-06-06 DIAGNOSIS — I2510 Atherosclerotic heart disease of native coronary artery without angina pectoris: Secondary | ICD-10-CM | POA: Insufficient documentation

## 2022-06-06 DIAGNOSIS — K648 Other hemorrhoids: Secondary | ICD-10-CM | POA: Insufficient documentation

## 2022-06-06 DIAGNOSIS — N186 End stage renal disease: Secondary | ICD-10-CM | POA: Diagnosis not present

## 2022-06-06 DIAGNOSIS — Z7901 Long term (current) use of anticoagulants: Secondary | ICD-10-CM | POA: Insufficient documentation

## 2022-06-06 DIAGNOSIS — D509 Iron deficiency anemia, unspecified: Secondary | ICD-10-CM | POA: Insufficient documentation

## 2022-06-06 DIAGNOSIS — D122 Benign neoplasm of ascending colon: Secondary | ICD-10-CM | POA: Diagnosis not present

## 2022-06-06 DIAGNOSIS — I120 Hypertensive chronic kidney disease with stage 5 chronic kidney disease or end stage renal disease: Secondary | ICD-10-CM | POA: Insufficient documentation

## 2022-06-06 DIAGNOSIS — Z8711 Personal history of peptic ulcer disease: Secondary | ICD-10-CM | POA: Insufficient documentation

## 2022-06-06 DIAGNOSIS — I252 Old myocardial infarction: Secondary | ICD-10-CM | POA: Insufficient documentation

## 2022-06-06 DIAGNOSIS — Z992 Dependence on renal dialysis: Secondary | ICD-10-CM | POA: Diagnosis not present

## 2022-06-06 DIAGNOSIS — E1122 Type 2 diabetes mellitus with diabetic chronic kidney disease: Secondary | ICD-10-CM | POA: Insufficient documentation

## 2022-06-06 DIAGNOSIS — I4891 Unspecified atrial fibrillation: Secondary | ICD-10-CM | POA: Diagnosis not present

## 2022-06-06 DIAGNOSIS — R195 Other fecal abnormalities: Secondary | ICD-10-CM | POA: Diagnosis not present

## 2022-06-06 DIAGNOSIS — Z951 Presence of aortocoronary bypass graft: Secondary | ICD-10-CM | POA: Diagnosis not present

## 2022-06-06 DIAGNOSIS — K219 Gastro-esophageal reflux disease without esophagitis: Secondary | ICD-10-CM | POA: Diagnosis not present

## 2022-06-06 DIAGNOSIS — K635 Polyp of colon: Secondary | ICD-10-CM | POA: Diagnosis not present

## 2022-06-06 DIAGNOSIS — F17210 Nicotine dependence, cigarettes, uncomplicated: Secondary | ICD-10-CM | POA: Diagnosis not present

## 2022-06-06 DIAGNOSIS — Z79899 Other long term (current) drug therapy: Secondary | ICD-10-CM | POA: Diagnosis not present

## 2022-06-06 HISTORY — PX: POLYPECTOMY: SHX5525

## 2022-06-06 HISTORY — PX: COLONOSCOPY WITH PROPOFOL: SHX5780

## 2022-06-06 LAB — POCT I-STAT, CHEM 8
BUN: 50 mg/dL — ABNORMAL HIGH (ref 6–20)
Calcium, Ion: 1.19 mmol/L (ref 1.15–1.40)
Chloride: 111 mmol/L (ref 98–111)
Creatinine, Ser: 17.6 mg/dL — ABNORMAL HIGH (ref 0.61–1.24)
Glucose, Bld: 87 mg/dL (ref 70–99)
HCT: 32 % — ABNORMAL LOW (ref 39.0–52.0)
Hemoglobin: 10.9 g/dL — ABNORMAL LOW (ref 13.0–17.0)
Potassium: 4.9 mmol/L (ref 3.5–5.1)
Sodium: 141 mmol/L (ref 135–145)
TCO2: 22 mmol/L (ref 22–32)

## 2022-06-06 SURGERY — COLONOSCOPY WITH PROPOFOL
Anesthesia: General

## 2022-06-06 MED ORDER — PROPOFOL 10 MG/ML IV BOLUS
INTRAVENOUS | Status: DC | PRN
  Administered 2022-06-06: 50 mg via INTRAVENOUS

## 2022-06-06 MED ORDER — SODIUM CHLORIDE (PF) 0.9 % IJ SOLN
INTRAMUSCULAR | Status: AC
  Filled 2022-06-06: qty 10

## 2022-06-06 MED ORDER — SODIUM CHLORIDE 0.9 % IV SOLN: INTRAVENOUS | Status: DC | PRN

## 2022-06-06 MED ORDER — SODIUM CHLORIDE 0.9 % IV SOLN: Freq: Once | INTRAVENOUS | Status: AC

## 2022-06-06 MED ORDER — ONDANSETRON HCL 4 MG/2ML IJ SOLN
4.0000 mg | Freq: Once | INTRAMUSCULAR | Status: AC
Start: 2022-06-06 — End: 2022-06-06

## 2022-06-06 MED ORDER — PROPOFOL 500 MG/50ML IV EMUL
INTRAVENOUS | Status: DC | PRN
  Administered 2022-06-06: 75 ug/kg/min via INTRAVENOUS

## 2022-06-06 MED ORDER — PROPOFOL 500 MG/50ML IV EMUL
INTRAVENOUS | Status: AC
  Filled 2022-06-06: qty 50

## 2022-06-06 MED ORDER — EPHEDRINE 5 MG/ML INJ
INTRAVENOUS | Status: AC
Start: 2022-06-06 — End: ?
  Filled 2022-06-06: qty 5

## 2022-06-06 MED ORDER — LIDOCAINE HCL (CARDIAC) PF 100 MG/5ML IV SOSY
PREFILLED_SYRINGE | INTRAVENOUS | Status: DC | PRN
  Administered 2022-06-06: 100 mg via INTRAVENOUS

## 2022-06-06 MED ORDER — PHENYLEPHRINE HCL (PRESSORS) 10 MG/ML IV SOLN
INTRAVENOUS | Status: AC
  Filled 2022-06-06: qty 1

## 2022-06-06 MED ORDER — LIDOCAINE HCL (PF) 2 % IJ SOLN
INTRAMUSCULAR | Status: AC
  Filled 2022-06-06: qty 5

## 2022-06-06 MED ORDER — ONDANSETRON HCL 4 MG/2ML IJ SOLN
INTRAMUSCULAR | Status: AC
  Administered 2022-06-06: 4 mg via INTRAVENOUS
  Filled 2022-06-06: qty 2

## 2022-06-06 MED ORDER — ETOMIDATE 2 MG/ML IV SOLN
INTRAVENOUS | Status: AC
  Filled 2022-06-06: qty 20

## 2022-06-06 NOTE — Transfer of Care (Signed)
Immediate Anesthesia Transfer of Care Note  Patient: Shaun Burns  Procedure(s) Performed: COLONOSCOPY WITH PROPOFOL POLYPECTOMY  Patient Location: PACU  Anesthesia Type:General  Level of Consciousness: awake, alert  and oriented  Airway & Oxygen Therapy: Patient Spontanous Breathing  Post-op Assessment: Report given to RN, Post -op Vital signs reviewed and stable, Patient moving all extremities X 4 and Patient able to stick tongue midline  Post vital signs: Reviewed  Last Vitals:  Vitals Value Taken Time  BP 124/56   Temp 98.6   Pulse 69   Resp 22   SpO2 100     Last Pain:  Vitals:   06/06/22 0737  TempSrc:   PainSc: 4       Patients Stated Pain Goal: 6 (22/48/25 0037)  Complications: No notable events documented.

## 2022-06-06 NOTE — Progress Notes (Signed)
Left upper arm AV graft- post-procedure - thrill is present and palpable

## 2022-06-06 NOTE — Progress Notes (Signed)
Left upper arm AV graft - palpated  pre-procedure -thrill is present

## 2022-06-06 NOTE — Anesthesia Preprocedure Evaluation (Signed)
Anesthesia Evaluation  Patient identified by MRN, date of birth, ID band Patient awake    Reviewed: Allergy & Precautions, H&P , NPO status , Patient's Chart, lab work & pertinent test results, reviewed documented beta blocker date and time   Airway Mallampati: II  TM Distance: >3 FB Neck ROM: full    Dental no notable dental hx.    Pulmonary neg pulmonary ROS, Current Smoker,    Pulmonary exam normal breath sounds clear to auscultation       Cardiovascular Exercise Tolerance: Good hypertension, + CAD, + Past MI and + CABG  + Cardiac Defibrillator  Rhythm:regular Rate:Normal     Neuro/Psych  Headaches, negative psych ROS   GI/Hepatic Neg liver ROS, GERD  Medicated,  Endo/Other  negative endocrine ROSdiabetes, Type 2  Renal/GU ESRF and DialysisRenal disease  negative genitourinary   Musculoskeletal   Abdominal   Peds  Hematology  (+) Blood dyscrasia, anemia ,   Anesthesia Other Findings   Reproductive/Obstetrics negative OB ROS                             Anesthesia Physical Anesthesia Plan  ASA: 3  Anesthesia Plan: General   Post-op Pain Management:    Induction:   PONV Risk Score and Plan: Propofol infusion  Airway Management Planned:   Additional Equipment:   Intra-op Plan:   Post-operative Plan:   Informed Consent: I have reviewed the patients History and Physical, chart, labs and discussed the procedure including the risks, benefits and alternatives for the proposed anesthesia with the patient or authorized representative who has indicated his/her understanding and acceptance.     Dental Advisory Given  Plan Discussed with: CRNA  Anesthesia Plan Comments:         Anesthesia Quick Evaluation

## 2022-06-06 NOTE — Op Note (Signed)
[facility] Patient Name: Shaun Burns Procedure Date: 06/06/2022 7:10 AM MRN: [id number] Date of Birth: [birth year] Attending MD: Elon Alas. Edgar Frisk CSN: [id number] Age: 51 Admit Type: Outpatient Procedure:                Colonoscopy Indications:              Heme positive stool, Iron deficiency anemia Providers:                Elon Alas. Abbey Chatters, DO, Charlsie Quest. Insurance claims handler, Therapist, sports,                            Suzan Garibaldi. Risa Grill, Technician Referring MD:              Medicines:                See the Anesthesia note for documentation of the                            administered medications Complications:            No immediate complications. Estimated Blood Loss:     Estimated blood loss was minimal. Procedure:                Pre-Anesthesia Assessment:                           - The anesthesia plan was to use monitored                            anesthesia care (MAC).                           After obtaining informed consent, the colonoscope                            was passed under direct vision. Throughout the                            procedure, the patient's blood pressure, pulse, and                            oxygen saturations were monitored continuously. The                            [id number]-[id number] ([id number]) was introduced through the                            anus and advanced to the the cecum, identified by                            appendiceal orifice and ileocecal valve. The                            colonoscopy was performed without difficulty. The                            patient tolerated  the procedure well. The quality                            of the bowel preparation was evaluated using the                            BBPS [facility] Bowel Preparation Scale) with scores                            of: Right Colon = 3, Transverse Colon = 3 and Left                            Colon = 3 (entire mucosa seen well with no residual                             staining, small fragments of stool or opaque                            liquid). The total BBPS score equals 9. Scope In: 7:44:41 AM Scope Out: 8:03:07 AM Scope Withdrawal Time: 0 hours 9 minutes 20 seconds  Total Procedure Duration: 0 hours 18 minutes 26 seconds  Findings:      The perianal and digital rectal examinations were normal.      Non-bleeding internal hemorrhoids were found during endoscopy.      Two sessile polyps were found in the ascending colon. The polyps were 3       to 7 mm in size. These polyps were removed with a cold snare. Resection       and retrieval were complete.      The exam was otherwise without abnormality. Impression:               - Non-bleeding internal hemorrhoids.                           - Two 3 to 7 mm polyps in the ascending colon,                            removed with a cold snare. Resected and retrieved.                           - The examination was otherwise normal. Moderate Sedation:      Per Anesthesia Care Recommendation:           - Patient has a contact number available for                            emergencies. The signs and symptoms of potential                            delayed complications were discussed with the                            patient. Return to normal activities tomorrow.  Written discharge instructions were provided to the                            patient.                           - Resume previous diet.                           - Continue present medications.                           - Await pathology results.                           - Repeat colonoscopy in 5 years for surveillance.                           - Return to GI clinic PRN. Procedure Code(s):        --- Professional ---                           [phone], Colonoscopy, flexible; with removal of                            tumor(s), polyp(s), or other lesion(s) by snare                            technique Diagnosis  Code(s):        --- Professional ---                           K63.5, Polyp of colon                           K64.8, Other hemorrhoids                           R19.5, Other fecal abnormalities                           D50.9, Iron deficiency anemia, unspecified CPT copyright 2019 American Medical Association. All rights reserved. The codes documented in this report are preliminary and upon coder review may  be revised to meet current compliance requirements. Elon Alas. Abbey Chatters, DO [place] Abbey Chatters, DO 06/06/2022 8:05:26 AM This report has been signed electronically. Number of Addenda: 0

## 2022-06-06 NOTE — Discharge Instructions (Signed)
  Colonoscopy Discharge Instructions  Read the instructions outlined below and refer to this sheet in the next few weeks. These discharge instructions provide you with general information on caring for yourself after you leave the hospital. Your doctor may also give you specific instructions. While your treatment has been planned according to the most current medical practices available, unavoidable complications occasionally occur.   ACTIVITY You may resume your regular activity, but move at a slower pace for the next 24 hours.  Take frequent rest periods for the next 24 hours.  Walking will help get rid of the air and reduce the bloated feeling in your belly (abdomen).  No driving for 24 hours (because of the medicine (anesthesia) used during the test).   Do not sign any important legal documents or operate any machinery for 24 hours (because of the anesthesia used during the test).  NUTRITION Drink plenty of fluids.  You may resume your normal diet as instructed by your doctor.  Begin with a light meal and progress to your normal diet. Heavy or fried foods are harder to digest and may make you feel sick to your stomach (nauseated).  Avoid alcoholic beverages for 24 hours or as instructed.  MEDICATIONS You may resume your normal medications unless your doctor tells you otherwise.  WHAT YOU CAN EXPECT TODAY Some feelings of bloating in the abdomen.  Passage of more gas than usual.  Spotting of blood in your stool or on the toilet paper.  IF YOU HAD POLYPS REMOVED DURING THE COLONOSCOPY: No aspirin products for 7 days or as instructed.  No alcohol for 7 days or as instructed.  Eat a soft diet for the next 24 hours.  FINDING OUT THE RESULTS OF YOUR TEST Not all test results are available during your visit. If your test results are not back during the visit, make an appointment with your caregiver to find out the results. Do not assume everything is normal if you have not heard from your  caregiver or the medical facility. It is important for you to follow up on all of your test results.  SEEK IMMEDIATE MEDICAL ATTENTION IF: You have more than a spotting of blood in your stool.  Your belly is swollen (abdominal distention).  You are nauseated or vomiting.  You have a temperature over 101.  You have abdominal pain or discomfort that is severe or gets worse throughout the day.   Your colonoscopy revealed 2 polyp(s) which I removed successfully. Await pathology results, my office will contact you. I recommend repeating colonoscopy in 5 years for surveillance purposes. Otherwise follow up with GI as needed.    I hope you have a great rest of your week!  Bartlett Enke K. Shakya Sebring, D.O. Gastroenterology and Hepatology [facility]  

## 2022-06-06 NOTE — Anesthesia Postprocedure Evaluation (Signed)
Anesthesia Post Note  Patient: Shaun Burns  Procedure(s) Performed: COLONOSCOPY WITH PROPOFOL POLYPECTOMY  Patient location during evaluation: Phase II Anesthesia Type: General Level of consciousness: awake Pain management: pain level controlled Vital Signs Assessment: post-procedure vital signs reviewed and stable Respiratory status: spontaneous breathing and respiratory function stable Cardiovascular status: blood pressure returned to baseline and stable Postop Assessment: no headache and no apparent nausea or vomiting Anesthetic complications: no Comments: Late entry   No notable events documented.   Last Vitals:  Vitals:   06/06/22 0649 06/06/22 0809  BP: 139/62 (!) 124/56  Pulse: 71 68  Resp: 18 15  Temp: 36.8 C 36.6 C  SpO2: 100% 100%    Last Pain:  Vitals:   06/06/22 0809  TempSrc: Oral  PainSc: 0-No pain                 Louann Sjogren

## 2022-06-06 NOTE — Interval H&P Note (Signed)
History and Physical Interval Note:  06/06/2022 7:35 AM  Shaun Burns  has presented today for surgery, with the diagnosis of IDA, heme positive stool.  The various methods of treatment have been discussed with the patient and family. After consideration of risks, benefits and other options for treatment, the patient has consented to  Procedure(s) with comments: COLONOSCOPY WITH PROPOFOL (N/A) - 7:30am, dialysis patient as a surgical intervention.  The patient's history has been reviewed, patient examined, no change in status, stable for surgery.  I have reviewed the patient's chart and labs.  Questions were answered to the patient's satisfaction.     Eloise Harman

## 2022-06-07 LAB — SURGICAL PATHOLOGY

## 2022-06-13 ENCOUNTER — Encounter (HOSPITAL_COMMUNITY): Payer: Self-pay | Admitting: Internal Medicine

## 2022-07-05 ENCOUNTER — Encounter: Payer: Self-pay | Admitting: Internal Medicine

## 2022-07-05 ENCOUNTER — Ambulatory Visit (INDEPENDENT_AMBULATORY_CARE_PROVIDER_SITE_OTHER): Payer: Medicare Other | Admitting: Internal Medicine

## 2022-07-05 VITALS — BP 116/78 | HR 59 | Ht 70.0 in | Wt 201.4 lb

## 2022-07-05 DIAGNOSIS — I472 Ventricular tachycardia, unspecified: Secondary | ICD-10-CM | POA: Diagnosis not present

## 2022-07-05 DIAGNOSIS — D6869 Other thrombophilia: Secondary | ICD-10-CM | POA: Diagnosis not present

## 2022-07-05 DIAGNOSIS — I4819 Other persistent atrial fibrillation: Secondary | ICD-10-CM | POA: Diagnosis not present

## 2022-07-05 DIAGNOSIS — I2510 Atherosclerotic heart disease of native coronary artery without angina pectoris: Secondary | ICD-10-CM | POA: Diagnosis not present

## 2022-07-05 NOTE — Patient Instructions (Signed)
Medication Instructions:  Continue all current medications.  Labwork: none  Testing/Procedures: none  Follow-Up: 1 year - Dr.  Allred   Any Other Special Instructions Will Be Listed Below (If Applicable).   If you need a refill on your cardiac medications before your next appointment, please call your pharmacy.  

## 2022-07-05 NOTE — Progress Notes (Signed)
Briant Sites, PA-C: Primary Cardiologist:  Dr Cletis [place] Kinsel is a 51 y.o. male with a h/o CAD, ischemic CM, ESRD on HD, and prior VT sp ICD (BIotronik) at [facility] 01/2021 who presents today to establish care in the Electrophysiology device clinic.   The patient reports doing reasonably well since having an ICD implanted.  He has chronic comorbidities.  Today, he  denies symptoms of palpitations, chest pain, shortness of breath, orthopnea, PND, lower extremity edema, dizziness, presyncope, syncope, or neurologic sequela.  The patientis tolerating medications without difficulties and is otherwise without complaint today.   Past Medical History:  Diagnosis Date   AICD (automatic cardioverter/defibrillator) present    CAD (coronary artery disease)    a. s/p CABG in 2019 with LIMA-LAD-D1 and SVG-PDA b. cath 01/2021 [facility] showing CTO mLAD unable to be crossed with wire, diffusely diseased small circ, LIMA-D2-LAD patent, SVG-RCA occluded, and CTO RCA with L-->R collaterals and medical management was recommended   Dysrhythmia    ESRD on hemodialysis [facility])    Essential hypertension    GERD (gastroesophageal reflux disease)    Headache    History of GI bleed    Ischemic cardiomyopathy    Biotronik single chamber ICD (VDD lead) - February 2022 [facility]   Myocardial infarction [facility]) 01/2021   Sustained VT (ventricular tachycardia) ([place])    February 2022   Type 2 diabetes mellitus [facility])    Past Surgical History:  Procedure Laterality Date   A/V FISTULAGRAM N/A 08/13/2018   Procedure: A/V FISTULAGRAM;  Surgeon: Marty Heck, MD;  Location: [place] CV LAB;  Service: Cardiovascular;  Laterality: N/A;   A/V FISTULAGRAM N/A 01/25/2020   Procedure: A/V FISTULAGRAM - Left Arm;  Surgeon: Serafina Mitchell, MD;  Location: [place] CV LAB;  Service: Cardiovascular;  Laterality: N/A;   AV FISTULA PLACEMENT Left 03/30/2015   Procedure: LEFT BRACHIOCEPHALIC ARTERIOVENOUS (AV)  FISTULA CREATION;  Surgeon: Elam Dutch, MD;  Location: [place];  Service: Vascular;  Laterality: Left;   COLONOSCOPY WITH PROPOFOL N/A 06/06/2022   Procedure: COLONOSCOPY WITH PROPOFOL;  Surgeon: Eloise Harman, DO;  [facility];  Service: Endoscopy;  Laterality: N/A;  7:30am, dialysis patient   CORONARY ARTERY BYPASS GRAFT  2019   ESOPHAGOGASTRODUODENOSCOPY (EGD) WITH PROPOFOL N/A 09/16/2021   Procedure: ESOPHAGOGASTRODUODENOSCOPY (EGD) WITH PROPOFOL;  Surgeon: Sharyn Creamer, MD;  Location: [place];  Service: Gastroenterology;  Laterality: N/A;   KNEE SURGERY     PERIPHERAL VASCULAR BALLOON ANGIOPLASTY Left 08/13/2018   Procedure: PERIPHERAL VASCULAR BALLOON ANGIOPLASTY;  Surgeon: Marty Heck, MD;  Location: [place] CV LAB;  Service: Cardiovascular;  Laterality: Left;  central venous    PERIPHERAL VASCULAR BALLOON ANGIOPLASTY Left 01/25/2020   Procedure: PERIPHERAL VASCULAR BALLOON ANGIOPLASTY;  Surgeon: Serafina Mitchell, MD;  Location: [place] CV LAB;  Service: Cardiovascular;  Laterality: Left;  arm fistula   PERIPHERAL VASCULAR CATHETERIZATION Left 01/11/2016   Procedure: A/V Shuntogram/Fistulagram;  Surgeon: Conrad Davy, MD;  Location: [place] CV LAB;  Service: Cardiovascular;  Laterality: Left;   POLYPECTOMY  06/06/2022   Procedure: POLYPECTOMY;  Surgeon: Eloise Harman, DO;  [facility];  Service: Endoscopy;;    Social History   Socioeconomic History   Marital status: Legally Separated    Spouse name: Not on file   Number of children: Not on file   Years of education: Not on file   Highest education level: Not on file  Occupational History   Not on file  Tobacco Use   Smoking status: Every Day    Packs/day: 0.50    Types: Cigarettes   Smokeless tobacco: Never  Vaping Use   Vaping Use: Never used  Substance and Sexual Activity   Alcohol use: No    Alcohol/week: 0.0 standard drinks of alcohol   Drug use: No   Sexual  activity: Not on file  Other Topics Concern   Not on file  Social History Narrative   Not on file   Social Determinants of Health   Financial Resource Strain: Not on file  Food Insecurity: Not on file  Transportation Needs: Not on file  Physical Activity: Not on file  Stress: Not on file  Social Connections: Not on file  Intimate Partner Violence: Not on file    Family History  Problem Relation Age of Onset   Hypertension Father    Stroke Father    Diabetes Mother     No Known Allergies  Current Outpatient Medications  Medication Sig Dispense Refill   acetaminophen (TYLENOL) 500 MG tablet Take 1,000 mg by mouth every 6 (six) hours as needed for moderate pain.     albuterol (VENTOLIN HFA) 108 (90 Base) MCG/ACT inhaler Inhale 2 puffs into the lungs as needed for wheezing or shortness of breath.     amiodarone (PACERONE) 200 MG tablet Take 1 tablet (200 mg total) by mouth daily. 90 tablet 3   apixaban (ELIQUIS) 5 MG TABS tablet Take 1 tablet (5 mg total) by mouth 2 (two) times daily. (Patient taking differently: Take 5 mg by mouth daily.) 60 tablet 1   aspirin EC 81 MG tablet Take 81 mg by mouth daily. Swallow whole.     atorvastatin (LIPITOR) 40 MG tablet Take 40 mg by mouth daily.     diltiazem (CARDIZEM CD) 240 MG 24 hr capsule Take 240 mg by mouth daily.     metoprolol succinate (TOPROL-XL) 50 MG 24 hr tablet Take 50 mg by mouth daily.     midodrine (PROAMATINE) 10 MG tablet Take 1 tablet (10 mg total) by mouth 3 (three) times daily with meals. 90 tablet 3   pantoprazole (PROTONIX) 40 MG tablet Take 1 tablet (40 mg total) by mouth 2 (two) times daily before a meal. 60 tablet 3   isosorbide mononitrate (IMDUR) 30 MG 24 hr tablet Take 1 tablet (30 mg total) by mouth daily. 30 tablet 1   isosorbide mononitrate (IMDUR) 60 MG 24 hr tablet Take 60 mg by mouth daily.     sevelamer carbonate (RENVELA) 800 MG tablet Take 800 mg by mouth in the morning and at bedtime.     No current  facility-administered medications for this visit.    ROS- all systems are reviewed and negative except as per HPI  Physical Exam: Vitals:   07/05/22 1426  BP: 116/78  Pulse: (!) 59  SpO2: 99%  Weight: 201 lb 6.4 oz (91.4 kg)  Height: '5\' 10"'$  (1.778 m)    GEN- The patient is chronically ill appearing, alert and oriented x 3 today.   Head- normocephalic, atraumatic Eyes-  Sclera clear, conjunctiva pink Ears- hearing intact Oropharynx- clear Neck- supple,   Lungs- Clear to ausculation bilaterally, normal work of breathing Chest- ICD pocket is well healed Heart- Regular rate and rhythm, no murmurs, rubs or gallops, PMI not laterally displaced GI- soft, NT, ND, + BS Extremities- no clubbing, cyanosis, or edema MS- no significant deformity or atrophy Skin- no  rash or lesion Psych- euthymic mood, full affect Neuro- strength and sensation are intact  Pacemaker interrogation- reviewed in detail today,  See PACEART report  Assessment and Plan:  History of ventricular tachycardia Started on amiodarone by [facility] reviewed.  I do not see lfts, tfts and will order them today Consider weaning Amiodarone on return if VT remains stable  2. Persistent afib Currently controlled with amiodarone On eliquis I worry about him being on both ASA and eliquis.  I will defer to Dr Harl Bowie but would advise that we stop ASA, particularly in the setting of ESRD.  3. CAD No ischemic symptoms Last echo reviewed  4. ESRD Receives HD MWF   Follow-up with Dr Harl Bowie as scheduled Return in a year Remote monitoring in the interim.  Thompson Grayer MD, [place] 07/05/2022 3:13 PM

## 2022-07-18 LAB — CUP PACEART INCLINIC DEVICE CHECK
Date Time Interrogation Session: 20230720164955
Implantable Lead Implant Date: 20220215
Implantable Lead Location: 753860
Implantable Lead Model: 436909
Implantable Lead Serial Number: 81426396
Implantable Pulse Generator Implant Date: 20220215
Pulse Gen Model: 429525
Pulse Gen Serial Number: 84817163

## 2022-07-19 ENCOUNTER — Encounter (HOSPITAL_COMMUNITY): Payer: Self-pay

## 2022-07-19 ENCOUNTER — Other Ambulatory Visit: Payer: Self-pay

## 2022-07-19 ENCOUNTER — Emergency Department (HOSPITAL_COMMUNITY): Payer: Medicare Other

## 2022-07-19 ENCOUNTER — Emergency Department (HOSPITAL_COMMUNITY)
Admission: EM | Admit: 2022-07-19 | Discharge: 2022-07-19 | Disposition: A | Discharge status: Home / Self Care | Payer: Medicare Other | Attending: Emergency Medicine | Admitting: Emergency Medicine

## 2022-07-19 DIAGNOSIS — Z79899 Other long term (current) drug therapy: Secondary | ICD-10-CM | POA: Insufficient documentation

## 2022-07-19 DIAGNOSIS — Z7901 Long term (current) use of anticoagulants: Secondary | ICD-10-CM | POA: Diagnosis not present

## 2022-07-19 DIAGNOSIS — Z955 Presence of coronary angioplasty implant and graft: Secondary | ICD-10-CM | POA: Insufficient documentation

## 2022-07-19 DIAGNOSIS — R079 Chest pain, unspecified: Secondary | ICD-10-CM

## 2022-07-19 DIAGNOSIS — E119 Type 2 diabetes mellitus without complications: Secondary | ICD-10-CM | POA: Diagnosis not present

## 2022-07-19 DIAGNOSIS — N186 End stage renal disease: Secondary | ICD-10-CM | POA: Diagnosis not present

## 2022-07-19 DIAGNOSIS — I2510 Atherosclerotic heart disease of native coronary artery without angina pectoris: Secondary | ICD-10-CM | POA: Diagnosis not present

## 2022-07-19 DIAGNOSIS — I120 Hypertensive chronic kidney disease with stage 5 chronic kidney disease or end stage renal disease: Secondary | ICD-10-CM | POA: Insufficient documentation

## 2022-07-19 DIAGNOSIS — Z992 Dependence on renal dialysis: Secondary | ICD-10-CM | POA: Diagnosis not present

## 2022-07-19 DIAGNOSIS — Z7982 Long term (current) use of aspirin: Secondary | ICD-10-CM | POA: Insufficient documentation

## 2022-07-19 DIAGNOSIS — R072 Precordial pain: Secondary | ICD-10-CM | POA: Insufficient documentation

## 2022-07-19 DIAGNOSIS — I4891 Unspecified atrial fibrillation: Secondary | ICD-10-CM | POA: Insufficient documentation

## 2022-07-19 LAB — CBC
HCT: 38.1 % — ABNORMAL LOW (ref 39.0–52.0)
Hemoglobin: 11.6 g/dL — ABNORMAL LOW (ref 13.0–17.0)
MCH: 26.1 pg (ref 26.0–34.0)
MCHC: 30.4 g/dL (ref 30.0–36.0)
MCV: 85.8 fL (ref 80.0–100.0)
Platelets: 192 10*3/uL (ref 150–400)
RBC: 4.44 MIL/uL (ref 4.22–5.81)
RDW: 18.6 % — ABNORMAL HIGH (ref 11.5–15.5)
WBC: 8.6 10*3/uL (ref 4.0–10.5)
nRBC: 0 % (ref 0.0–0.2)

## 2022-07-19 LAB — BASIC METABOLIC PANEL
Anion gap: 11 (ref 5–15)
BUN: 39 mg/dL — ABNORMAL HIGH (ref 6–20)
CO2: 27 mmol/L (ref 22–32)
Calcium: 9.7 mg/dL (ref 8.9–10.3)
Chloride: 102 mmol/L (ref 98–111)
Creatinine, Ser: 10.53 mg/dL — ABNORMAL HIGH (ref 0.61–1.24)
GFR, Estimated: 5 mL/min — ABNORMAL LOW (ref >60)
Glucose, Bld: 118 mg/dL — ABNORMAL HIGH (ref 70–99)
Potassium: 4.9 mmol/L (ref 3.5–5.1)
Sodium: 140 mmol/L (ref 135–145)

## 2022-07-19 LAB — TROPONIN I (HIGH SENSITIVITY)
Troponin I (High Sensitivity): 25 ng/L — ABNORMAL HIGH (ref <18)
Troponin I (High Sensitivity): 24 ng/L — ABNORMAL HIGH (ref <18)

## 2022-07-19 NOTE — Discharge Instructions (Signed)
Your work-up today was reassuring.  You will need to have your dialysis treatment tomorrow.  You have an appointment tomorrow for your dialysis treatment at [facility] in [place] at 7 AM.  Please contact your cardiologist listed to arrange a follow-up appointment for next week.

## 2022-07-19 NOTE — ED Provider Notes (Signed)
[facility] EMERGENCY DEPARTMENT Provider Note   CSN: [id number] Arrival date & time: 07/19/22  9211     History {Add pertinent medical, surgical, social history, OB history to HPI:1} Chief Complaint  Patient presents with   Chest Pain    Shaun Burns is a 51 y.o. male.   Chest Pain      Home Medications Prior to Admission medications   Medication Sig Start Date End Date Taking? Authorizing Provider  acetaminophen (TYLENOL) 500 MG tablet Take 1,000 mg by mouth every 6 (six) hours as needed for moderate pain.    [provider]  albuterol (VENTOLIN HFA) 108 (90 Base) MCG/ACT inhaler Inhale 2 puffs into the lungs as needed for wheezing or shortness of breath.    [provider]  amiodarone (PACERONE) 200 MG tablet Take 1 tablet (200 mg total) by mouth daily. 01/21/22   Satira Sark, MD  apixaban (ELIQUIS) 5 MG TABS tablet Take 1 tablet (5 mg total) by mouth 2 (two) times daily. Patient taking differently: Take 5 mg by mouth daily. 09/18/21   Nita Sells, MD  aspirin EC 81 MG tablet Take 81 mg by mouth daily. Swallow whole.    [provider]  atorvastatin (LIPITOR) 40 MG tablet Take 40 mg by mouth daily. 09/12/21   [provider]  diltiazem (CARDIZEM CD) 240 MG 24 hr capsule Take 240 mg by mouth daily. 09/12/21   [provider]  isosorbide mononitrate (IMDUR) 30 MG 24 hr tablet Take 1 tablet (30 mg total) by mouth daily. 04/22/22   Evlyn Courier, PA-C  isosorbide mononitrate (IMDUR) 60 MG 24 hr tablet Take 60 mg by mouth daily.    [provider]  metoprolol succinate (TOPROL-XL) 50 MG 24 hr tablet Take 50 mg by mouth daily. 07/22/21   [provider]  midodrine (PROAMATINE) 10 MG tablet Take 1 tablet (10 mg total) by mouth 3 (three) times daily with meals. 09/18/21   Nita Sells, MD  pantoprazole (PROTONIX) 40 MG tablet Take 1 tablet (40 mg total) by mouth 2 (two) times daily before a meal. 09/18/21    Nita Sells, MD  sevelamer carbonate (RENVELA) 800 MG tablet Take 800 mg by mouth in the morning and at bedtime. 10/12/20   [provider]      Allergies    Patient has no known allergies.    Review of Systems   Review of Systems  Cardiovascular:  Positive for chest pain.    Physical Exam Updated Vital Signs BP (!) 141/74   Pulse 61   Temp 98.4 F (36.9 C) (Oral)   Resp 14   Ht '5\' 10"'$  (1.778 m)   Wt 93 kg   SpO2 99%   BMI 29.41 kg/m  Physical Exam  ED Results / Procedures / Treatments   Labs (all labs ordered are listed, but only abnormal results are displayed) Labs Reviewed  BASIC METABOLIC PANEL - Abnormal; Notable for the following components:      Result Value   Glucose, Bld 118 (*)    BUN 39 (*)    Creatinine, Ser 10.53 (*)    GFR, Estimated 5 (*)    All other components within normal limits  CBC - Abnormal; Notable for the following components:   Hemoglobin 11.6 (*)    HCT 38.1 (*)    RDW 18.6 (*)    All other components within normal limits  TROPONIN I (HIGH SENSITIVITY) - Abnormal; Notable for the following components:  Troponin I (High Sensitivity) 25 (*)    All other components within normal limits  TROPONIN I (HIGH SENSITIVITY) - Abnormal; Notable for the following components:   Troponin I (High Sensitivity) 24 (*)    All other components within normal limits    EKG EKG Interpretation  Date/Time:  Friday July 19 2022 08:41:48 EDT Ventricular Rate:  70 PR Interval:  189 QRS Duration: 123 QT Interval:  454 QTC Calculation: 490 R Axis:   6 Text Interpretation: Sinus rhythm Probable left atrial enlargement Left bundle branch block Confirmed by Campbell Stall ([id number]) on 04/01/4741 11:16:10 AM  Radiology DG Chest Port 1 View  Result Date: 07/19/2022 CLINICAL DATA:  Chest pain. EXAM: PORTABLE CHEST 1 VIEW COMPARISON:  05/10/2001 FINDINGS: Stable top-normal heart size. Status post prior CABG and placement single lead ICD. There is no  evidence of pulmonary edema, consolidation, pneumothorax, nodule or pleural fluid. IMPRESSION: No active disease. Stable top-normal heart size and appearance of single lead ICD. Electronically Signed   By: Aletta Edouard M.D.   On: 07/19/2022 09:36    Procedures Procedures  {Document cardiac monitor, telemetry assessment procedure when appropriate:1}  Medications Ordered in ED Medications - No data to display  ED Course/ Medical Decision Making/ A&P                           Medical Decision Making Amount and/or Complexity of Data Reviewed Labs: ordered. Discussion of management or test interpretation with external provider(s): On recheck, patient sleeping, easily aroused.  States he is symptom-free at present.  No longer having chest pain. He missed his dialysis today, will we contacted [facility] dialysis in [facility] and patient has an appointment tomorrow morning at 7 AM.     {Document critical care time when appropriate:1} {Document review of labs and clinical decision tools ie heart score, Chads2Vasc2 etc:1}  {Document your independent review of radiology images, and any outside records:1} {Document your discussion with family members, caretakers, and with consultants:1} {Document social determinants of health affecting pt's care:1} {Document your decision making why or why not admission, treatments were needed:1} Final Clinical Impression(s) / ED Diagnoses Final diagnoses:  None    Rx / DC Orders ED Discharge Orders     None

## 2022-07-19 NOTE — ED Triage Notes (Signed)
Pt went to dialysis this morning and nurse sent pt to ED because pt has had chest pain since last night.

## 2022-07-22 ENCOUNTER — Telehealth: Payer: Self-pay

## 2022-07-22 NOTE — Telephone Encounter (Signed)
A nurse from [facility] called stating they have to call the patient before releasing the patient. She called the patient but he did not answer.

## 2022-10-15 ENCOUNTER — Ambulatory Visit: Payer: Medicare Other | Attending: Cardiology | Admitting: Cardiology

## 2022-10-15 ENCOUNTER — Encounter: Payer: Self-pay | Admitting: *Deleted

## 2022-10-15 ENCOUNTER — Encounter: Payer: Self-pay | Admitting: Cardiology

## 2022-10-15 VITALS — BP 135/75 | HR 61 | Ht 70.0 in | Wt 201.6 lb

## 2022-10-15 DIAGNOSIS — I472 Ventricular tachycardia, unspecified: Secondary | ICD-10-CM | POA: Insufficient documentation

## 2022-10-15 DIAGNOSIS — I2510 Atherosclerotic heart disease of native coronary artery without angina pectoris: Secondary | ICD-10-CM | POA: Insufficient documentation

## 2022-10-15 DIAGNOSIS — I4891 Unspecified atrial fibrillation: Secondary | ICD-10-CM | POA: Insufficient documentation

## 2022-10-15 DIAGNOSIS — D6869 Other thrombophilia: Secondary | ICD-10-CM | POA: Insufficient documentation

## 2022-10-15 NOTE — Patient Instructions (Signed)
Medication Instructions:  Stop Aspirin Continue all other medications.     Labwork: none  Testing/Procedures: none  Follow-Up: 6 months   Any Other Special Instructions Will Be Listed Below (If Applicable).   If you need a refill on your cardiac medications before your next appointment, please call your pharmacy.  

## 2022-10-15 NOTE — Progress Notes (Signed)
Clinical Summary Shaun Burns is a 51 y.o.male  CAD with CABG in 2019 - Last cath in 01/2021 showed occluded SVG to RCA and severe mid LAD lesion after anastomosis of the LIMA to LAD which was not intervened upon due to technical difficulties at [facility]. - some chest pains at times when he has too much fluid on him.  -compliant with meds    - some chest pains at times, improves with fluid revmoval.Has been a normal pattern for him for many years. No exertional symptoms.  - compliant with meds   2. ESRD - chronic low bp's on HD - he is on midodrine   - MWF HD  - no recent issues with low bp's during HD   3. HTN     4. Afib   - infrequent palpitaitons.  - no bleeding on eliquis   5. Anemia EGD on 09/16/21 showed erosive gastropathy with no stigmata of recent bleeding. Patient declined colonoscopy.Tranfused 1 unit during that admission - recent bleeding. Still taking eliquis.      6. History of VT now with ICD - has been on amio - recent device check was normal, upcoming check later this week.    7. Hypotension - on midodrine   8. GERD - followed by GI    4 kids ages  27,18,16, 30 Past Medical History:  Diagnosis Date   AICD (automatic cardioverter/defibrillator) present    CAD (coronary artery disease)    a. s/p CABG in 2019 with LIMA-LAD-D1 and SVG-PDA b. cath 01/2021 [facility] showing CTO mLAD unable to be crossed with wire, diffusely diseased small circ, LIMA-D2-LAD patent, SVG-RCA occluded, and CTO RCA with L-->R collaterals and medical management was recommended   Dysrhythmia    ESRD on hemodialysis [facility])    Essential hypertension    GERD (gastroesophageal reflux disease)    Headache    History of GI bleed    Ischemic cardiomyopathy    Biotronik single chamber ICD (VDD lead) - February 2022 [facility]   Myocardial infarction [facility]) 01/2021   Sustained VT (ventricular tachycardia) ([place])    February 2022   Type 2 diabetes mellitus (HCC)      No Known  Allergies   Current Outpatient Medications  Medication Sig Dispense Refill   acetaminophen (TYLENOL) 500 MG tablet Take 1,000 mg by mouth every 6 (six) hours as needed for moderate pain.     albuterol (VENTOLIN HFA) 108 (90 Base) MCG/ACT inhaler Inhale 2 puffs into the lungs as needed for wheezing or shortness of breath.     amiodarone (PACERONE) 200 MG tablet Take 1 tablet (200 mg total) by mouth daily. 90 tablet 3   apixaban (ELIQUIS) 5 MG TABS tablet Take 1 tablet (5 mg total) by mouth 2 (two) times daily. (Patient taking differently: Take 5 mg by mouth daily.) 60 tablet 1   aspirin EC 81 MG tablet Take 81 mg by mouth daily. Swallow whole.     atorvastatin (LIPITOR) 40 MG tablet Take 40 mg by mouth daily.     diltiazem (CARDIZEM CD) 240 MG 24 hr capsule Take 240 mg by mouth daily.     isosorbide mononitrate (IMDUR) 30 MG 24 hr tablet Take 1 tablet (30 mg total) by mouth daily. 30 tablet 1   isosorbide mononitrate (IMDUR) 60 MG 24 hr tablet Take 60 mg by mouth daily.     metoprolol succinate (TOPROL-XL) 50 MG 24 hr tablet Take 50 mg by mouth daily.  midodrine (PROAMATINE) 10 MG tablet Take 1 tablet (10 mg total) by mouth 3 (three) times daily with meals. 90 tablet 3   pantoprazole (PROTONIX) 40 MG tablet Take 1 tablet (40 mg total) by mouth 2 (two) times daily before a meal. 60 tablet 3   sevelamer carbonate (RENVELA) 800 MG tablet Take 800 mg by mouth in the morning and at bedtime.     No current facility-administered medications for this visit.     Past Surgical History:  Procedure Laterality Date   A/V FISTULAGRAM N/A 08/13/2018   Procedure: A/V FISTULAGRAM;  Surgeon: Marty Heck, MD;  Location: [place] [facility];  Service: Cardiovascular;  Laterality: N/A;   A/V FISTULAGRAM N/A 01/25/2020   Procedure: A/V FISTULAGRAM - Left Arm;  Surgeon: Serafina Mitchell, MD;  Location: [place] [facility];  Service: Cardiovascular;  Laterality: N/A;   AV FISTULA PLACEMENT Left  03/30/2015   Procedure: LEFT BRACHIOCEPHALIC ARTERIOVENOUS (AV) FISTULA CREATION;  Surgeon: Elam Dutch, MD;  Location: [place];  Service: Vascular;  Laterality: Left;   COLONOSCOPY WITH PROPOFOL N/A 06/06/2022   Procedure: COLONOSCOPY WITH PROPOFOL;  Surgeon: Eloise Harman, DO;  [facility];  Service: Endoscopy;  Laterality: N/A;  7:30am, dialysis patient   CORONARY ARTERY BYPASS GRAFT  2019   ESOPHAGOGASTRODUODENOSCOPY (EGD) WITH PROPOFOL N/A 09/16/2021   Procedure: ESOPHAGOGASTRODUODENOSCOPY (EGD) WITH PROPOFOL;  Surgeon: Sharyn Creamer, MD;  Location: [place];  Service: Gastroenterology;  Laterality: N/A;   KNEE SURGERY     PERIPHERAL VASCULAR BALLOON ANGIOPLASTY Left 08/13/2018   Procedure: PERIPHERAL VASCULAR BALLOON ANGIOPLASTY;  Surgeon: Marty Heck, MD;  Location: [place] [facility];  Service: Cardiovascular;  Laterality: Left;  central venous    PERIPHERAL VASCULAR BALLOON ANGIOPLASTY Left 01/25/2020   Procedure: PERIPHERAL VASCULAR BALLOON ANGIOPLASTY;  Surgeon: Serafina Mitchell, MD;  Location: [place] [facility];  Service: Cardiovascular;  Laterality: Left;  arm fistula   PERIPHERAL VASCULAR CATHETERIZATION Left 01/11/2016   Procedure: A/V Shuntogram/Fistulagram;  Surgeon: Conrad [place] Hall, MD;  Location: [place] [facility];  Service: Cardiovascular;  Laterality: Left;   POLYPECTOMY  06/06/2022   Procedure: POLYPECTOMY;  Surgeon: Eloise Harman, DO;  [facility];  Service: Endoscopy;;     No Known Allergies    Family History  Problem Relation Age of Onset   Hypertension Father    Stroke Father    Diabetes Mother      Social History Shaun Burns reports that he has been smoking cigarettes. He has been smoking an average of .5 packs per day. He has never used smokeless tobacco. Shaun Burns reports that he does not currently use alcohol.   Review of Systems CONSTITUTIONAL: No weight loss, fever, chills, weakness or fatigue.  HEENT:  Eyes: No visual loss, blurred vision, double vision or yellow sclerae.No hearing loss, sneezing, congestion, runny nose or sore throat.  SKIN: No rash or itching.  CARDIOVASCULAR: per hpi RESPIRATORY: No shortness of breath, cough or sputum.  GASTROINTESTINAL: No anorexia, nausea, vomiting or diarrhea. No abdominal pain or blood.  GENITOURINARY: No burning on urination, no polyuria NEUROLOGICAL: No headache, dizziness, syncope, paralysis, ataxia, numbness or tingling in the extremities. No change in bowel or bladder control.  MUSCULOSKELETAL: No muscle, back pain, joint pain or stiffness.  LYMPHATICS: No enlarged nodes. No history of splenectomy.  PSYCHIATRIC: No history of depression or anxiety.  ENDOCRINOLOGIC: No reports of sweating, cold or heat intolerance. No polyuria or polydipsia.  Marland Kitchen  Physical Examination Today's Vitals   10/15/22 1356  BP: (!) 148/82  Pulse: 61  SpO2: 98%  Weight: 201 lb 9.6 oz (91.4 kg)  Height: '5\' 10"'$  (1.778 m)   Body mass index is 28.93 kg/m.  Gen: resting comfortably, no acute distress HEENT: no scleral icterus, pupils equal round and reactive, no palptable cervical adenopathy,  CV: RRR, no m/r/g no jvd Resp: Clear to auscultation bilaterally GI: abdomen is soft, non-tender, non-distended, normal bowel sounds, no hepatosplenomegaly MSK: extremities are warm, no edema.  Skin: warm, no rash Neuro:  no focal deficits Psych: appropriate affect      Assessment and Plan  1.CAD - cath as reported above - no recent exertional symptoms, chronic chest when has extra fluid which resolves with HD - stop ASA, on eliquis.    2. Afib./acquired thormbophilia - no symptoms, continue current meds including eliquis for stroke prevention   3. Hx of VT - has ICD, he is on amio started at [facility] - continue to follow with EP, has ICD remote check later this week.       Arnoldo Lenis, M.D.

## 2022-12-09 IMAGING — DX DG CHEST 2V
2 series · 2 of 2 positions shown · non-contrast
Comparison: 04/22/2022

CLINICAL DATA: Chest pain since last night

EXAM:
CHEST - 2 VIEW

[chest pa]
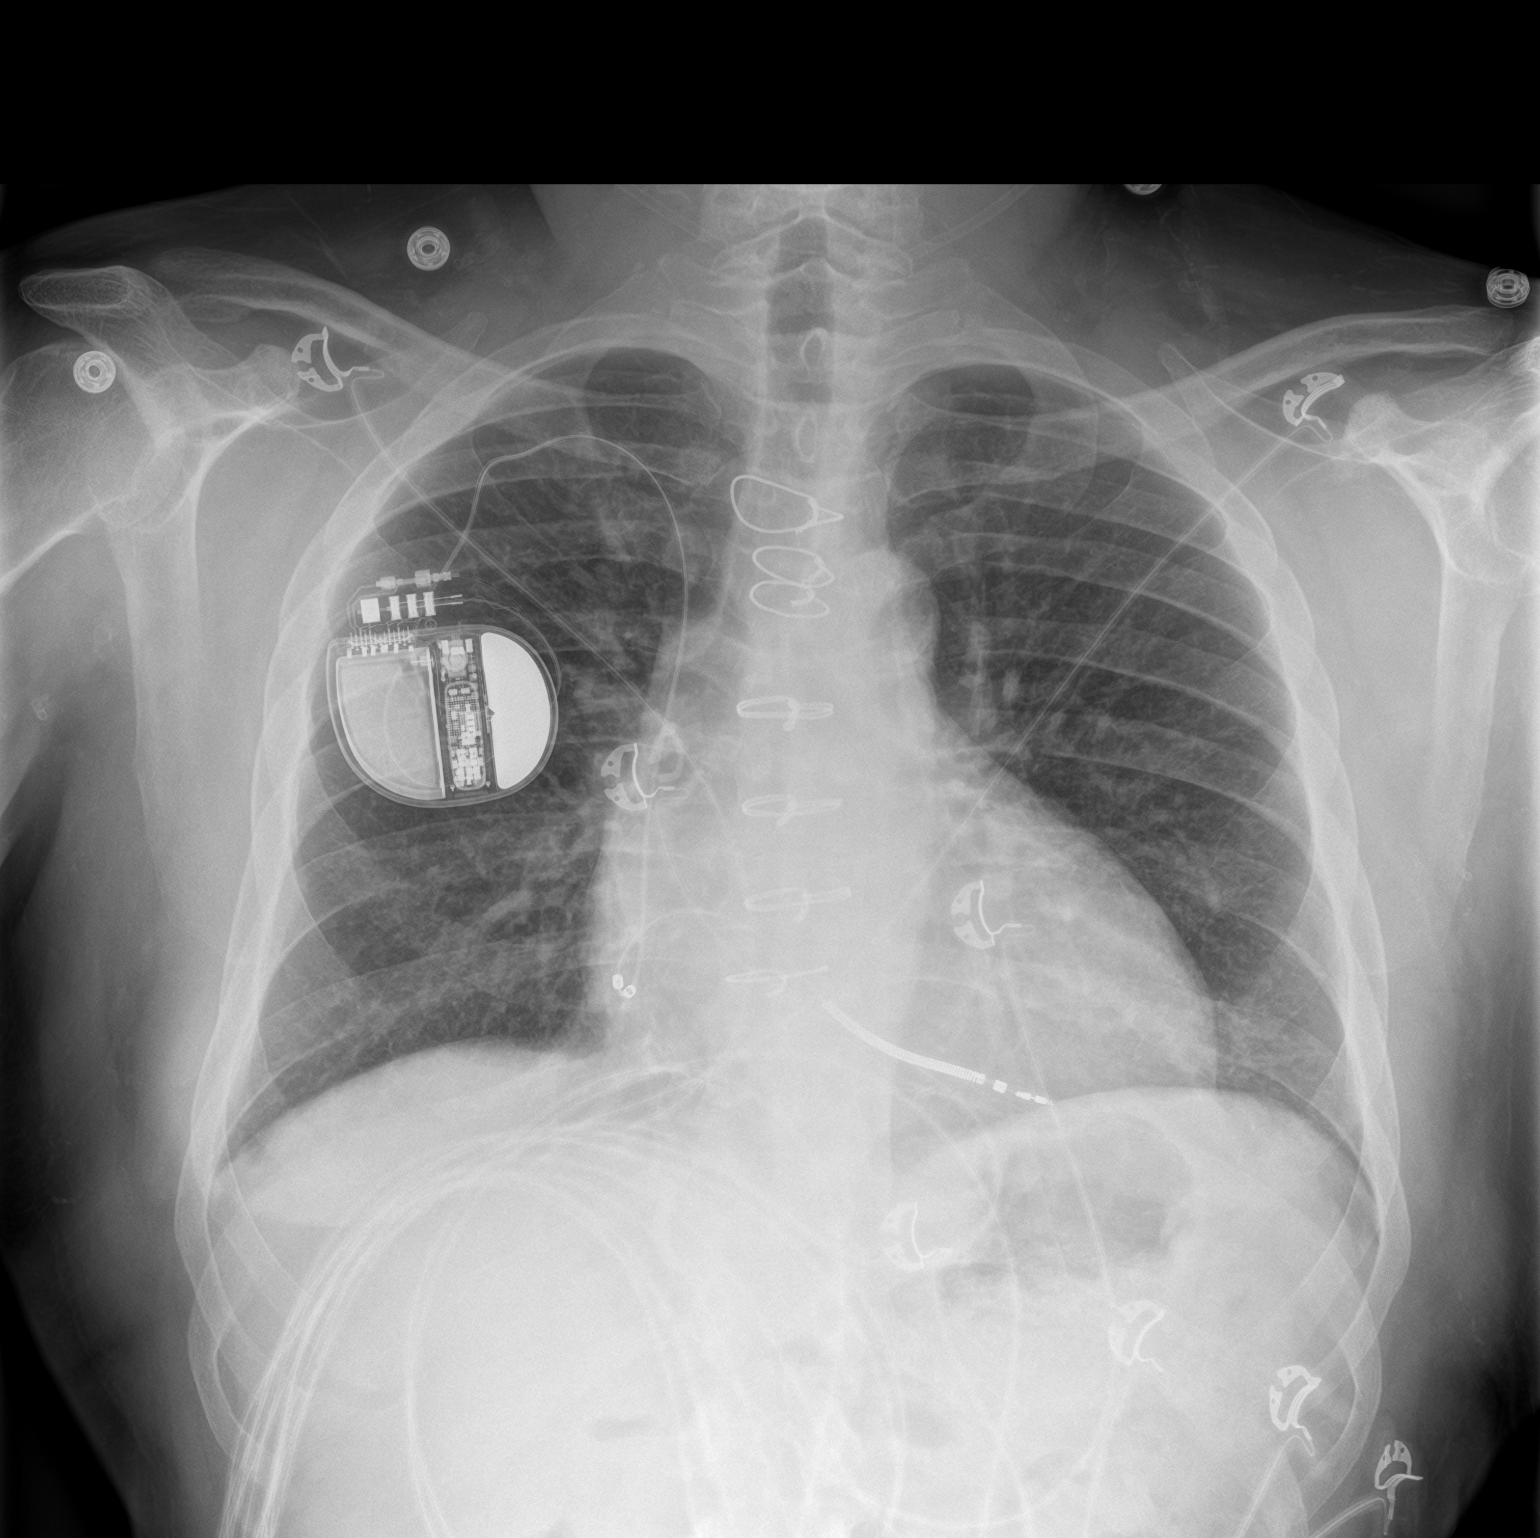

[chest lat]
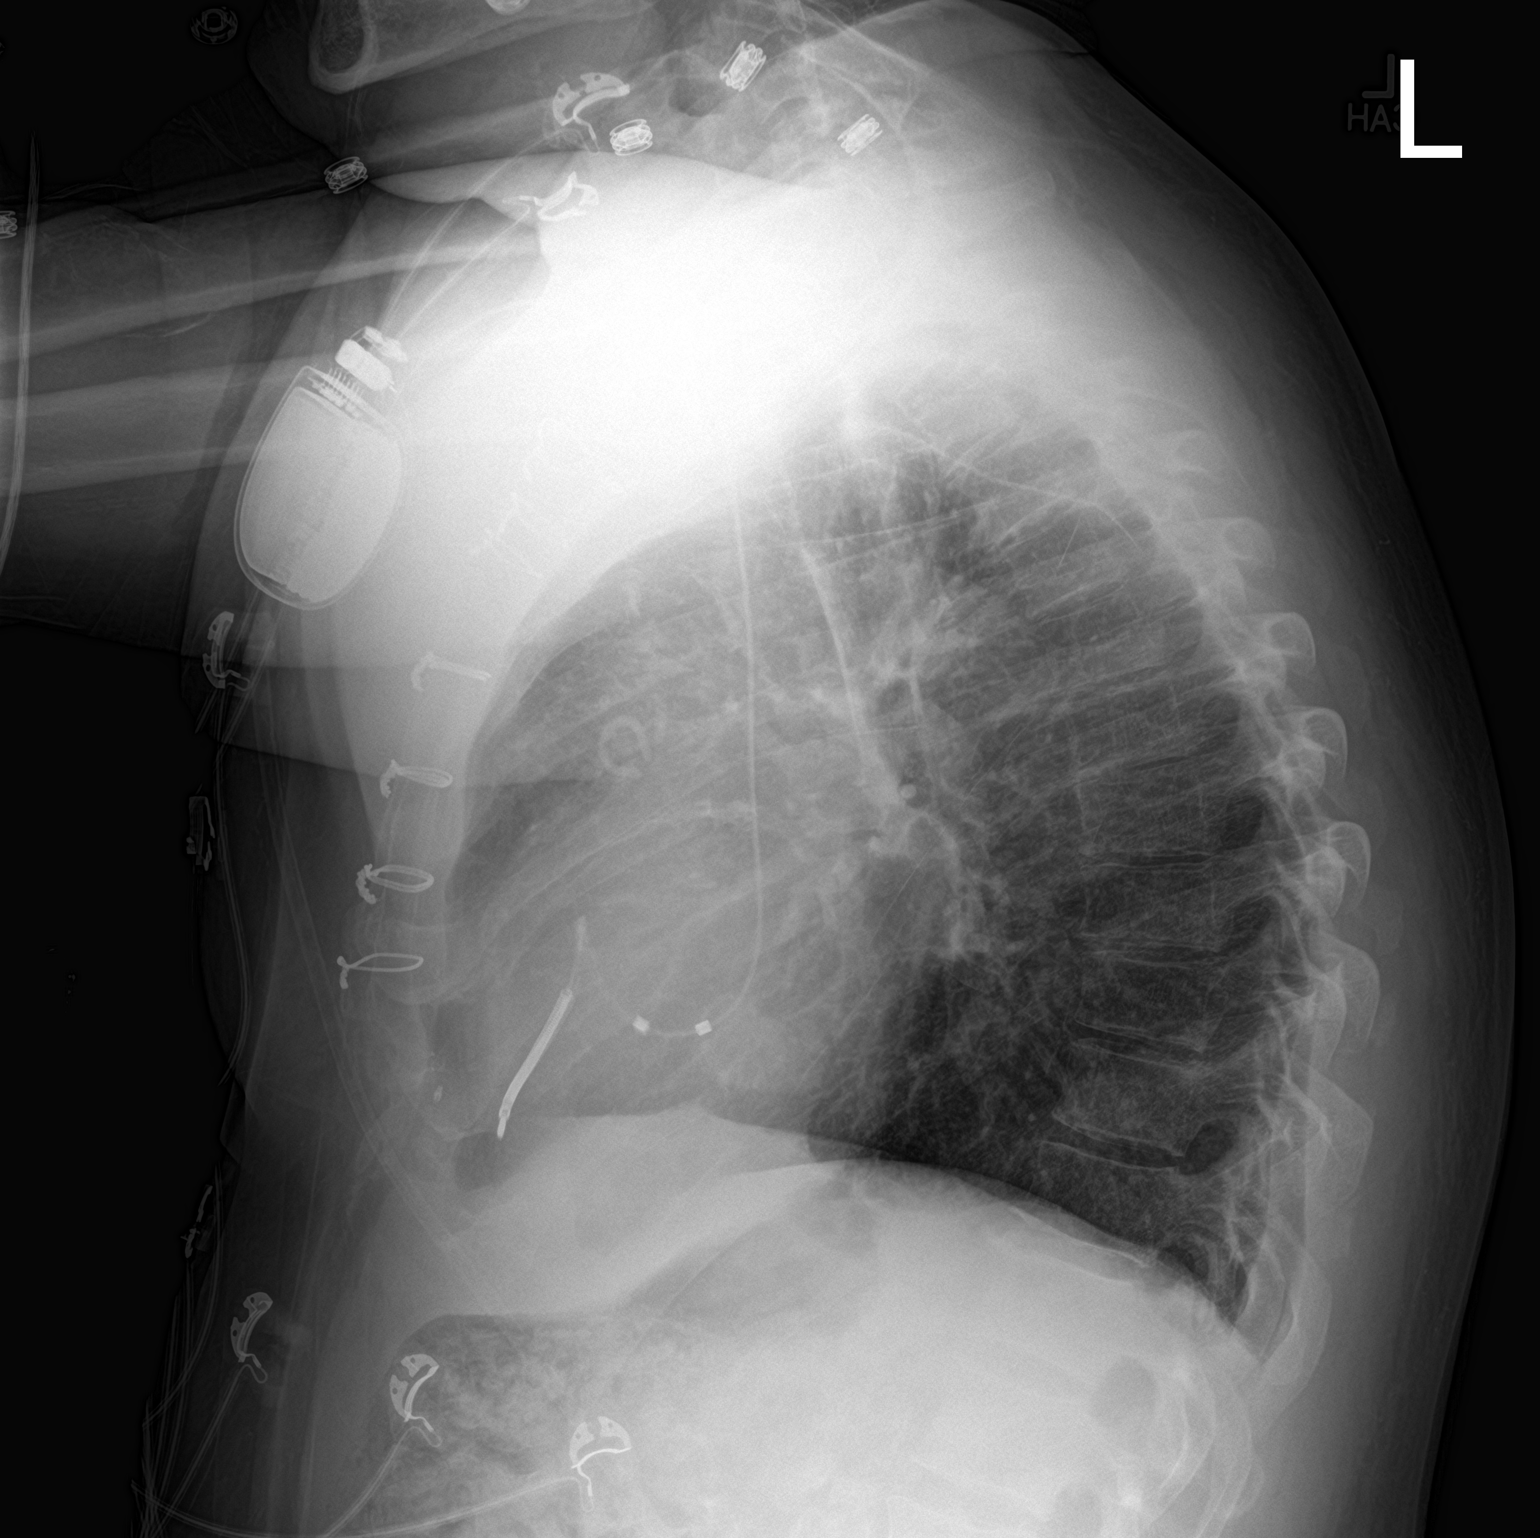

[2 of 2 positions shown; findings below may reference images not displayed]

FINDINGS: RIGHT subclavian ICD lead projects at RIGHT ventricle.

Borderline enlargement of cardiac silhouette post median sternotomy
and CABG.

Mediastinal contours and pulmonary vascularity normal.

Lungs clear.

No infiltrate, pleural effusion, or pneumothorax.

Atherosclerotic calcifications aorta.

Osseous structures unremarkable.
IMPRESSION: Post CABG and ICD.

No acute abnormalities.

## 2023-01-14 ENCOUNTER — Other Ambulatory Visit: Payer: Self-pay | Admitting: Cardiology

## 2023-02-28 DEATH — deceased

## 2023-03-30 ENCOUNTER — Other Ambulatory Visit: Payer: Self-pay | Admitting: Cardiology

## 2023-04-01 ENCOUNTER — Ambulatory Visit: Payer: Medicare Other | Admitting: Cardiology

## 2023-04-01 NOTE — Progress Notes (Deleted)
Clinical Summary Shaun Burns is a 52 y.o.male  CAD with CABG in 2019 - Last cath in 01/2021 showed occluded SVG to RCA and severe mid LAD lesion after anastomosis of the LIMA to LAD which was not intervened upon due to technical difficulties at [facility]. - some chest pains at times when he has too much fluid on him.  -compliant with meds     - some chest pains at times, improves with fluid revmoval.Has been a normal pattern for him for many years. No exertional symptoms.  - compliant with meds   -admit 01/2023 [facility] with NSTEMI, cardiac arrest, cardiogenic shock - troponin peaked at approximately 18,000  - Underwent LHC on 02/05/2023, which showed severe native mvCAD, LIMA-D2 graft open, but occluded distal to LAD anastomosis. D2 is patent with competitive antegrade flow. SVG-PDA known occluded.  - Following the LHC, patient developed worsening hypotension and left arm pain (anginal equivalent in this patient). Concern for cardiogenic shock given vital signs and exam, which showed cool extremities and volume overload  - later admit PEA arrest, followed by VT/VF requiring defib -    2. ESRD - chronic low bp's on HD - he is on midodrine   - MWF HD   - no recent issues with low bp's during HD   3. HTN     4. Afib    - infrequent palpitaitons.  - no bleeding on eliquis   5. Anemia EGD on 09/16/21 showed erosive gastropathy with no stigmata of recent bleeding. Patient declined colonoscopy.Tranfused 1 unit during that admission - recent bleeding. Still taking eliquis.      6. History of VT now with ICD - has been on amio - recent device check was normal, upcoming check later this week.    7. Hypotension - on midodrine   8. GERD - followed by GI       4 kids ages  27,18,16, 70 Past Medical History:  Diagnosis Date   AICD (automatic cardioverter/defibrillator) present    CAD (coronary artery disease)    a. s/p CABG in 2019 with LIMA-LAD-D1 and SVG-PDA b.  cath 01/2021 [facility] showing CTO mLAD unable to be crossed with wire, diffusely diseased small circ, LIMA-D2-LAD patent, SVG-RCA occluded, and CTO RCA with L-->R collaterals and medical management was recommended   Dysrhythmia    ESRD on hemodialysis [facility])    Essential hypertension    GERD (gastroesophageal reflux disease)    Headache    History of GI bleed    Ischemic cardiomyopathy    Biotronik single chamber ICD (VDD lead) - February 2022 [facility]   Myocardial infarction [facility]) 01/2021   Sustained VT (ventricular tachycardia) ([place])    February 2022   Type 2 diabetes mellitus (HCC)      No Known Allergies   Current Outpatient Medications  Medication Sig Dispense Refill   acetaminophen (TYLENOL) 500 MG tablet Take 1,000 mg by mouth every 6 (six) hours as needed for moderate pain.     albuterol (VENTOLIN HFA) 108 (90 Base) MCG/ACT inhaler Inhale 2 puffs into the lungs as needed for wheezing or shortness of breath.     amiodarone (PACERONE) 200 MG tablet TAKE 1 TABLET BY MOUTH EVERY DAY 90 tablet 3   apixaban (ELIQUIS) 5 MG TABS tablet Take 5 mg by mouth daily.     atorvastatin (LIPITOR) 40 MG tablet Take 40 mg by mouth daily.     diltiazem (CARDIZEM CD) 240 MG 24 hr capsule  Take 240 mg by mouth daily.     isosorbide mononitrate (IMDUR) 30 MG 24 hr tablet Take 1 tablet (30 mg total) by mouth daily. 30 tablet 1   isosorbide mononitrate (IMDUR) 60 MG 24 hr tablet Take 60 mg by mouth daily.     metoprolol succinate (TOPROL-XL) 50 MG 24 hr tablet Take 50 mg by mouth daily.     midodrine (PROAMATINE) 10 MG tablet Take 1 tablet (10 mg total) by mouth 3 (three) times daily with meals. (Patient taking differently: Take 10 mg by mouth 2 (two) times daily.) 90 tablet 3   pantoprazole (PROTONIX) 40 MG tablet Take 1 tablet (40 mg total) by mouth 2 (two) times daily before a meal. 60 tablet 3   sevelamer carbonate (RENVELA) 800 MG tablet Take 800 mg by mouth in the morning and at bedtime.     No  current facility-administered medications for this visit.     Past Surgical History:  Procedure Laterality Date   A/V FISTULAGRAM N/A 08/13/2018   Procedure: A/V FISTULAGRAM;  Surgeon: Marty Heck, MD;  Location: [place] [facility];  Service: Cardiovascular;  Laterality: N/A;   A/V FISTULAGRAM N/A 01/25/2020   Procedure: A/V FISTULAGRAM - Left Arm;  Surgeon: Serafina Mitchell, MD;  Location: [place] [facility];  Service: Cardiovascular;  Laterality: N/A;   AV FISTULA PLACEMENT Left 03/30/2015   Procedure: LEFT BRACHIOCEPHALIC ARTERIOVENOUS (AV) FISTULA CREATION;  Surgeon: Elam Dutch, MD;  Location: [place];  Service: Vascular;  Laterality: Left;   COLONOSCOPY WITH PROPOFOL N/A 06/06/2022   Procedure: COLONOSCOPY WITH PROPOFOL;  Surgeon: Eloise Harman, DO;  [facility];  Service: Endoscopy;  Laterality: N/A;  7:30am, dialysis patient   CORONARY ARTERY BYPASS GRAFT  2019   ESOPHAGOGASTRODUODENOSCOPY (EGD) WITH PROPOFOL N/A 09/16/2021   Procedure: ESOPHAGOGASTRODUODENOSCOPY (EGD) WITH PROPOFOL;  Surgeon: Sharyn Creamer, MD;  Location: [place];  Service: Gastroenterology;  Laterality: N/A;   KNEE SURGERY     PERIPHERAL VASCULAR BALLOON ANGIOPLASTY Left 08/13/2018   Procedure: PERIPHERAL VASCULAR BALLOON ANGIOPLASTY;  Surgeon: Marty Heck, MD;  Location: [place] [facility];  Service: Cardiovascular;  Laterality: Left;  central venous    PERIPHERAL VASCULAR BALLOON ANGIOPLASTY Left 01/25/2020   Procedure: PERIPHERAL VASCULAR BALLOON ANGIOPLASTY;  Surgeon: Serafina Mitchell, MD;  Location: [place] [facility];  Service: Cardiovascular;  Laterality: Left;  arm fistula   PERIPHERAL VASCULAR CATHETERIZATION Left 01/11/2016   Procedure: A/V Shuntogram/Fistulagram;  Surgeon: Conrad Pass[place], MD;  Location: [place] [facility];  Service: Cardiovascular;  Laterality: Left;   POLYPECTOMY  06/06/2022   Procedure: POLYPECTOMY;  Surgeon: Eloise Harman, DO;  [facility];  Service: Endoscopy;;     No Known Allergies    Family History  Problem Relation Age of Onset   Hypertension Father    Stroke Father    Diabetes Mother      Social History Shaun Burns reports that he has been smoking cigarettes. He has been smoking an average of .5 packs per day. He has never been exposed to tobacco smoke. He has never used smokeless tobacco. Shaun Burns reports that he does not currently use alcohol.   Review of Systems CONSTITUTIONAL: No weight loss, fever, chills, weakness or fatigue.  HEENT: Eyes: No visual loss, blurred vision, double vision or yellow sclerae.No hearing loss, sneezing, congestion, runny nose or sore throat.  SKIN: No rash or itching.  CARDIOVASCULAR:  RESPIRATORY: No shortness of  breath, cough or sputum.  GASTROINTESTINAL: No anorexia, nausea, vomiting or diarrhea. No abdominal pain or blood.  GENITOURINARY: No burning on urination, no polyuria NEUROLOGICAL: No headache, dizziness, syncope, paralysis, ataxia, numbness or tingling in the extremities. No change in bowel or bladder control.  MUSCULOSKELETAL: No muscle, back pain, joint pain or stiffness.  LYMPHATICS: No enlarged nodes. No history of splenectomy.  PSYCHIATRIC: No history of depression or anxiety.  ENDOCRINOLOGIC: No reports of sweating, cold or heat intolerance. No polyuria or polydipsia.  Marland Kitchen   Physical Examination There were no vitals filed for this visit. There were no vitals filed for this visit.  Gen: resting comfortably, no acute distress HEENT: no scleral icterus, pupils equal round and reactive, no palptable cervical adenopathy,  CV Resp: Clear to auscultation bilaterally GI: abdomen is soft, non-tender, non-distended, normal bowel sounds, no hepatosplenomegaly MSK: extremities are warm, no edema.  Skin: warm, no rash Neuro:  no focal deficits Psych: appropriate affect   Diagnostic Studies     Assessment and Plan  1.CAD - cath as reported above -  no recent exertional symptoms, chronic chest when has extra fluid which resolves with HD - stop ASA, on eliquis.    2. Afib./acquired thormbophilia - no symptoms, continue current meds including eliquis for stroke prevention   3. Hx of VT - has ICD, he is on amio started at [facility] - continue to follow with EP, has ICD remote check later this week.       Arnoldo Lenis, M.D., F.A.C.C.
